# Patient Record
Sex: Female | Born: 1940 | Race: White | Hispanic: No | Marital: Married | Smoking: Never smoker
Health system: Southern US, Community
[De-identification: ages and names within clinical notes are randomized; demographics above are authoritative.]

## PROBLEM LIST (undated history)

## (undated) DIAGNOSIS — M810 Age-related osteoporosis without current pathological fracture: Secondary | ICD-10-CM

## (undated) DIAGNOSIS — I10 Essential (primary) hypertension: Secondary | ICD-10-CM

## (undated) DIAGNOSIS — I4891 Unspecified atrial fibrillation: Secondary | ICD-10-CM

## (undated) DIAGNOSIS — F32A Depression, unspecified: Secondary | ICD-10-CM

## (undated) DIAGNOSIS — F419 Anxiety disorder, unspecified: Secondary | ICD-10-CM

## (undated) DIAGNOSIS — T8859XA Other complications of anesthesia, initial encounter: Secondary | ICD-10-CM

## (undated) DIAGNOSIS — G4730 Sleep apnea, unspecified: Secondary | ICD-10-CM

## (undated) DIAGNOSIS — D649 Anemia, unspecified: Secondary | ICD-10-CM

## (undated) DIAGNOSIS — E785 Hyperlipidemia, unspecified: Secondary | ICD-10-CM

## (undated) DIAGNOSIS — R7303 Prediabetes: Secondary | ICD-10-CM

## (undated) DIAGNOSIS — T4145XA Adverse effect of unspecified anesthetic, initial encounter: Secondary | ICD-10-CM

## (undated) DIAGNOSIS — E119 Type 2 diabetes mellitus without complications: Secondary | ICD-10-CM

## (undated) DIAGNOSIS — Z923 Personal history of irradiation: Secondary | ICD-10-CM

## (undated) DIAGNOSIS — C50919 Malignant neoplasm of unspecified site of unspecified female breast: Secondary | ICD-10-CM

## (undated) DIAGNOSIS — F329 Major depressive disorder, single episode, unspecified: Secondary | ICD-10-CM

## (undated) DIAGNOSIS — Z9889 Other specified postprocedural states: Secondary | ICD-10-CM

## (undated) DIAGNOSIS — R112 Nausea with vomiting, unspecified: Secondary | ICD-10-CM

## (undated) DIAGNOSIS — R0600 Dyspnea, unspecified: Secondary | ICD-10-CM

## (undated) HISTORY — PX: CATARACT EXTRACTION: SUR2

## (undated) HISTORY — PX: BREAST LUMPECTOMY: SHX2

## (undated) HISTORY — PX: JOINT REPLACEMENT: SHX530

## (undated) HISTORY — PX: EYE SURGERY: SHX253

---

## 1998-10-21 DIAGNOSIS — C50919 Malignant neoplasm of unspecified site of unspecified female breast: Secondary | ICD-10-CM

## 1998-10-21 HISTORY — PX: BREAST BIOPSY: SHX20

## 1998-10-21 HISTORY — DX: Malignant neoplasm of unspecified site of unspecified female breast: C50.919

## 2005-01-28 ENCOUNTER — Ambulatory Visit: Payer: Self-pay | Admitting: Oncology

## 2005-02-18 ENCOUNTER — Ambulatory Visit: Payer: Self-pay | Admitting: Oncology

## 2006-01-20 ENCOUNTER — Ambulatory Visit: Payer: Self-pay | Admitting: Unknown Physician Specialty

## 2006-01-27 ENCOUNTER — Ambulatory Visit: Payer: Self-pay | Admitting: Oncology

## 2006-02-24 ENCOUNTER — Ambulatory Visit: Payer: Self-pay | Admitting: Oncology

## 2006-05-05 ENCOUNTER — Ambulatory Visit: Payer: Self-pay | Admitting: Unknown Physician Specialty

## 2007-01-26 ENCOUNTER — Ambulatory Visit: Payer: Self-pay | Admitting: Oncology

## 2007-02-19 ENCOUNTER — Ambulatory Visit: Payer: Self-pay | Admitting: Oncology

## 2007-03-22 ENCOUNTER — Ambulatory Visit: Payer: Self-pay | Admitting: Oncology

## 2007-06-26 ENCOUNTER — Ambulatory Visit: Payer: Self-pay | Admitting: Internal Medicine

## 2007-07-24 ENCOUNTER — Ambulatory Visit: Payer: Self-pay | Admitting: Internal Medicine

## 2008-01-25 ENCOUNTER — Ambulatory Visit: Payer: Self-pay | Admitting: Oncology

## 2008-02-19 ENCOUNTER — Ambulatory Visit: Payer: Self-pay | Admitting: Oncology

## 2008-03-21 ENCOUNTER — Ambulatory Visit: Payer: Self-pay | Admitting: Oncology

## 2009-01-19 ENCOUNTER — Ambulatory Visit: Payer: Self-pay | Admitting: Oncology

## 2009-01-23 ENCOUNTER — Ambulatory Visit: Payer: Self-pay | Admitting: Oncology

## 2009-02-18 ENCOUNTER — Ambulatory Visit: Payer: Self-pay | Admitting: Oncology

## 2009-03-21 ENCOUNTER — Ambulatory Visit: Payer: Self-pay | Admitting: Oncology

## 2010-07-16 ENCOUNTER — Ambulatory Visit: Payer: Self-pay | Admitting: Internal Medicine

## 2011-09-09 ENCOUNTER — Ambulatory Visit: Payer: Self-pay | Admitting: Internal Medicine

## 2012-09-15 ENCOUNTER — Ambulatory Visit: Payer: Self-pay | Admitting: Internal Medicine

## 2012-11-23 ENCOUNTER — Ambulatory Visit: Payer: Self-pay | Admitting: Ophthalmology

## 2012-11-23 LAB — POTASSIUM: Potassium: 3.5 mmol/L

## 2012-12-07 ENCOUNTER — Ambulatory Visit: Payer: Self-pay | Admitting: Ophthalmology

## 2013-01-11 ENCOUNTER — Ambulatory Visit: Payer: Self-pay | Admitting: Ophthalmology

## 2013-01-19 ENCOUNTER — Ambulatory Visit: Payer: Self-pay | Admitting: Ophthalmology

## 2013-09-20 ENCOUNTER — Ambulatory Visit: Payer: Self-pay | Admitting: Family Medicine

## 2014-10-11 ENCOUNTER — Ambulatory Visit: Payer: Self-pay | Admitting: Family Medicine

## 2014-11-21 ENCOUNTER — Ambulatory Visit: Payer: Self-pay | Admitting: Family Medicine

## 2014-12-20 ENCOUNTER — Ambulatory Visit
Admit: 2014-12-20 | Disposition: A | Discharge status: Home / Self Care | Payer: Self-pay | Attending: Family Medicine | Admitting: Family Medicine

## 2015-01-20 ENCOUNTER — Ambulatory Visit
Admit: 2015-01-20 | Disposition: A | Discharge status: Home / Self Care | Payer: Self-pay | Attending: Family Medicine | Admitting: Family Medicine

## 2015-02-10 NOTE — Op Note (Signed)
PATIENT NAME:  Dominique Moore, Dominique Moore MR#:  [id number] DATE OF BIRTH:  06-Sep-1941  DATE OF PROCEDURE:  12/07/2012  PREOPERATIVE DIAGNOSIS: Visually significant cataract of the right eye.   POSTOPERATIVE DIAGNOSIS: Visually significant cataract of the right eye.   OPERATIVE PROCEDURE: Cataract extraction by phacoemulsification with implant of intraocular lens to right eye.   SURGEON: Birder Robson, MD.   ANESTHESIA:  1. Managed anesthesia care.  2. Topical tetracaine drops followed by 2% Xylocaine jelly applied in the preoperative holding area.   COMPLICATIONS: None.   TECHNIQUE:  Stop and chop.   DESCRIPTION OF PROCEDURE: The patient was examined and consented in the preoperative holding area where the aforementioned topical anesthesia was applied to the right eye and then brought back to the Operating Room where the right eye was prepped and draped in the usual sterile ophthalmic fashion and a lid speculum was placed. A paracentesis was created with the side port blade and the anterior chamber was filled with viscoelastic. A near clear corneal incision was performed with the steel keratome. A continuous curvilinear capsulorrhexis was performed with a cystotome followed by the capsulorrhexis forceps. Hydrodissection and hydrodelineation were carried out with BSS on a blunt cannula. The lens was removed in a stop and chop technique and the remaining cortical material was removed with the irrigation-aspiration handpiece. The capsular bag was inflated with viscoelastic and the Tecnis ZCB00 24.5-diopter lens, serial number [id number] was placed in the capsular bag without complication. The remaining viscoelastic was removed from the eye with the irrigation-aspiration handpiece. The wounds were hydrated. The anterior chamber was flushed with Miostat and the eye was inflated to physiologic pressure. 0.1 mL of cefuroxime concentration 10 mg/mL was placed in the anterior chamber. The wounds were found to be  water tight. The eye was dressed with Vigamox. The patient was given protective glasses to wear throughout the day and a shield with which to sleep tonight. The patient was also given drops with which to begin a drop regimen today and will follow-up with me in one day.  ____________________________ Dominique Moore. Dominique Clenney, MD wlp:sb D: 12/07/2012 18:09:45 ET T: 12/08/2012 09:50:35 ET JOB#: [id number]  cc: Dominique Kivett L. Bertran Zeimet, MD, <Dictator> Dominique Moore Bryant Lipps MD ELECTRONICALLY SIGNED 12/14/2012 11:54

## 2015-02-10 NOTE — Op Note (Signed)
PATIENT NAME:  Dominique Moore, Dominique Moore MR#:  [id number] DATE OF BIRTH:  Sep 01, 1941  DATE OF PROCEDURE:  01/19/2013  PREOPERATIVE DIAGNOSIS:  Visually significant cataract of the left eye.   POSTOPERATIVE DIAGNOSIS:  Visually significant cataract of the left eye.   OPERATIVE PROCEDURE:  Cataract extraction by phacoemulsification with implant of intraocular lens to the left eye.   SURGEON:  Birder Robson, MD.   ANESTHESIA:  1. Managed anesthesia care.  2. Topical tetracaine drops followed by 2% Xylocaine jelly applied in the preoperative holding area.   COMPLICATIONS:  None.   TECHNIQUE:  Stop-and-chop.  DESCRIPTION OF PROCEDURE:  The patient was examined and consented in the preoperative holding area where the aforementioned topical anesthesia was applied to the left eye and then brought back to the Operating Room where the left eye was prepped and draped in the usual sterile ophthalmic fashion and a lid speculum was placed. A paracentesis was created with the side port blade and the anterior chamber was filled with viscoelastic. A near clear corneal incision was performed with the steel keratome. A continuous curvilinear capsulorrhexis was performed with a cystotome followed by the capsulorrhexis forceps. Hydrodissection and hydrodelineation were carried out with BSS on a blunt cannula. The lens was removed in a stop-and-chop technique and the remaining cortical material was removed with the irrigation-aspiration handpiece. The capsular bag was inflated with viscoelastic and the Tecnis ZCB00 27.5-diopter lens, serial number [id number] was placed in the capsular bag without complication. The remaining viscoelastic was removed from the eye with the irrigation-aspiration handpiece. The wounds were hydrated. The anterior chamber was flushed with Miostat and the eye was inflated to physiologic pressure. 0.1 mL of cefuroxime concentration 10 mg/mL was placed in the anterior chamber. The wounds were found to be  water tight. The eye was dressed with Vigamox. The patient was given protective glasses to wear throughout the day and a shield with which to sleep tonight. The patient was also given drops with which to begin a drop regimen today and will follow-up with me in one day.    ____________________________ Livingston Diones. Keylin Ferryman, MD wlp:si D: 01/19/2013 22:04:50 ET T: 01/20/2013 00:39:51 ET JOB#: [id number]  cc: Sonda Coppens L. Lanson Randle, MD, <Dictator> Livingston Diones Ardyn Forge MD ELECTRONICALLY SIGNED 01/20/2013 10:30

## 2015-12-28 ENCOUNTER — Other Ambulatory Visit: Payer: Self-pay | Admitting: Internal Medicine

## 2015-12-28 DIAGNOSIS — Z1239 Encounter for other screening for malignant neoplasm of breast: Secondary | ICD-10-CM

## 2016-01-08 ENCOUNTER — Ambulatory Visit
Admission: RE | Admit: 2016-01-08 | Discharge: 2016-01-08 | Disposition: A | Discharge status: Home / Self Care | Payer: Medicare Other | Source: Ambulatory Visit | Attending: Internal Medicine | Admitting: Internal Medicine

## 2016-01-08 ENCOUNTER — Ambulatory Visit: Payer: Self-pay

## 2016-01-08 ENCOUNTER — Other Ambulatory Visit: Payer: Self-pay | Admitting: Internal Medicine

## 2016-01-08 DIAGNOSIS — Z1239 Encounter for other screening for malignant neoplasm of breast: Secondary | ICD-10-CM

## 2016-01-08 DIAGNOSIS — Z1231 Encounter for screening mammogram for malignant neoplasm of breast: Secondary | ICD-10-CM | POA: Insufficient documentation

## 2016-01-08 HISTORY — DX: Malignant neoplasm of unspecified site of unspecified female breast: C50.919

## 2016-02-21 ENCOUNTER — Encounter: Payer: Self-pay | Admitting: *Deleted

## 2016-02-22 ENCOUNTER — Ambulatory Visit: Payer: Medicare Other | Admitting: Anesthesiology

## 2016-02-22 ENCOUNTER — Ambulatory Visit
Admission: RE | Admit: 2016-02-22 | Discharge: 2016-02-22 | Disposition: A | Discharge status: Home / Self Care | Payer: Medicare Other | Source: Ambulatory Visit | Attending: Unknown Physician Specialty | Admitting: Unknown Physician Specialty

## 2016-02-22 ENCOUNTER — Encounter
Admission: RE | Disposition: A | Discharge status: Home / Self Care | Payer: Self-pay | Source: Ambulatory Visit | Attending: Unknown Physician Specialty

## 2016-02-22 DIAGNOSIS — G4730 Sleep apnea, unspecified: Secondary | ICD-10-CM | POA: Insufficient documentation

## 2016-02-22 DIAGNOSIS — Z79899 Other long term (current) drug therapy: Secondary | ICD-10-CM | POA: Insufficient documentation

## 2016-02-22 DIAGNOSIS — F329 Major depressive disorder, single episode, unspecified: Secondary | ICD-10-CM | POA: Insufficient documentation

## 2016-02-22 DIAGNOSIS — K640 First degree hemorrhoids: Secondary | ICD-10-CM | POA: Insufficient documentation

## 2016-02-22 DIAGNOSIS — I10 Essential (primary) hypertension: Secondary | ICD-10-CM | POA: Diagnosis not present

## 2016-02-22 DIAGNOSIS — K621 Rectal polyp: Secondary | ICD-10-CM | POA: Diagnosis not present

## 2016-02-22 DIAGNOSIS — D122 Benign neoplasm of ascending colon: Secondary | ICD-10-CM | POA: Diagnosis not present

## 2016-02-22 DIAGNOSIS — Z1211 Encounter for screening for malignant neoplasm of colon: Secondary | ICD-10-CM | POA: Insufficient documentation

## 2016-02-22 HISTORY — DX: Age-related osteoporosis without current pathological fracture: M81.0

## 2016-02-22 HISTORY — DX: Sleep apnea, unspecified: G47.30

## 2016-02-22 HISTORY — DX: Major depressive disorder, single episode, unspecified: F32.9

## 2016-02-22 HISTORY — DX: Essential (primary) hypertension: I10

## 2016-02-22 HISTORY — DX: Depression, unspecified: F32.A

## 2016-02-22 HISTORY — DX: Hyperlipidemia, unspecified: E78.5

## 2016-02-22 HISTORY — PX: COLONOSCOPY WITH PROPOFOL: SHX5780

## 2016-02-22 SURGERY — COLONOSCOPY WITH PROPOFOL
Anesthesia: General

## 2016-02-22 MED ORDER — PROPOFOL 500 MG/50ML IV EMUL
INTRAVENOUS | Status: DC | PRN
  Administered 2016-02-22: 120 ug/kg/min via INTRAVENOUS

## 2016-02-22 MED ORDER — MIDAZOLAM HCL 2 MG/2ML IJ SOLN
INTRAMUSCULAR | Status: DC | PRN
  Administered 2016-02-22: 1 mg via INTRAVENOUS

## 2016-02-22 MED ORDER — LIDOCAINE HCL (CARDIAC) 20 MG/ML IV SOLN
INTRAVENOUS | Status: DC | PRN
  Administered 2016-02-22: 60 mg via INTRAVENOUS

## 2016-02-22 MED ORDER — SODIUM CHLORIDE 0.9 % IV SOLN
INTRAVENOUS | Status: DC
  Administered 2016-02-22: 1000 mL via INTRAVENOUS

## 2016-02-22 MED ORDER — PROPOFOL 10 MG/ML IV BOLUS
INTRAVENOUS | Status: DC | PRN
  Administered 2016-02-22: 70 mg via INTRAVENOUS

## 2016-02-22 MED ORDER — SODIUM CHLORIDE 0.9 % IV SOLN: INTRAVENOUS | Status: DC

## 2016-02-22 NOTE — H&P (Signed)
Primary Care Physician:  Glendon Axe, MD Primary Gastroenterologist:  Dr. Vira Agar  Pre-Procedure History & Physical: HPI:  Dominique Moore is a 75 y.o. female is here for an colonoscopy.   Past Medical History  Diagnosis Date  . Breast cancer ([place]) 2000    left breast lumpectomy with rad tx  . Depression   . Hypertension   . Osteoporosis   . Hyperlipidemia   . Sleep apnea     Past Surgical History  Procedure Laterality Date  . Breast biopsy Left 2000    positive  . Cataract extraction Bilateral     Prior to Admission medications   Medication Sig Start Date End Date Taking? Authorizing Provider  acetaminophen (TYLENOL) 500 MG tablet Take 500 mg by mouth 2 (two) times daily.   Yes Historical Provider, MD  amLODipine (NORVASC) 2.5 MG tablet Take 2.5 mg by mouth daily.   Yes Historical Provider, MD  atenolol (TENORMIN) 25 MG tablet Take by mouth daily.   Yes Historical Provider, MD  citalopram (CELEXA) 20 MG tablet Take 20 mg by mouth daily.   Yes Historical Provider, MD  COENZYME Q-10 PO Take 10 mg by mouth daily.   Yes Historical Provider, MD  doxazosin (CARDURA) 4 MG tablet Take 4 mg by mouth daily.   Yes Historical Provider, MD  hydrochlorothiazide (MICROZIDE) 12.5 MG capsule Take 12.5 mg by mouth daily.   Yes Historical Provider, MD  losartan (COZAAR) 100 MG tablet Take 100 mg by mouth daily.   Yes Historical Provider, MD  Omega-3 Fatty Acids (FISH OIL PO) Take by mouth.   Yes Historical Provider, MD  omeprazole (PRILOSEC) 20 MG capsule Take 20 mg by mouth daily.   Yes Historical Provider, MD  pravastatin (PRAVACHOL) 20 MG tablet Take 20 mg by mouth at bedtime.   Yes Historical Provider, MD  vitamin B-12 (CYANOCOBALAMIN) 1000 MCG tablet Take 1,000 mcg by mouth daily.   Yes Historical Provider, MD    Allergies as of 02/13/2016  . (Not on File)    Family History  Problem Relation Age of Onset  . Breast cancer Mother 41    Social History   Social History  .  Marital Status: Widowed    Spouse Name: N/A  . Number of Children: N/A  . Years of Education: N/A   Occupational History  . Not on file.   Social History Main Topics  . Smoking status: Never Smoker   . Smokeless tobacco: Not on file  . Alcohol Use: Not on file  . Drug Use: No  . Sexual Activity: Not on file   Other Topics Concern  . Not on file   Social History Narrative    Review of Systems: See HPI, otherwise negative ROS  Physical Exam: BP 167/94 mmHg  Pulse 68  Temp(Src) 97.9 F (36.6 C)  Resp 20  Ht 5\' 3"  (1.6 m)  Wt 99.338 kg (219 lb)  BMI 38.80 kg/m2  SpO2 96% General:   Alert,  pleasant and cooperative in NAD Head:  Normocephalic and atraumatic. Neck:  Supple; no masses or thyromegaly. Lungs:  Clear throughout to auscultation.    Heart:  Regular rate and rhythm. Abdomen:  Soft, nontender and nondistended. Normal bowel sounds, without guarding, and without rebound.   Neurologic:  Alert and  oriented x4;  grossly normal neurologically.  Impression/Plan: Dominique Moore is here for an colonoscopy to be performed for screening colonoscopy  Risks, benefits, limitations, and alternatives regarding  colonoscopy have been reviewed  with the patient.  Questions have been answered.  All parties agreeable.   Gaylyn Cheers, MD  02/22/2016, 3:05 PM

## 2016-02-22 NOTE — Op Note (Signed)
[facility] Gastroenterology Patient Name: Dominique Moore Procedure Date: 02/22/2016 3:08 PM MRN: [id number] Account #: [id number] Date of Birth: 02-08-1941 Admit Type: Outpatient Age: 75 Room: [facility] ENDO ROOM 1 Gender: Female Note Status: Finalized Procedure:            Colonoscopy Indications:          Screening for colorectal malignant neoplasm Providers:            Manya Silvas, MD Referring MD:         Glendon Axe (Referring MD) Medicines:            Propofol per Anesthesia Complications:        No immediate complications. Procedure:            Pre-Anesthesia Assessment:                       - After reviewing the risks and benefits, the patient                        was deemed in satisfactory condition to undergo the                        procedure.                       After obtaining informed consent, the colonoscope was                        passed under direct vision. Throughout the procedure,                        the patient's blood pressure, pulse, and oxygen                        saturations were monitored continuously. The                        Colonoscope was introduced through the anus and                        advanced to the the cecum, identified by appendiceal                        orifice and ileocecal valve. The colonoscopy was                        performed without difficulty. The patient tolerated the                        procedure well. The quality of the bowel preparation                        was good. Findings:      Two sessile polyps were found in the ascending colon. The polyps were       diminutive in size. These polyps were removed with a jumbo cold forceps.       Resection and retrieval were complete.      Three sessile polyps were found in the rectum. The polyps were       diminutive in size. These polyps were removed with a jumbo cold forceps.  Resection and retrieval were complete.      Internal  hemorrhoids were found during endoscopy. The hemorrhoids were       small and Grade I (internal hemorrhoids that do not prolapse).      The exam was otherwise without abnormality. Impression:           - Two diminutive polyps in the ascending colon, removed                        with a jumbo cold forceps. Resected and retrieved.                       - Three diminutive polyps in the rectum, removed with a                        jumbo cold forceps. Resected and retrieved.                       - Internal hemorrhoids.                       - The examination was otherwise normal. Recommendation:       - Await pathology results. Manya Silvas, MD 02/22/2016 3:34:55 PM This report has been signed electronically. Number of Addenda: 0 Note Initiated On: 02/22/2016 3:08 PM Scope Withdrawal Time: 0 hours 11 minutes 35 seconds  Total Procedure Duration: 0 hours 17 minutes 34 seconds       [facility]

## 2016-02-22 NOTE — Anesthesia Postprocedure Evaluation (Signed)
Anesthesia Post Note  Patient: Dominique Moore  Procedure(s) Performed: Procedure(s) (LRB): COLONOSCOPY WITH PROPOFOL (N/A)  Patient location during evaluation: Endoscopy Anesthesia Type: General Level of consciousness: awake and alert Pain management: pain level controlled Vital Signs Assessment: post-procedure vital signs reviewed and stable Respiratory status: spontaneous breathing, nonlabored ventilation, respiratory function stable and patient connected to nasal cannula oxygen Cardiovascular status: blood pressure returned to baseline and stable Postop Assessment: no signs of nausea or vomiting Anesthetic complications: no    Last Vitals:  Filed Vitals:   02/22/16 1557 02/22/16 1607  BP: 153/81 160/76  Pulse: 71 55  Temp:    Resp: 18 12    Last Pain: There were no vitals filed for this visit.               Redding Cloe S

## 2016-02-22 NOTE — Anesthesia Preprocedure Evaluation (Signed)
Anesthesia Evaluation  Patient identified by MRN, date of birth, ID band Patient awake    Reviewed: Allergy & Precautions, NPO status , Patient's Chart, lab work & pertinent test results  History of Anesthesia Complications (+) PONV  Airway Mallampati: I       Dental  (+) Partial Lower   Pulmonary neg pulmonary ROS, sleep apnea ,           Cardiovascular hypertension, Pt. on medications      Neuro/Psych Depression negative neurological ROS     GI/Hepatic negative GI ROS, Neg liver ROS,   Endo/Other  negative endocrine ROS  Renal/GU negative Renal ROS     Musculoskeletal   Abdominal   Peds  Hematology negative hematology ROS (+)   Anesthesia Other Findings   Reproductive/Obstetrics                             Anesthesia Physical Anesthesia Plan  ASA: II  Anesthesia Plan: General   Post-op Pain Management:    Induction: Intravenous  Airway Management Planned:   Additional Equipment:   Intra-op Plan:   Post-operative Plan:   Informed Consent: I have reviewed the patients History and Physical, chart, labs and discussed the procedure including the risks, benefits and alternatives for the proposed anesthesia with the patient or authorized representative who has indicated his/her understanding and acceptance.     Plan Discussed with:   Anesthesia Plan Comments:         Anesthesia Quick Evaluation

## 2016-02-22 NOTE — Transfer of Care (Signed)
Immediate Anesthesia Transfer of Care Note  Patient: Dominique Moore  Procedure(s) Performed: Procedure(s): COLONOSCOPY WITH PROPOFOL (N/A)  Patient Location: PACU  Anesthesia Type:General  Level of Consciousness: sedated  Airway & Oxygen Therapy: Patient Spontanous Breathing and Patient connected to nasal cannula oxygen  Post-op Assessment: Report given to RN  Post vital signs: Reviewed and stable  Last Vitals:  Filed Vitals:   02/22/16 1356 02/22/16 1537  BP: 167/94 120/64  Pulse: 68 62  Temp: 36.6 C 36.1 C  Resp: 20     Last Pain: There were no vitals filed for this visit.       Complications: No apparent anesthesia complications

## 2016-02-23 ENCOUNTER — Encounter: Payer: Self-pay | Admitting: Unknown Physician Specialty

## 2016-02-26 LAB — SURGICAL PATHOLOGY

## 2016-12-12 ENCOUNTER — Other Ambulatory Visit: Payer: Self-pay | Admitting: Internal Medicine

## 2016-12-12 DIAGNOSIS — Z1231 Encounter for screening mammogram for malignant neoplasm of breast: Secondary | ICD-10-CM

## 2017-01-13 ENCOUNTER — Ambulatory Visit
Admission: RE | Admit: 2017-01-13 | Discharge: 2017-01-13 | Disposition: A | Discharge status: Home / Self Care | Payer: Medicare Other | Source: Ambulatory Visit | Attending: Internal Medicine | Admitting: Internal Medicine

## 2017-01-13 DIAGNOSIS — Z1231 Encounter for screening mammogram for malignant neoplasm of breast: Secondary | ICD-10-CM | POA: Insufficient documentation

## 2017-08-13 ENCOUNTER — Encounter
Admission: RE | Admit: 2017-08-13 | Discharge: 2017-08-13 | Disposition: A | Discharge status: Home / Self Care | Payer: Medicare Other | Source: Ambulatory Visit | Attending: Orthopedic Surgery | Admitting: Orthopedic Surgery

## 2017-08-13 DIAGNOSIS — R9431 Abnormal electrocardiogram [ECG] [EKG]: Secondary | ICD-10-CM | POA: Insufficient documentation

## 2017-08-13 DIAGNOSIS — Z01812 Encounter for preprocedural laboratory examination: Secondary | ICD-10-CM | POA: Insufficient documentation

## 2017-08-13 DIAGNOSIS — I10 Essential (primary) hypertension: Secondary | ICD-10-CM | POA: Diagnosis not present

## 2017-08-13 DIAGNOSIS — Z01810 Encounter for preprocedural cardiovascular examination: Secondary | ICD-10-CM | POA: Diagnosis present

## 2017-08-13 DIAGNOSIS — R001 Bradycardia, unspecified: Secondary | ICD-10-CM | POA: Diagnosis not present

## 2017-08-13 HISTORY — DX: Nausea with vomiting, unspecified: R11.2

## 2017-08-13 HISTORY — DX: Adverse effect of unspecified anesthetic, initial encounter: T41.45XA

## 2017-08-13 HISTORY — DX: Other specified postprocedural states: Z98.890

## 2017-08-13 HISTORY — DX: Other complications of anesthesia, initial encounter: T88.59XA

## 2017-08-13 HISTORY — DX: Prediabetes: R73.03

## 2017-08-13 LAB — URINALYSIS, ROUTINE W REFLEX MICROSCOPIC
Bacteria, UA: NONE SEEN
Bilirubin Urine: NEGATIVE
GLUCOSE, UA: NEGATIVE mg/dL
Hgb urine dipstick: NEGATIVE
KETONES UR: NEGATIVE mg/dL
Nitrite: NEGATIVE
PROTEIN: NEGATIVE mg/dL
Specific Gravity, Urine: 1.018 (ref 1.005–1.030)
pH: 5 (ref 5.0–8.0)

## 2017-08-13 LAB — COMPREHENSIVE METABOLIC PANEL
ALT: 19 U/L (ref 14–54)
AST: 21 U/L (ref 15–41)
Albumin: 3.8 g/dL (ref 3.5–5.0)
Alkaline Phosphatase: 67 U/L (ref 38–126)
Anion gap: 9 (ref 5–15)
BUN: 15 mg/dL (ref 6–20)
CO2: 28 mmol/L (ref 22–32)
CREATININE: 0.7 mg/dL (ref 0.44–1.00)
Calcium: 9.1 mg/dL (ref 8.9–10.3)
Chloride: 103 mmol/L (ref 101–111)
Glucose, Bld: 138 mg/dL — ABNORMAL HIGH (ref 65–99)
POTASSIUM: 3.1 mmol/L — AB (ref 3.5–5.1)
SODIUM: 140 mmol/L (ref 135–145)
Total Bilirubin: 0.7 mg/dL (ref 0.3–1.2)
Total Protein: 7.2 g/dL (ref 6.5–8.1)

## 2017-08-13 LAB — PROTIME-INR
INR: 1
PROTHROMBIN TIME: 13.1 s (ref 11.4–15.2)

## 2017-08-13 LAB — TYPE AND SCREEN
ABO/RH(D): O POS
ANTIBODY SCREEN: NEGATIVE

## 2017-08-13 LAB — SEDIMENTATION RATE: SED RATE: 42 mm/h — AB (ref 0–30)

## 2017-08-13 LAB — SURGICAL PCR SCREEN
MRSA, PCR: NEGATIVE
STAPHYLOCOCCUS AUREUS: NEGATIVE

## 2017-08-13 LAB — APTT: APTT: 29 s (ref 24–36)

## 2017-08-13 LAB — C-REACTIVE PROTEIN: CRP: 0.9 mg/dL (ref <1.0)

## 2017-08-13 NOTE — Patient Instructions (Signed)
Your procedure is scheduled on: Wed. 08/27/17 Report to Day Surgery. To find out your arrival time please call [phone] between 1PM - 3PM on Tues. 08/26/17.  Remember: Instructions that are not followed completely may result in serious medical risk, up to and including death, or upon the discretion of your surgeon and anesthesiologist your surgery may need to be rescheduled.     _X__ 1. Do not eat food after midnight the night before your procedure.                 No gum chewing or hard candies. You may drink clear liquids up to 2 hours                 before you are scheduled to arrive for your surgery- DO not drink clear                 liquids within 2 hours of the start of your surgery.                 Clear Liquids include:  water, apple juice without pulp, clear carbohydrate                 drink such as Clearfast of Gartorade, Black Coffee or Tea (Do not add                 anything to coffee or tea).     _X_ 2.  No Alcohol for 24 hours before or after surgery.   ___ 3.  Do Not Smoke or use e-cigarettes For 24 Hours Prior to Your Surgery.                 Do not use any chewable tobacco products for at least 6 hours prior to                 surgery.  ____  4.  Bring all medications with you on the day of surgery if instructed.   ____  5.  Notify your doctor if there is any change in your medical condition      (cold, fever, infections).     Do not wear jewelry, make-up, hairpins, clips or nail polish. Do not wear lotions, powders, or perfumes. You may wear deodorant. Do not shave 48 hours prior to surgery. Men may shave face and neck. Do not bring valuables to the hospital.    [facility] is not responsible for any belongings or valuables.  Contacts, dentures or bridgework may not be worn into surgery. Leave your suitcase in the car. After surgery it may be brought to your room. For patients admitted to the hospital, discharge time is determined by  your treatment team.   Patients discharged the day of surgery will not be allowed to drive home.   Please read over the following fact sheets that you were given:   MRSA Information          _x___ Take these medicines the morning of surgery with A SIP OF WATER:    1. acetaminophen (TYLENOL) 500 MG tablet if needed  2. atenolol (TENORMIN) 25 MG tablet  3. doxazosin (CARDURA) 4 MG tablet  4.  5.  6.  ____ Fleet Enema (as directed)   __x__ Use CHG Soap as directed  ____ Use inhalers on the day of surgery  ____ Stop metformin 2 days prior to surgery    ____ Take 1/2 of usual insulin dose the night before surgery. No insulin  the morning          of surgery.   ____ Stop Coumadin/Plavix/aspirin on   ____ Stop Anti-inflammatories on    __x__ Stop supplements CoENZYME Q-10 PO,Omega-3 Fatty Acids (FISH OIL PO) 08/20/17  ____ Bring C-Pap to the hospital.

## 2017-08-14 LAB — URINE CULTURE
Culture: NO GROWTH
Special Requests: NORMAL

## 2017-08-15 LAB — IGE: IGE (IMMUNOGLOBULIN E), SERUM: 144 [IU]/mL — AB (ref 0–100)

## 2017-08-26 MED ORDER — CLINDAMYCIN PHOSPHATE 900 MG/50ML IV SOLN: 900.0000 mg | INTRAVENOUS | Status: DC

## 2017-08-26 MED ORDER — TRANEXAMIC ACID 1000 MG/10ML IV SOLN
1000.0000 mg | INTRAVENOUS | Status: DC
  Filled 2017-08-26: qty 10

## 2017-08-27 ENCOUNTER — Inpatient Hospital Stay: Payer: Medicare Other | Admitting: Anesthesiology

## 2017-08-27 ENCOUNTER — Encounter: Payer: Self-pay | Admitting: Orthopedic Surgery

## 2017-08-27 ENCOUNTER — Inpatient Hospital Stay
Admission: RE | Admit: 2017-08-27 | Discharge: 2017-08-29 | DRG: 470 | Disposition: A | Discharge status: Home Health | Payer: Medicare Other | Source: Ambulatory Visit | Attending: Orthopedic Surgery | Admitting: Orthopedic Surgery

## 2017-08-27 ENCOUNTER — Encounter
Admission: RE | Disposition: A | Discharge status: Home Health | Payer: Self-pay | Source: Ambulatory Visit | Attending: Orthopedic Surgery

## 2017-08-27 ENCOUNTER — Inpatient Hospital Stay: Payer: Medicare Other

## 2017-08-27 ENCOUNTER — Other Ambulatory Visit: Payer: Self-pay

## 2017-08-27 DIAGNOSIS — I10 Essential (primary) hypertension: Secondary | ICD-10-CM | POA: Diagnosis present

## 2017-08-27 DIAGNOSIS — R7303 Prediabetes: Secondary | ICD-10-CM | POA: Diagnosis present

## 2017-08-27 DIAGNOSIS — G4730 Sleep apnea, unspecified: Secondary | ICD-10-CM | POA: Diagnosis present

## 2017-08-27 DIAGNOSIS — M810 Age-related osteoporosis without current pathological fracture: Secondary | ICD-10-CM | POA: Diagnosis present

## 2017-08-27 DIAGNOSIS — E785 Hyperlipidemia, unspecified: Secondary | ICD-10-CM | POA: Diagnosis present

## 2017-08-27 DIAGNOSIS — Z853 Personal history of malignant neoplasm of breast: Secondary | ICD-10-CM

## 2017-08-27 DIAGNOSIS — F329 Major depressive disorder, single episode, unspecified: Secondary | ICD-10-CM | POA: Diagnosis present

## 2017-08-27 DIAGNOSIS — Z96659 Presence of unspecified artificial knee joint: Secondary | ICD-10-CM

## 2017-08-27 DIAGNOSIS — M25561 Pain in right knee: Secondary | ICD-10-CM | POA: Diagnosis present

## 2017-08-27 DIAGNOSIS — Z79899 Other long term (current) drug therapy: Secondary | ICD-10-CM | POA: Diagnosis not present

## 2017-08-27 DIAGNOSIS — M1711 Unilateral primary osteoarthritis, right knee: Secondary | ICD-10-CM | POA: Diagnosis present

## 2017-08-27 DIAGNOSIS — Z96651 Presence of right artificial knee joint: Secondary | ICD-10-CM

## 2017-08-27 HISTORY — PX: KNEE ARTHROPLASTY: SHX992

## 2017-08-27 LAB — POCT I-STAT 4, (NA,K, GLUC, HGB,HCT)
Glucose, Bld: 132 mg/dL — ABNORMAL HIGH (ref 65–99)
HCT: 36 % (ref 36.0–46.0)
HEMOGLOBIN: 12.2 g/dL (ref 12.0–15.0)
Potassium: 3.6 mmol/L (ref 3.5–5.1)
SODIUM: 140 mmol/L (ref 135–145)

## 2017-08-27 LAB — ABO/RH: ABO/RH(D): O POS

## 2017-08-27 SURGERY — ARTHROPLASTY, KNEE, TOTAL, USING IMAGELESS COMPUTER-ASSISTED NAVIGATION
Anesthesia: Spinal | Site: Knee | Laterality: Right | Wound class: Clean

## 2017-08-27 MED ORDER — MIDAZOLAM HCL 5 MG/5ML IJ SOLN
INTRAMUSCULAR | Status: DC | PRN
  Administered 2017-08-27: 1 mg via INTRAVENOUS

## 2017-08-27 MED ORDER — CITALOPRAM HYDROBROMIDE 20 MG PO TABS
20.0000 mg | ORAL_TABLET | Freq: Every day | ORAL | Status: DC
  Administered 2017-08-28 – 2017-08-29 (×2): 20 mg via ORAL
  Filled 2017-08-27 (×2): qty 1

## 2017-08-27 MED ORDER — CELECOXIB 200 MG PO CAPS
200.0000 mg | ORAL_CAPSULE | Freq: Two times a day (BID) | ORAL | Status: DC
  Administered 2017-08-27 – 2017-08-29 (×4): 200 mg via ORAL
  Filled 2017-08-27 (×4): qty 1

## 2017-08-27 MED ORDER — AMLODIPINE BESYLATE 10 MG PO TABS
10.0000 mg | ORAL_TABLET | Freq: Every day | ORAL | Status: DC
  Administered 2017-08-29: 10 mg via ORAL
  Filled 2017-08-27: qty 1

## 2017-08-27 MED ORDER — SODIUM CHLORIDE 0.9 % IV SOLN
INTRAVENOUS | Status: DC | PRN
  Administered 2017-08-27: 60 mL

## 2017-08-27 MED ORDER — ONDANSETRON HCL 4 MG/2ML IJ SOLN: 4.0000 mg | Freq: Four times a day (QID) | INTRAMUSCULAR | Status: DC | PRN

## 2017-08-27 MED ORDER — ONDANSETRON HCL 4 MG PO TABS: 4.0000 mg | ORAL_TABLET | Freq: Four times a day (QID) | ORAL | Status: DC | PRN

## 2017-08-27 MED ORDER — TRANEXAMIC ACID 1000 MG/10ML IV SOLN
1000.0000 mg | Freq: Once | INTRAVENOUS | Status: AC
  Administered 2017-08-27: 1000 mg via INTRAVENOUS
  Filled 2017-08-27: qty 10

## 2017-08-27 MED ORDER — MEPERIDINE HCL 50 MG/ML IJ SOLN
6.2500 mg | INTRAMUSCULAR | Status: DC | PRN
Start: 2017-08-27 — End: 2017-08-27

## 2017-08-27 MED ORDER — SODIUM CHLORIDE 0.9 % IV SOLN
INTRAVENOUS | Status: DC | PRN
  Administered 2017-08-27: 30 ug/min via INTRAVENOUS

## 2017-08-27 MED ORDER — BISACODYL 10 MG RE SUPP
10.0000 mg | Freq: Every day | RECTAL | Status: DC | PRN
  Administered 2017-08-29: 10 mg via RECTAL
  Filled 2017-08-27: qty 1

## 2017-08-27 MED ORDER — PROPOFOL 500 MG/50ML IV EMUL
INTRAVENOUS | Status: DC | PRN
  Administered 2017-08-27: 50 ug/kg/min via INTRAVENOUS

## 2017-08-27 MED ORDER — PROMETHAZINE HCL 25 MG/ML IJ SOLN: 6.2500 mg | INTRAMUSCULAR | Status: DC | PRN

## 2017-08-27 MED ORDER — PROPOFOL 10 MG/ML IV BOLUS
INTRAVENOUS | Status: DC | PRN
  Administered 2017-08-27 (×2): 20 mg via INTRAVENOUS
  Administered 2017-08-27: 40 mg via INTRAVENOUS

## 2017-08-27 MED ORDER — PROPOFOL 500 MG/50ML IV EMUL
INTRAVENOUS | Status: AC
  Filled 2017-08-27: qty 50

## 2017-08-27 MED ORDER — FENTANYL CITRATE (PF) 100 MCG/2ML IJ SOLN
INTRAMUSCULAR | Status: DC | PRN
  Administered 2017-08-27: 50 ug via INTRAVENOUS

## 2017-08-27 MED ORDER — PANTOPRAZOLE SODIUM 40 MG PO TBEC
40.0000 mg | DELAYED_RELEASE_TABLET | Freq: Two times a day (BID) | ORAL | Status: DC
  Administered 2017-08-27 – 2017-08-29 (×4): 40 mg via ORAL
  Filled 2017-08-27 (×4): qty 1

## 2017-08-27 MED ORDER — NEOMYCIN-POLYMYXIN B GU 40-200000 IR SOLN
Status: AC
  Filled 2017-08-27: qty 20

## 2017-08-27 MED ORDER — TRAMADOL HCL 50 MG PO TABS: 50.0000 mg | ORAL_TABLET | ORAL | Status: DC | PRN

## 2017-08-27 MED ORDER — FERROUS SULFATE 325 (65 FE) MG PO TABS
325.0000 mg | ORAL_TABLET | Freq: Two times a day (BID) | ORAL | Status: DC
  Administered 2017-08-28 – 2017-08-29 (×3): 325 mg via ORAL
  Filled 2017-08-27 (×3): qty 1

## 2017-08-27 MED ORDER — SCOPOLAMINE 1 MG/3DAYS TD PT72
MEDICATED_PATCH | TRANSDERMAL | Status: AC
  Administered 2017-08-27: 1.5 mg
  Filled 2017-08-27: qty 1

## 2017-08-27 MED ORDER — GLYCOPYRROLATE 0.2 MG/ML IJ SOLN
INTRAMUSCULAR | Status: AC
  Filled 2017-08-27: qty 1

## 2017-08-27 MED ORDER — DIPHENHYDRAMINE HCL 12.5 MG/5ML PO ELIX: 12.5000 mg | ORAL_SOLUTION | ORAL | Status: DC | PRN

## 2017-08-27 MED ORDER — METOCLOPRAMIDE HCL 10 MG PO TABS
10.0000 mg | ORAL_TABLET | Freq: Three times a day (TID) | ORAL | Status: DC
  Administered 2017-08-27 – 2017-08-29 (×7): 10 mg via ORAL
  Filled 2017-08-27 (×7): qty 1

## 2017-08-27 MED ORDER — TETRACAINE HCL 1 % IJ SOLN
INTRAMUSCULAR | Status: DC | PRN
  Administered 2017-08-27: 5 mg via INTRASPINAL

## 2017-08-27 MED ORDER — HYDROCHLOROTHIAZIDE 12.5 MG PO CAPS
12.5000 mg | ORAL_CAPSULE | Freq: Every day | ORAL | Status: DC
  Administered 2017-08-29: 12.5 mg via ORAL
  Filled 2017-08-27: qty 1

## 2017-08-27 MED ORDER — SCOPOLAMINE 1 MG/3DAYS TD PT72: 1.0000 | MEDICATED_PATCH | TRANSDERMAL | Status: DC

## 2017-08-27 MED ORDER — BUPIVACAINE HCL (PF) 0.5 % IJ SOLN
INTRAMUSCULAR | Status: DC | PRN
  Administered 2017-08-27: 2.5 mL

## 2017-08-27 MED ORDER — SENNOSIDES-DOCUSATE SODIUM 8.6-50 MG PO TABS
1.0000 | ORAL_TABLET | Freq: Two times a day (BID) | ORAL | Status: DC
  Administered 2017-08-27 – 2017-08-29 (×4): 1 via ORAL
  Filled 2017-08-27 (×4): qty 1

## 2017-08-27 MED ORDER — FLEET ENEMA 7-19 GM/118ML RE ENEM: 1.0000 | ENEMA | Freq: Once | RECTAL | Status: DC | PRN

## 2017-08-27 MED ORDER — CLOTRIMAZOLE 1 % EX CREA
1.0000 "application " | TOPICAL_CREAM | CUTANEOUS | Status: DC | PRN
  Filled 2017-08-27: qty 15

## 2017-08-27 MED ORDER — DOXAZOSIN MESYLATE 4 MG PO TABS
4.0000 mg | ORAL_TABLET | Freq: Every day | ORAL | Status: DC
  Administered 2017-08-28: 4 mg via ORAL
  Filled 2017-08-27 (×2): qty 1

## 2017-08-27 MED ORDER — ACETAMINOPHEN 10 MG/ML IV SOLN
1000.0000 mg | Freq: Four times a day (QID) | INTRAVENOUS | Status: AC
  Administered 2017-08-27 – 2017-08-28 (×3): 1000 mg via INTRAVENOUS
  Filled 2017-08-27 (×4): qty 100

## 2017-08-27 MED ORDER — BUPIVACAINE HCL (PF) 0.25 % IJ SOLN
INTRAMUSCULAR | Status: DC | PRN
  Administered 2017-08-27: 60 mL

## 2017-08-27 MED ORDER — OXYCODONE HCL 5 MG PO TABS
5.0000 mg | ORAL_TABLET | ORAL | Status: DC | PRN
  Administered 2017-08-27 – 2017-08-29 (×5): 5 mg via ORAL
  Filled 2017-08-27 (×7): qty 1

## 2017-08-27 MED ORDER — CEFAZOLIN SODIUM-DEXTROSE 2-3 GM-%(50ML) IV SOLR
INTRAVENOUS | Status: DC | PRN
  Administered 2017-08-27: 2 g via INTRAVENOUS

## 2017-08-27 MED ORDER — OMEGA-3-ACID ETHYL ESTERS 1 G PO CAPS
1.0000 g | ORAL_CAPSULE | Freq: Every day | ORAL | Status: DC
  Administered 2017-08-28 – 2017-08-29 (×2): 1 g via ORAL
  Filled 2017-08-27 (×2): qty 1

## 2017-08-27 MED ORDER — MAGNESIUM HYDROXIDE 400 MG/5ML PO SUSP
30.0000 mL | Freq: Every day | ORAL | Status: DC | PRN
  Administered 2017-08-28 – 2017-08-29 (×2): 30 mL via ORAL
  Filled 2017-08-27 (×2): qty 30

## 2017-08-27 MED ORDER — CHLORHEXIDINE GLUCONATE 4 % EX LIQD: 60.0000 mL | Freq: Once | CUTANEOUS | Status: DC

## 2017-08-27 MED ORDER — ALUM & MAG HYDROXIDE-SIMETH 200-200-20 MG/5ML PO SUSP: 30.0000 mL | ORAL | Status: DC | PRN

## 2017-08-27 MED ORDER — PRAVASTATIN SODIUM 20 MG PO TABS
20.0000 mg | ORAL_TABLET | Freq: Every day | ORAL | Status: DC
  Administered 2017-08-27 – 2017-08-28 (×2): 20 mg via ORAL
  Filled 2017-08-27 (×2): qty 1

## 2017-08-27 MED ORDER — ACETAMINOPHEN 10 MG/ML IV SOLN
INTRAVENOUS | Status: DC | PRN
  Administered 2017-08-27: 1000 mg via INTRAVENOUS

## 2017-08-27 MED ORDER — VITAMIN B-12 1000 MCG PO TABS
1000.0000 ug | ORAL_TABLET | Freq: Every day | ORAL | Status: DC
  Administered 2017-08-28 – 2017-08-29 (×2): 1000 ug via ORAL
  Filled 2017-08-27 (×2): qty 1

## 2017-08-27 MED ORDER — FENTANYL CITRATE (PF) 100 MCG/2ML IJ SOLN
25.0000 ug | INTRAMUSCULAR | Status: DC | PRN
  Administered 2017-08-27 (×4): 25 ug via INTRAVENOUS

## 2017-08-27 MED ORDER — BUPIVACAINE LIPOSOME 1.3 % IJ SUSP
INTRAMUSCULAR | Status: AC
  Filled 2017-08-27: qty 20

## 2017-08-27 MED ORDER — NEOMYCIN-POLYMYXIN B GU 40-200000 IR SOLN
Status: DC | PRN
  Administered 2017-08-27: 14 mL

## 2017-08-27 MED ORDER — ENOXAPARIN SODIUM 30 MG/0.3ML ~~LOC~~ SOLN
30.0000 mg | Freq: Two times a day (BID) | SUBCUTANEOUS | Status: DC
  Administered 2017-08-28 – 2017-08-29 (×3): 30 mg via SUBCUTANEOUS
  Filled 2017-08-27 (×3): qty 0.3

## 2017-08-27 MED ORDER — FAMOTIDINE 20 MG PO TABS: 20.0000 mg | ORAL_TABLET | Freq: Once | ORAL | Status: DC

## 2017-08-27 MED ORDER — SODIUM CHLORIDE 0.9 % IV SOLN
INTRAVENOUS | Status: DC
  Administered 2017-08-27 – 2017-08-28 (×2): via INTRAVENOUS

## 2017-08-27 MED ORDER — FAMOTIDINE 20 MG PO TABS
ORAL_TABLET | ORAL | Status: AC
  Administered 2017-08-27: 20 mg
  Filled 2017-08-27: qty 1

## 2017-08-27 MED ORDER — CLINDAMYCIN PHOSPHATE 600 MG/50ML IV SOLN
600.0000 mg | Freq: Four times a day (QID) | INTRAVENOUS | Status: AC
  Administered 2017-08-28 (×2): 600 mg via INTRAVENOUS
  Filled 2017-08-27 (×4): qty 50

## 2017-08-27 MED ORDER — BUPIVACAINE HCL (PF) 0.25 % IJ SOLN
INTRAMUSCULAR | Status: AC
  Filled 2017-08-27: qty 60

## 2017-08-27 MED ORDER — MONTELUKAST SODIUM 10 MG PO TABS
10.0000 mg | ORAL_TABLET | Freq: Every day | ORAL | Status: DC
  Administered 2017-08-28: 10 mg via ORAL
  Filled 2017-08-27 (×2): qty 1

## 2017-08-27 MED ORDER — MIDAZOLAM HCL 2 MG/2ML IJ SOLN
INTRAMUSCULAR | Status: AC
  Filled 2017-08-27: qty 2

## 2017-08-27 MED ORDER — CLINDAMYCIN PHOSPHATE 900 MG/50ML IV SOLN
INTRAVENOUS | Status: AC
  Filled 2017-08-27: qty 50

## 2017-08-27 MED ORDER — ACETAMINOPHEN 325 MG PO TABS: 650.0000 mg | ORAL_TABLET | ORAL | Status: DC | PRN

## 2017-08-27 MED ORDER — LACTATED RINGERS IV SOLN
INTRAVENOUS | Status: DC
  Administered 2017-08-27 (×2): via INTRAVENOUS

## 2017-08-27 MED ORDER — PHENOL 1.4 % MT LIQD
1.0000 | OROMUCOSAL | Status: DC | PRN
  Filled 2017-08-27: qty 177

## 2017-08-27 MED ORDER — FENTANYL CITRATE (PF) 100 MCG/2ML IJ SOLN
INTRAMUSCULAR | Status: AC
  Administered 2017-08-27: 25 ug via INTRAVENOUS
  Filled 2017-08-27: qty 2

## 2017-08-27 MED ORDER — GLYCOPYRROLATE 0.2 MG/ML IJ SOLN
INTRAMUSCULAR | Status: DC | PRN
  Administered 2017-08-27: 0.2 mg via INTRAVENOUS

## 2017-08-27 MED ORDER — TRANEXAMIC ACID 1000 MG/10ML IV SOLN
INTRAVENOUS | Status: DC | PRN
  Administered 2017-08-27: 1000 mg via INTRAVENOUS

## 2017-08-27 MED ORDER — BUPIVACAINE HCL (PF) 0.5 % IJ SOLN
INTRAMUSCULAR | Status: AC
  Filled 2017-08-27: qty 10

## 2017-08-27 MED ORDER — DEXAMETHASONE SODIUM PHOSPHATE 4 MG/ML IJ SOLN
INTRAMUSCULAR | Status: DC | PRN
  Administered 2017-08-27: 5 mg via INTRAVENOUS

## 2017-08-27 MED ORDER — ATENOLOL 25 MG PO TABS
25.0000 mg | ORAL_TABLET | Freq: Every day | ORAL | Status: DC
  Administered 2017-08-29: 25 mg via ORAL
  Filled 2017-08-27: qty 1

## 2017-08-27 MED ORDER — MENTHOL 3 MG MT LOZG
1.0000 | LOZENGE | OROMUCOSAL | Status: DC | PRN
  Filled 2017-08-27: qty 9

## 2017-08-27 MED ORDER — ACETAMINOPHEN 650 MG RE SUPP: 650.0000 mg | RECTAL | Status: DC | PRN

## 2017-08-27 MED ORDER — FENTANYL CITRATE (PF) 100 MCG/2ML IJ SOLN
INTRAMUSCULAR | Status: AC
  Filled 2017-08-27: qty 2

## 2017-08-27 MED ORDER — SODIUM CHLORIDE 0.9 % IJ SOLN
INTRAMUSCULAR | Status: AC
  Filled 2017-08-27: qty 50

## 2017-08-27 MED ORDER — ACETAMINOPHEN 10 MG/ML IV SOLN
INTRAVENOUS | Status: AC
  Filled 2017-08-27: qty 100

## 2017-08-27 MED ORDER — LOSARTAN POTASSIUM 50 MG PO TABS
100.0000 mg | ORAL_TABLET | Freq: Every day | ORAL | Status: DC
  Administered 2017-08-29: 100 mg via ORAL
  Filled 2017-08-27: qty 2

## 2017-08-27 MED ORDER — OXYCODONE HCL 5 MG PO TABS
10.0000 mg | ORAL_TABLET | ORAL | Status: DC | PRN
  Administered 2017-08-28 – 2017-08-29 (×2): 10 mg via ORAL
  Filled 2017-08-27: qty 2

## 2017-08-27 SURGICAL SUPPLY — 72 items
BATTERY INSTRU NAVIGATION (MISCELLANEOUS) ×12 IMPLANT
BLADE CLIPPER SURG (BLADE) ×3 IMPLANT
BLADE SAW 1 (BLADE) ×3 IMPLANT
BLADE SAW 1/2 (BLADE) ×3 IMPLANT
BLADE SAW 70X12.5 (BLADE) IMPLANT
BONE CEMENT GENTAMICIN (Cement) ×6 IMPLANT
CANISTER SUCT 1200ML W/VALVE (MISCELLANEOUS) ×3 IMPLANT
CANISTER SUCT 3000ML PPV (MISCELLANEOUS) ×6 IMPLANT
CAPT KNEE TOTAL 3 ATTUNE ×3 IMPLANT
CATH TRAY METER 16FR LF (MISCELLANEOUS) ×3 IMPLANT
CEMENT BONE GENTAMICIN 40 (Cement) ×2 IMPLANT
COOLER POLAR GLACIER W/PUMP (MISCELLANEOUS) ×3 IMPLANT
CUFF TOURN 24 STER (MISCELLANEOUS) IMPLANT
CUFF TOURN 30 STER DUAL PORT (MISCELLANEOUS) ×3 IMPLANT
DRAPE SHEET LG 3/4 BI-LAMINATE (DRAPES) ×3 IMPLANT
DRSG DERMACEA 8X12 NADH (GAUZE/BANDAGES/DRESSINGS) ×3 IMPLANT
DRSG OPSITE POSTOP 4X14 (GAUZE/BANDAGES/DRESSINGS) ×3 IMPLANT
DRSG TEGADERM 4X4.75 (GAUZE/BANDAGES/DRESSINGS) ×3 IMPLANT
DURAPREP 26ML APPLICATOR (WOUND CARE) ×6 IMPLANT
ELECT CAUTERY BLADE 6.4 (BLADE) ×3 IMPLANT
ELECT REM PT RETURN 9FT ADLT (ELECTROSURGICAL) ×3
ELECTRODE REM PT RTRN 9FT ADLT (ELECTROSURGICAL) ×1 IMPLANT
EVACUATOR 1/8 PVC DRAIN (DRAIN) ×3 IMPLANT
EX-PIN ORTHOLOCK NAV 4X150 (PIN) ×6 IMPLANT
GLOVE BIOGEL M STRL SZ7.5 (GLOVE) ×6 IMPLANT
GLOVE BIOGEL PI IND STRL 7.0 (GLOVE) ×3 IMPLANT
GLOVE BIOGEL PI IND STRL 9 (GLOVE) ×1 IMPLANT
GLOVE BIOGEL PI INDICATOR 7.0 (GLOVE) ×6
GLOVE BIOGEL PI INDICATOR 9 (GLOVE) ×2
GLOVE BIOGEL PI ORTHO PRO 7.5 (GLOVE) ×6
GLOVE INDICATOR 8.0 STRL GRN (GLOVE) ×3 IMPLANT
GLOVE PI ORTHO PRO STRL 7.5 (GLOVE) ×3 IMPLANT
GLOVE SURG SYN 9.0  PF PI (GLOVE) ×2
GLOVE SURG SYN 9.0 PF PI (GLOVE) ×1 IMPLANT
GOWN STRL REUS W/ TWL LRG LVL3 (GOWN DISPOSABLE) ×3 IMPLANT
GOWN STRL REUS W/TWL 2XL LVL3 (GOWN DISPOSABLE) ×3 IMPLANT
GOWN STRL REUS W/TWL LRG LVL3 (GOWN DISPOSABLE) ×6
HOLDER FOLEY CATH W/STRAP (MISCELLANEOUS) ×3 IMPLANT
HOOD PEEL AWAY FLYTE STAYCOOL (MISCELLANEOUS) ×6 IMPLANT
KIT RM TURNOVER STRD PROC AR (KITS) ×3 IMPLANT
KNIFE SCULPS 14X20 (INSTRUMENTS) ×3 IMPLANT
LABEL OR SOLS (LABEL) ×3 IMPLANT
NDL SAFETY 18GX1.5 (NEEDLE) ×3 IMPLANT
NEEDLE SPNL 20GX3.5 QUINCKE YW (NEEDLE) ×6 IMPLANT
NS IRRIG 500ML POUR BTL (IV SOLUTION) ×3 IMPLANT
PACK TOTAL KNEE (MISCELLANEOUS) ×3 IMPLANT
PAD WRAPON POLAR KNEE (MISCELLANEOUS) IMPLANT
PAD WRAPON POLOR MULTI XL (MISCELLANEOUS) ×1 IMPLANT
PIN DRILL QUICK PACK ×3 IMPLANT
PIN FIXATION 1/8DIA X 3INL (PIN) ×3 IMPLANT
PULSAVAC PLUS IRRIG FAN TIP (DISPOSABLE) ×3
SOL .9 NS 3000ML IRR  AL (IV SOLUTION) ×2
SOL .9 NS 3000ML IRR UROMATIC (IV SOLUTION) ×1 IMPLANT
SOL PREP PVP 2OZ (MISCELLANEOUS) ×3
SOLUTION PREP PVP 2OZ (MISCELLANEOUS) ×1 IMPLANT
SPONGE DRAIN TRACH 4X4 STRL 2S (GAUZE/BANDAGES/DRESSINGS) ×3 IMPLANT
STAPLER SKIN PROX 35W (STAPLE) ×3 IMPLANT
STOCKINETTE IMPERV 14X48 (MISCELLANEOUS) ×3 IMPLANT
STRAP TIBIA SHORT (MISCELLANEOUS) ×3 IMPLANT
SUCTION FRAZIER HANDLE 10FR (MISCELLANEOUS) ×2
SUCTION TUBE FRAZIER 10FR DISP (MISCELLANEOUS) ×1 IMPLANT
SUT VIC AB 0 CT1 36 (SUTURE) ×3 IMPLANT
SUT VIC AB 1 CT1 36 (SUTURE) ×6 IMPLANT
SUT VIC AB 2-0 CT2 27 (SUTURE) ×3 IMPLANT
SYR 20CC LL (SYRINGE) ×3 IMPLANT
SYR 30ML LL (SYRINGE) ×6 IMPLANT
TIP FAN IRRIG PULSAVAC PLUS (DISPOSABLE) ×1 IMPLANT
TOWEL OR 17X26 4PK STRL BLUE (TOWEL DISPOSABLE) ×3 IMPLANT
TOWER CARTRIDGE SMART MIX (DISPOSABLE) ×3 IMPLANT
WRAP-ON POLOR PAD MULTI XL (MISCELLANEOUS) ×1
WRAPON POLAR PAD KNEE (MISCELLANEOUS)
WRAPON POLOR PAD MULTI XL (MISCELLANEOUS) ×2

## 2017-08-27 NOTE — H&P (Signed)
The patient has been re-examined, and the chart reviewed, and there have been no interval changes to the documented history and physical.    The risks, benefits, and alternatives have been discussed at length. The patient expressed understanding of the risks benefits and agreed with plans for surgical intervention.  James P. Hooten, Jr. M.D.    

## 2017-08-27 NOTE — Discharge Instructions (Signed)
°  Instructions after Total Knee Replacement ° ° Adriona Kaney P. Timya Trimmer, Jr., M.D.    ° Dept. of Orthopaedics & Sports Medicine ° [facility] ° [address] ° [place], [place]  [address] ° Phone: [phone]   Fax: [phone] ° °  °DIET: °• Drink plenty of non-alcoholic fluids. °• Resume your normal diet. Include foods high in fiber. ° °ACTIVITY:  °• You may use crutches or a walker with weight-bearing as tolerated, unless instructed otherwise. °• You may be weaned off of the walker or crutches by your Physical Therapist.  °• Do NOT place pillows under the knee. Anything placed under the knee could limit your ability to straighten the knee.   °• Continue doing gentle exercises. Exercising will reduce the pain and swelling, increase motion, and prevent muscle weakness.   °• Please continue to use the TED compression stockings for 6 weeks. You may remove the stockings at night, but should reapply them in the morning. °• Do not drive or operate any equipment until instructed. ° °WOUND CARE:  °• Continue to use the PolarCare or ice packs periodically to reduce pain and swelling. °• You may bathe or shower after the staples are removed at the first office visit following surgery. ° °MEDICATIONS: °• You may resume your regular medications. °• Please take the pain medication as prescribed on the medication. °• Do not take pain medication on an empty stomach. °• You have been given a prescription for a blood thinner (Lovenox or Coumadin). Please take the medication as instructed. (NOTE: After completing a 2 week course of Lovenox, take one Enteric-coated aspirin once a day. This along with elevation will help reduce the possibility of phlebitis in your operated leg.) °• Do not drive or drink alcoholic beverages when taking pain medications. ° °CALL THE OFFICE FOR: °• Temperature above 101 degrees °• Excessive bleeding or drainage on the dressing. °• Excessive swelling, coldness, or paleness of the toes. °• Persistent  nausea and vomiting. ° °FOLLOW-UP:  °• You should have an appointment to return to the office in 10-14 days after surgery. °• Arrangements have been made for continuation of Physical Therapy (either home therapy or outpatient therapy). °  °

## 2017-08-27 NOTE — Anesthesia Post-op Follow-up Note (Signed)
Anesthesia QCDR form completed.        

## 2017-08-27 NOTE — Op Note (Signed)
OPERATIVE NOTE  DATE OF SURGERY:  08/27/2017  PATIENT NAME:  Dominique Moore   DOB: June 13, 1941  MRN: [id number]  PRE-OPERATIVE DIAGNOSIS: Degenerative arthrosis of the right knee, primary  POST-OPERATIVE DIAGNOSIS:  Same  PROCEDURE:  Right total knee arthroplasty using computer-assisted navigation  SURGEON:  Marciano Sequin. M.D.  ASSISTANT:  Vance Peper, PA (present and scrubbed throughout the case, critical for assistance with exposure, retraction, instrumentation, and closure)  ANESTHESIA: spinal  ESTIMATED BLOOD LOSS: 50 mL  FLUIDS REPLACED: 1400 mL of crystalloid  TOURNIQUET TIME: 90 minutes  DRAINS: 2 medium Hemovac drains  SOFT TISSUE RELEASES: Anterior cruciate ligament, posterior cruciate ligament, deep medial collateral ligament, patellofemoral ligament  IMPLANTS UTILIZED: DePuy Attune size 4 posterior stabilized femoral component (cemented), size 4 rotating platform tibial component (cemented), 35 mm medialized dome patella (cemented), and a 5 mm stabilized rotating platform polyethylene insert.  INDICATIONS FOR SURGERY: Dominique Moore is a 76 y.o. year old female with a long history of progressive knee pain. X-rays demonstrated severe degenerative changes in tricompartmental fashion. The patient had not seen any significant improvement despite conservative nonsurgical intervention. After discussion of the risks and benefits of surgical intervention, the patient expressed understanding of the risks benefits and agree with plans for total knee arthroplasty.   The risks, benefits, and alternatives were discussed at length including but not limited to the risks of infection, bleeding, nerve injury, stiffness, blood clots, the need for revision surgery, cardiopulmonary complications, among others, and they were willing to proceed.  PROCEDURE IN DETAIL: The patient was brought into the operating room and, after adequate spinal anesthesia was achieved, a tourniquet was placed on  the patient's upper thigh. The patient's knee and leg were cleaned and prepped with alcohol and DuraPrep and draped in the usual sterile fashion. A "timeout" was performed as per usual protocol. The lower extremity was exsanguinated using an Esmarch, and the tourniquet was inflated to 300 mmHg. An anterior longitudinal incision was made followed by a standard mid vastus approach. The deep fibers of the medial collateral ligament were elevated in a subperiosteal fashion off of the medial flare of the tibia so as to maintain a continuous soft tissue sleeve. The patella was subluxed laterally and the patellofemoral ligament was incised. Inspection of the knee demonstrated severe degenerative changes with full-thickness loss of articular cartilage. Osteophytes were debrided using a rongeur. Anterior and posterior cruciate ligaments were excised. Two 4.0 mm Schanz pins were inserted in the femur and into the tibia for attachment of the array of trackers used for computer-assisted navigation. Hip center was identified using a circumduction technique. Distal landmarks were mapped using the computer. The distal femur and proximal tibia were mapped using the computer. The distal femoral cutting guide was positioned using computer-assisted navigation so as to achieve a 5 distal valgus cut. The femur was sized and it was felt that a size 4 femoral component was appropriate. A size 4 femoral cutting guide was positioned and the anterior cut was performed and verified using the computer. This was followed by completion of the posterior and chamfer cuts. Femoral cutting guide for the central box was then positioned in the center box cut was performed.  Attention was then directed to the proximal tibia. Medial and lateral menisci were excised. The extramedullary tibial cutting guide was positioned using computer-assisted navigation so as to achieve a 0 varus-valgus alignment and 3 posterior slope. The cut was performed and  verified using the computer. The proximal tibia  was sized and it was felt that a size 4 tibial tray was appropriate. Tibial and femoral trials were inserted followed by insertion of a 5 mm polyethylene insert. This allowed for excellent mediolateral soft tissue balancing both in flexion and in full extension. Finally, the patella was cut and prepared so as to accommodate a 35 mm medialized dome patella. A patella trial was placed and the knee was placed through a range of motion with excellent patellar tracking appreciated. The femoral trial was removed after debridement of posterior osteophytes. The central post-hole for the tibial component was reamed followed by insertion of a keel punch. Tibial trials were then removed. Cut surfaces of bone were irrigated with copious amounts of normal saline with antibiotic solution using pulsatile lavage and then suctioned dry. Polymethylmethacrylate cement with gentamicin was prepared in the usual fashion using a vacuum mixer. Cement was applied to the cut surface of the proximal tibia as well as along the undersurface of a size 4 rotating platform tibial component. Tibial component was positioned and impacted into place. Excess cement was removed using [employer] fast streamer. Cement was then applied to the cut surfaces of the femur as well as along the posterior flanges of the size 4 femoral component. The femoral component was positioned and impacted into place. Excess cement was removed using [employer] fast streamer. A 5 mm polyethylene trial was inserted and the knee was brought into full extension with steady axial compression applied. Finally, cement was applied to the backside of a 35 mm medialized dome patella and the patellar component was positioned and patellar clamp applied. Excess cement was removed using [employer] fast streamer. After adequate curing of the cement, the tourniquet was deflated after a total tourniquet time of 90 minutes. Hemostasis was achieved using electrocautery.  The knee was irrigated with copious amounts of normal saline with antibiotic solution using pulsatile lavage and then suctioned dry. 20 mL of 1.3% Exparel and 60 mL of 0.25% Marcaine in 40 mL of normal saline was injected along the posterior capsule, medial and lateral gutters, and along the arthrotomy site. A 5 mm stabilized rotating platform polyethylene insert was inserted and the knee was placed through a range of motion with excellent mediolateral soft tissue balancing appreciated and excellent patellar tracking noted. 2 medium drains were placed in the wound bed and brought out through separate stab incisions. The medial parapatellar portion of the incision was reapproximated using interrupted sutures of #1 Vicryl. Subcutaneous tissue was approximated in layers using first #0 Vicryl followed #2-0 Vicryl. The skin was approximated with skin staples. A sterile dressing was applied.  The patient tolerated the procedure well and was transported to the recovery room in stable condition.    James P. Holley Bouche., M.D.

## 2017-08-27 NOTE — Anesthesia Preprocedure Evaluation (Signed)
Anesthesia Evaluation  Patient identified by MRN, date of birth, ID band Patient awake    Reviewed: Allergy & Precautions, NPO status , Patient's Chart, lab work & pertinent test results  History of Anesthesia Complications (+) PONV and history of anesthetic complications  Airway Mallampati: II  TM Distance: >3 FB Neck ROM: Full    Dental no notable dental hx.    Pulmonary sleep apnea , neg COPD,    breath sounds clear to auscultation- rhonchi (-) wheezing      Cardiovascular hypertension, Pt. on medications (-) CAD, (-) Past MI and (-) Cardiac Stents  Rhythm:Regular Rate:Normal - Systolic murmurs and - Diastolic murmurs    Neuro/Psych PSYCHIATRIC DISORDERS Depression negative neurological ROS     GI/Hepatic negative GI ROS, Neg liver ROS,   Endo/Other  negative endocrine ROSneg diabetes  Renal/GU negative Renal ROS     Musculoskeletal negative musculoskeletal ROS (+)   Abdominal (+) + obese,   Peds  Hematology negative hematology ROS (+)   Anesthesia Other Findings Past Medical History: 2000: Breast cancer ([place])     Comment:  left breast lumpectomy with rad tx No date: Complication of anesthesia No date: Depression No date: Hyperlipidemia No date: Hypertension No date: Osteoporosis No date: PONV (postoperative nausea and vomiting) No date: Pre-diabetes No date: Sleep apnea     Comment:  no CPAP   Reproductive/Obstetrics                             Lab Results  Component Value Date   HGB 12.2 08/27/2017   HCT 36.0 08/27/2017    Anesthesia Physical Anesthesia Plan  ASA: II  Anesthesia Plan: Spinal   Post-op Pain Management:    Induction:   PONV Risk Score and Plan: Propofol infusion, Dexamethasone and Ondansetron  Airway Management Planned: Natural Airway  Additional Equipment:   Intra-op Plan:   Post-operative Plan:   Informed Consent: I have reviewed the  patients History and Physical, chart, labs and discussed the procedure including the risks, benefits and alternatives for the proposed anesthesia with the patient or authorized representative who has indicated his/her understanding and acceptance.   Dental advisory given  Plan Discussed with: CRNA and Anesthesiologist  Anesthesia Plan Comments:         Anesthesia Quick Evaluation

## 2017-08-27 NOTE — Transfer of Care (Signed)
Immediate Anesthesia Transfer of Care Note  Patient: Dominique Moore  Procedure(s) Performed: COMPUTER ASSISTED TOTAL KNEE ARTHROPLASTY (Right Knee)  Patient Location: PACU  Anesthesia Type:Spinal  Level of Consciousness: drowsy  Airway & Oxygen Therapy: Patient Spontanous Breathing and Patient connected to nasal cannula oxygen  Post-op Assessment: Report given to RN and Post -op Vital signs reviewed and stable  Post vital signs: Reviewed and stable  Last Vitals:  Vitals:   08/27/17 1018  BP: 140/77  Pulse: (!) 53  Resp: 16  Temp: 36.7 C  SpO2: 96%    Last Pain:  Vitals:   08/27/17 1018  TempSrc: Tympanic         Complications: No apparent anesthesia complications

## 2017-08-27 NOTE — Anesthesia Procedure Notes (Signed)
Spinal  Patient location during procedure: OR Start time: 08/27/2017 11:28 AM End time: 08/27/2017 11:33 AM Staffing Anesthesiologist: Emmie Niemann, MD Resident/CRNA: Bernardo Heater, CRNA Performed: resident/CRNA  Preanesthetic Checklist Completed: patient identified, site marked, surgical consent, pre-op evaluation, timeout performed, IV checked, risks and benefits discussed and monitors and equipment checked Spinal Block Patient position: sitting Prep: ChloraPrep Patient monitoring: heart rate, continuous pulse ox, blood pressure and cardiac monitor Approach: midline Location: L4-5 Injection technique: single-shot Needle Needle type: Introducer and Pencil-Tip  Needle gauge: 24 G Needle length: 9 cm Additional Notes Negative paresthesia. Negative blood return. Positive free-flowing CSF. Expiration date of kit checked and confirmed. Patient tolerated procedure well, without complications.

## 2017-08-28 ENCOUNTER — Encounter: Payer: Self-pay | Admitting: Orthopedic Surgery

## 2017-08-28 LAB — BASIC METABOLIC PANEL
Anion gap: 7 (ref 5–15)
BUN: 11 mg/dL (ref 6–20)
CALCIUM: 8.3 mg/dL — AB (ref 8.9–10.3)
CO2: 27 mmol/L (ref 22–32)
CREATININE: 0.68 mg/dL (ref 0.44–1.00)
Chloride: 102 mmol/L (ref 101–111)
GFR calc Af Amer: >60 mL/min (ref >60)
GFR calc non Af Amer: >60 mL/min (ref >60)
GLUCOSE: 164 mg/dL — AB (ref 65–99)
Potassium: 3.6 mmol/L (ref 3.5–5.1)
Sodium: 136 mmol/L (ref 135–145)

## 2017-08-28 LAB — CBC
HEMATOCRIT: 34.5 % — AB (ref 35.0–47.0)
Hemoglobin: 11.8 g/dL — ABNORMAL LOW (ref 12.0–16.0)
MCH: 28.3 pg (ref 26.0–34.0)
MCHC: 34.1 g/dL (ref 32.0–36.0)
MCV: 83.1 fL (ref 80.0–100.0)
Platelets: 210 10*3/uL (ref 150–440)
RBC: 4.16 MIL/uL (ref 3.80–5.20)
RDW: 13.5 % (ref 11.5–14.5)
WBC: 9.8 10*3/uL (ref 3.6–11.0)

## 2017-08-28 MED ORDER — ENOXAPARIN SODIUM 40 MG/0.4ML ~~LOC~~ SOLN
40.0000 mg | SUBCUTANEOUS | Status: DC
Start: 2017-08-30 — End: 2017-08-28

## 2017-08-28 MED ORDER — ENOXAPARIN SODIUM 40 MG/0.4ML ~~LOC~~ SOLN: 40.0000 mg | SUBCUTANEOUS | 0 refills | Status: DC

## 2017-08-28 MED ORDER — OXYCODONE HCL 5 MG PO TABS: 5.0000 mg | ORAL_TABLET | ORAL | 0 refills | Status: DC | PRN

## 2017-08-28 MED ORDER — TRAMADOL HCL 50 MG PO TABS: 50.0000 mg | ORAL_TABLET | ORAL | 0 refills | Status: DC | PRN

## 2017-08-28 NOTE — Progress Notes (Signed)
Conversation with Dr. Marry Guan to alert him of patient's request to remove because of irritation. Did not receive last dose of Tylenol or AB. Ok not start new IV. Patient requested no new IV at this time.

## 2017-08-28 NOTE — Care Management (Signed)
Lovenox $112.97. Patient aware and agrees.

## 2017-08-28 NOTE — Evaluation (Signed)
Occupational Therapy Evaluation Patient Details Name: Dominique Moore MRN: [id number] DOB: 07/21/1941 Today's Date: 08/28/2017    History of Present Illness Pt is a 76 yo F with degenerative arthrosis of therightknee who is now s/p elective RLE TKA.  PMH includes HTN, breast CA, depression, HLD, and osteoporosis.    Clinical Impression   Pt is 76 year old female s/p R TKA.  Pt was independent in all ADLs prior to surgery and is eager to return to PLOF.  Pt currently requires minimal assist for LB dressing while in seated position due to pain and limited AROM of R knee. Min guard for mobility with RW. Pt educated in polar care mgt, compression stocking mgt, home/routines modifications, and AE/DME to maximize safety and functional independence. Pt educated in energy conservation strategies to maximize safety and functional independence including activity pacing, work simplification, AE/DME, home/routine modifications, and falls prevention. Handout provided, pt verbalized understanding of all education/training provided.  Pt would benefit from additional instruction in dressing techniques with or without assistive devices for dressing and bathing skills. Will continue to assess for HHOT needs, but do not anticipate any at this time.    Follow Up Recommendations  No OT follow up    Equipment Recommendations  None recommended by OT    Recommendations for Other Services       Precautions / Restrictions Precautions Precautions: Knee;Fall  Restrictions Weight Bearing Restrictions: Yes RLE Weight Bearing: Weight bearing as tolerated Other Position/Activity Restrictions: Patient able to perform Ind RLE SLRs without extensor lag and was steady in standing and during amb without buckling, no KI required      Mobility Bed Mobility Overal bed mobility: Needs Assistance Bed Mobility: Supine to Sit     Supine to sit: Supervision     General bed mobility comments: supervision for bed  mobility  Transfers Overall transfer level: Needs assistance Equipment used: Standard walker Transfers: Sit to/from Stand Sit to Stand: Min guard         General transfer comment: Mod verbal cues for sequencing required    Balance Overall balance assessment: Needs assistance   Sitting balance-Leahy Scale: Normal     Standing balance support: Bilateral upper extremity supported Standing balance-Leahy Scale: Fair                             ADL either performed or assessed with clinical judgement   ADL Overall ADL's : Needs assistance/impaired Eating/Feeding: Set up;Sitting   Grooming: Set up;Sitting   Upper Body Bathing: Set up;Sitting   Lower Body Bathing: Sitting/lateral leans;Minimal assistance Lower Body Bathing Details (indicate cue type and reason): educated in benefits of seated shower once able to get surgical site wet to maximize safety and minimize fatigue, pain, and falls Upper Body Dressing : Set up;Sitting   Lower Body Dressing: Sit to/from stand;Minimal assistance Lower Body Dressing Details (indicate cue type and reason): pt educated in AE for LB dressing from seated position with verbal instruction and visual demonstration. Pt verbalized understanding. Toilet Transfer: BSC;Min guard;RW           Functional mobility during ADLs: Min guard;Rolling walker       Vision Baseline Vision/History: No visual deficits Patient Visual Report: No change from baseline Vision Assessment?: No apparent visual deficits     Perception     Praxis      Pertinent Vitals/Pain Pain Assessment: 0-10 Pain Score: 5  Pain Location: R knee Pain Descriptors /  Indicators: Burning Pain Intervention(s): Limited activity within patient's tolerance;Monitored during session;Premedicated before session;Ice applied     Hand Dominance Right   Extremity/Trunk Assessment Upper Extremity Assessment Upper Extremity Assessment: Overall WFL for tasks assessed    Lower Extremity Assessment Lower Extremity Assessment: Defer to PT evaluation;Generalized weakness;RLE deficits/detail RLE Deficits / Details: R hip flex >/= 3/5, R knee flex/ext NT secondary to pain; sensation to light touch intact RLE: Unable to fully assess due to pain   Cervical / Trunk Assessment Cervical / Trunk Assessment: Normal   Communication Communication Communication: No difficulties   Cognition Arousal/Alertness: Awake/alert Behavior During Therapy: WFL for tasks assessed/performed Overall Cognitive Status: Within Functional Limits for tasks assessed                                     General Comments       Exercises Other Exercises Other Exercises: Pt educated in polar care mgt, compression stocking mgt, home/routines modifications, and AE/DME to maximize safety and functional independence. Pt verbalized understanding. Other Exercises: Pt educated in energy conservation strategies to maximize safety and functional independence including activity pacing, work simplification, AE/DME, home/routine modifications, and falls prevention. Handout provided, pt verbalized understanding.   Shoulder Instructions      Home Living Family/patient expects to be discharged to:: Private residence Living Arrangements: Spouse/significant other Available Help at Discharge: Family;Available 24 hours/day Type of Home: House Home Access: Stairs to enter [employer] of Steps: 5 STE from back door with R/L rails (can't reach both) Entrance Stairs-Rails: Right;Left Home Layout: One level(has basement, but doesn't use)     Bathroom Shower/Tub: Teacher, early years/pre: Handicapped height     Home Equipment: Cane - single point          Prior Functioning/Environment Level of Independence: Independent with assistive device(s)        Comments: Mod Ind with amb in community with SPC and without AD in home, no fall history, Ind with ADLs,  increasingly has had to engage in activity pacing to perform cleaning/yardwork activities due to R knee pain/fatigue        OT Problem List: Decreased strength;Decreased range of motion;Decreased knowledge of use of DME or AE;Decreased activity tolerance;Pain      OT Treatment/Interventions: Self-care/ADL training;Energy conservation;DME and/or AE instruction;Patient/family education    OT Goals(Current goals can be found in the care plan section) Acute Rehab OT Goals Patient Stated Goal: get back to PLOF with less pain OT Goal Formulation: With patient Time For Goal Achievement: 09/11/17 Potential to Achieve Goals: Good ADL Goals Pt Will Perform Lower Body Dressing: sit to/from stand;with adaptive equipment;with modified independence(pt will be modified indep w/ LB dressing using AE if needed.) Pt Will Transfer to Toilet: with supervision;ambulating(Pt will be supervision for comfort height toilet t/f)  OT Frequency: Min 1X/week   Barriers to D/C:            Co-evaluation              AM-PAC PT "6 Clicks" Daily Activity     Outcome Measure Help from another person eating meals?: None Help from another person taking care of personal grooming?: None Help from another person toileting, which includes using toliet, bedpan, or urinal?: A Little Help from another person bathing (including washing, rinsing, drying)?: A Little Help from another person to put on and taking off regular upper body clothing?: None Help from  another person to put on and taking off regular lower body clothing?: A Little 6 Click Score: 21   End of Session Equipment Utilized During Treatment: Gait belt;Rolling walker  Activity Tolerance: Patient tolerated treatment well Patient left: in bed;with call bell/phone within reach;with bed alarm set;with SCD's reapplied;Other (comment)(polar care in place)  OT Visit Diagnosis: Other abnormalities of gait and mobility (R26.89)                Time:  5146-0479 OT Time Calculation (min): 27 min Charges:  OT General Charges $OT Visit: 1 Visit OT Evaluation $OT Eval Low Complexity: 1 Low OT Treatments $Self Care/Home Management : 8-22 mins G-Codes: OT G-codes **NOT FOR INPATIENT CLASS** Functional Assessment Tool Used: AM-PAC 6 Clicks Daily Activity;Clinical judgement Functional Limitation: Self care Self Care Current Status (V8721): At least 20 percent but less than 40 percent impaired, limited or restricted Self Care Goal Status (L8727): At least 1 percent but less than 20 percent impaired, limited or restricted   Jeni Salles, MPH, MS, OTR/L ascom [phone] 08/28/17, 11:46 AM

## 2017-08-28 NOTE — Anesthesia Postprocedure Evaluation (Signed)
Anesthesia Post Note  Patient: Dominique Moore  Procedure(s) Performed: COMPUTER ASSISTED TOTAL KNEE ARTHROPLASTY (Right Knee)  Patient location during evaluation: Nursing Unit Anesthesia Type: Spinal Level of consciousness: oriented and awake and alert Pain management: pain level controlled Vital Signs Assessment: post-procedure vital signs reviewed and stable Respiratory status: spontaneous breathing, respiratory function stable and nonlabored ventilation Cardiovascular status: blood pressure returned to baseline and stable Postop Assessment: no headache, no backache and no apparent nausea or vomiting Anesthetic complications: no     Last Vitals:  Vitals:   08/28/17 0240 08/28/17 0430  BP:  125/63  Pulse: (!) 55 63  Resp:  19  Temp: 36.6 C 36.7 C  SpO2:  97%    Last Pain:  Vitals:   08/28/17 0620  TempSrc:   PainSc: [place]

## 2017-08-28 NOTE — Evaluation (Signed)
Physical Therapy Evaluation Patient Details Name: NILANI HUGILL MRN: [id number] DOB: 15-Apr-1941 Today's Date: 08/28/2017   History of Present Illness  Pt is a 76 yo F with degenerative arthrosis of therightknee who is now s/p elective RLE TKA.  PMH includes HTN, breast CA, depression, HLD, and osteoporosis.     Clinical Impression  Pt presents with deficits in strength, transfers, R knee ROM, mobility, gait, balance, and activity tolerance.  Pt motivated to participate during session.  Pt required significant time and effort during sup to sit but no physical assistance.  Pt able to stand from EOB with CGA with good effort and good stability upon initial stand but did require mod verbal cues for proper sequencing.  Pt able to amb 10' with step-to gait, RW, and CGA with fair stability and cues for proper sequencing.  Pt tended to step past RW wheels with RLE but improved during session.  Pt will benefit from HHPT services upon discharge to safely address above deficits for decreased caregiver assistance and eventual return to PLOF.      Follow Up Recommendations Home health PT    Equipment Recommendations  Rolling walker with 5" wheels    Recommendations for Other Services       Precautions / Restrictions Precautions Precautions: Knee;Fall Precaution Booklet Issued: Yes (comment) Restrictions Weight Bearing Restrictions: Yes RLE Weight Bearing: Weight bearing as tolerated Other Position/Activity Restrictions: Patient able to perform Ind RLE SLRs without extensor lag and was steady in standing and during amb without buckling, no KI required      Mobility  Bed Mobility Overal bed mobility: Needs Assistance Bed Mobility: Supine to Sit     Supine to sit: Supervision     General bed mobility comments: Significant time and effort required with HOB elevated during sup to sit  Transfers Overall transfer level: Needs assistance Equipment used: Standard walker Transfers: Sit  to/from Stand Sit to Stand: Min guard         General transfer comment: Mod verbal cues for sequencing required  Ambulation/Gait Ambulation/Gait assistance: Min guard Ambulation Distance (Feet): 10 Feet Assistive device: Rolling walker (2 wheeled) Gait Pattern/deviations: Step-to pattern   Gait velocity interpretation: Below normal speed for age/gender General Gait Details: Slow cadence with heavy use of BUEs on RW but steady without LOB  Stairs Stairs: (Deferred)          Wheelchair Mobility    Modified Rankin (Stroke Patients Only)       Balance Overall balance assessment: Needs assistance   Sitting balance-Leahy Scale: Normal     Standing balance support: Bilateral upper extremity supported Standing balance-Leahy Scale: Fair                               Pertinent Vitals/Pain Pain Assessment: 0-10 Pain Score: 5  Pain Location: R knee Pain Descriptors / Indicators: Burning Pain Intervention(s): Premedicated before session;Monitored during session;Limited activity within patient's tolerance    Home Living Family/patient expects to be discharged to:: Private residence Living Arrangements: Spouse/significant other Available Help at Discharge: Family;Available 24 hours/day Type of Home: House Home Access: Stairs to enter Entrance Stairs-Rails: Right;Left(Too wide for both) Entrance Stairs-Number of Steps: 5 Home Layout: One level Home Equipment: Cane - single point      Prior Function Level of Independence: Independent with assistive device(s)         Comments: Mod Ind with amb in community with SPC and without AD in  home, no fall history, Ind with ADLs     Hand Dominance   Dominant Hand: Right    Extremity/Trunk Assessment   Upper Extremity Assessment Upper Extremity Assessment: Overall WFL for tasks assessed    Lower Extremity Assessment Lower Extremity Assessment: Generalized weakness;RLE deficits/detail RLE Deficits /  Details: R hip flex >/= 3/5, R knee flex/ext NT secondary to pain; sensation to light touch intact RLE: Unable to fully assess due to pain       Communication   Communication: No difficulties  Cognition Arousal/Alertness: Awake/alert Behavior During Therapy: WFL for tasks assessed/performed Overall Cognitive Status: Within Functional Limits for tasks assessed                                        General Comments      Exercises Total Joint Exercises Ankle Circles/Pumps: AROM;Both;10 reps Quad Sets: Strengthening;Right;5 reps;10 reps Hip ABduction/ADduction: AROM;Right;5 reps Straight Leg Raises: AROM;Right;5 reps Long Arc Quad: AROM;Right;5 reps;10 reps Knee Flexion: AROM;Right;5 reps;10 reps Goniometric ROM: R knee A/AAROM: ext -8/-5 deg, flex 60/65 deg Marching in Standing: AROM;Both;5 reps Other Exercises Other Exercises: TKA exercise booklet reviewed   Assessment/Plan    PT Assessment Patient needs continued PT services  PT Problem List Decreased strength;Decreased range of motion;Decreased activity tolerance;Decreased balance;Decreased knowledge of use of DME;Decreased mobility       PT Treatment Interventions DME instruction;Gait training;Stair training;Functional mobility training;Neuromuscular re-education;Balance training;Therapeutic exercise;Therapeutic activities;Patient/family education    PT Goals (Current goals can be found in the Care Plan section)  Acute Rehab PT Goals Patient Stated Goal: To walk better without feeling like my leg will give way PT Goal Formulation: With patient Time For Goal Achievement: 09/10/17 Potential to Achieve Goals: Good    Frequency BID   Barriers to discharge        Co-evaluation               AM-PAC PT "6 Clicks" Daily Activity  Outcome Measure Difficulty turning over in bed (including adjusting bedclothes, sheets and blankets)?: A Little Difficulty moving from lying on back to sitting on the  side of the bed? : A Lot Difficulty sitting down on and standing up from a chair with arms (e.g., wheelchair, bedside commode, etc,.)?: Unable Help needed moving to and from a bed to chair (including a wheelchair)?: A Little Help needed walking in hospital room?: A Lot Help needed climbing 3-5 steps with a railing? : Total 6 Click Score: 12    End of Session Equipment Utilized During Treatment: Gait belt Activity Tolerance: Patient tolerated treatment well Patient left: Other (comment);with nursing/sitter in room;with call bell/phone within reach(On [facility] with CNA in room) Nurse Communication: Mobility status PT Visit Diagnosis: Other abnormalities of gait and mobility (R26.89);Muscle weakness (generalized) (M62.81)    Time: 2202-5427 PT Time Calculation (min) (ACUTE ONLY): 30 min   Charges:   PT Evaluation $PT Eval Low Complexity: 1 Low PT Treatments $Therapeutic Exercise: 8-22 mins   PT G Codes:   PT G-Codes **NOT FOR INPATIENT CLASS** Functional Assessment Tool Used: AM-PAC 6 Clicks Basic Mobility Functional Limitation: Mobility: Walking and moving around Mobility: Walking and Moving Around Current Status (C6237): At least 60 percent but less than 80 percent impaired, limited or restricted Mobility: Walking and Moving Around Goal Status [phone]): At least 20 percent but less than 40 percent impaired, limited or restricted    D. Scott Athan Casalino  PT, DPT 08/28/17, 11:03 AM

## 2017-08-28 NOTE — Progress Notes (Signed)
Physical Therapy Treatment Patient Details Name: Dominique Moore MRN: [id number] DOB: [birth year] Today's Date: 08/28/2017    History of Present Illness Pt is a 76 yo F with degenerative arthrosis of therightknee who is now s/p elective RLE TKA.  PMH includes HTN, breast CA, depression, HLD, and osteoporosis.     PT Comments    Pt presents with deficits in strength, transfers, mobility, gait, balance, R knee ROM, and activity tolerance.  Pt slowly progressing towards goals with increased amb tolerance with beginning reciprocal gait pattern this session with decreased LLE step length and slow cadence.  Pt continues to be steady during transfers and amb without LOB.  Pt did c/o minimal dizziness during standing with BP taken at 161/68 mmHg and HR/SpO2 WNL.  Anticipate pt will continue to progress with amb tolerance and gait quality and should be appropriate for stair training next session.   Pt will benefit from HHPT services upon discharge to safely address above deficits for decreased caregiver assistance and eventual return to PLOF.     Follow Up Recommendations  Home health PT     Equipment Recommendations  Rolling walker with 5" wheels    Recommendations for Other Services       Precautions / Restrictions Precautions Precautions: Knee;Fall Precaution Booklet Issued: Yes (comment) Restrictions Weight Bearing Restrictions: Yes RLE Weight Bearing: Weight bearing as tolerated Other Position/Activity Restrictions: Patient able to perform Ind RLE SLRs without extensor lag and was steady in standing and during amb without buckling, no KI required    Mobility  Bed Mobility Overal bed mobility: Needs Assistance Bed Mobility: Supine to Sit     Supine to sit: Supervision     General bed mobility comments: Extra time and effort required for bed mob tasks  Transfers Overall transfer level: Needs assistance Equipment used: Rolling walker (2 wheeled) Transfers: Sit to/from  Stand Sit to Stand: Min guard         General transfer comment: Mod verbal cues for sequencing required  Ambulation/Gait Ambulation/Gait assistance: Min guard Ambulation Distance (Feet): 50 Feet Assistive device: Rolling walker (2 wheeled) Gait Pattern/deviations: Step-to pattern;Step-through pattern;Decreased step length - left   Gait velocity interpretation: Below normal speed for age/gender General Gait Details: Progressing towards reciprocal gait with decreased LLE step length.  90 deg R turn practice to prevent twisting on R knee with mod verbal cues for sequencing.    Stairs Stairs: (Deferred)          Wheelchair Mobility    Modified Rankin (Stroke Patients Only)       Balance Overall balance assessment: Needs assistance   Sitting balance-Leahy Scale: Normal     Standing balance support: Bilateral upper extremity supported Standing balance-Leahy Scale: Fair                              Cognition Arousal/Alertness: Awake/alert Behavior During Therapy: WFL for tasks assessed/performed Overall Cognitive Status: Within Functional Limits for tasks assessed                                        Exercises Total Joint Exercises Ankle Circles/Pumps: AROM;Both;10 reps Quad Sets: Strengthening;10 reps;Both;15 reps Hip ABduction/ADduction: AROM;Right;5 reps;10 reps Straight Leg Raises: AROM;Right;5 reps;10 reps Long Arc Quad: AROM;Right;10 reps;15 reps Knee Flexion: AROM;Right;10 reps;15 reps Goniometric ROM: R knee A/AAROM: ext -8/-5 deg, flex 60/65 deg  Marching in Standing: AROM;Both;10 reps Other Exercises Other Exercises: HEP education/review for BLE APs and QS x 10 each 5-6x/day and for seated R knee flex and ext x 10 each 5-6x/day  Other Exercises: Pt educated in energy conservation strategies to maximize safety and functional independence including activity pacing, work simplification, AE/DME, home/routine modifications, and  falls prevention. Handout provided, pt verbalized understanding.    General Comments        Pertinent Vitals/Pain Pain Assessment: 0-10 Pain Score: 6  Pain Location: R knee Pain Descriptors / Indicators: Aching;Operative site guarding Pain Intervention(s): Monitored during session;Limited activity within patient's tolerance;Premedicated before session    [place] expects to be discharged to:: Private residence Living Arrangements: Spouse/significant other Available Help at Discharge: Family;Available 24 hours/day Type of Home: House Home Access: Stairs to enter Entrance Stairs-Rails: Right;Left Home Layout: One level(has basement, but doesn't use) Home Equipment: Cane - single point      Prior Function Level of Independence: Independent with assistive device(s)      Comments: Mod Ind with amb in community with SPC and without AD in home, no fall history, Ind with ADLs, increasingly has had to engage in activity pacing to perform cleaning/yardwork activities due to R knee pain/fatigue   PT Goals (current goals can now be found in the care plan section) Acute Rehab PT Goals Patient Stated Goal: get back to PLOF with less pain PT Goal Formulation: With patient Time For Goal Achievement: 09/10/17 Potential to Achieve Goals: Good Progress towards PT goals: Progressing toward goals    Frequency    BID      PT Plan Current plan remains appropriate    Co-evaluation              AM-PAC PT "6 Clicks" Daily Activity  Outcome Measure  Difficulty turning over in bed (including adjusting bedclothes, sheets and blankets)?: A Little Difficulty moving from lying on back to sitting on the side of the bed? : A Lot Difficulty sitting down on and standing up from a chair with arms (e.g., wheelchair, bedside commode, etc,.)?: Unable Help needed moving to and from a bed to chair (including a wheelchair)?: A Little Help needed walking in hospital room?: A  Lot Help needed climbing 3-5 steps with a railing? : Total 6 Click Score: 12    End of Session Equipment Utilized During Treatment: Gait belt Activity Tolerance: Patient tolerated treatment well Patient left: Other (comment);with call bell/phone within reach;in chair;with chair alarm set;with family/visitor present(Polar care donned to R knee, B heels floated) Nurse Communication: Mobility status PT Visit Diagnosis: Other abnormalities of gait and mobility (R26.89);Muscle weakness (generalized) (M62.81)     Time: 1256-1340 PT Time Calculation (min) (ACUTE ONLY): 44 min  Charges:  $Gait Training: 8-22 mins $Therapeutic Exercise: 8-22 mins $Therapeutic Activity: 8-22 mins                    G Codes:  Functional Assessment Tool Used: AM-PAC 6 Clicks Basic Mobility Functional Limitation: Mobility: Walking and moving around Mobility: Walking and Moving Around Current Status (V7793): At least 60 percent but less than 80 percent impaired, limited or restricted Mobility: Walking and Moving Around Goal Status [phone]): At least 20 percent but less than 40 percent impaired, limited or restricted    D. Scott Maciah Schweigert PT, DPT 08/28/17, 2:07 PM

## 2017-08-28 NOTE — NC FL2 (Signed)
[place] LEVEL OF CARE SCREENING TOOL     IDENTIFICATION  Patient Name: Dominique Moore Birthdate: August 28, 1941 Sex: female Admission Date (Current Location): 08/27/2017  [place] and [place] Number:  Engineering geologist and Address:  [facility], [address] [place], [place] [address]      Provider Number: [id number]  Attending Physician Name and Address:  Dereck Leep, MD  Relative Name and Phone Number:       Current Level of Care: Hospital Recommended Level of Care: [place] Prior Approval Number:    Date Approved/Denied:   PASRR Number: ([id number])  Discharge Plan: SNF    Current Diagnoses: Patient Active Problem List   Diagnosis Date Noted  . S/P total knee arthroplasty 08/27/2017    Orientation RESPIRATION BLADDER Height & Weight     Self, Time, Situation, Place  Normal Continent Weight: 219 lb (99.3 kg) Height:  5\' 4"  (162.6 cm)  BEHAVIORAL SYMPTOMS/MOOD NEUROLOGICAL BOWEL NUTRITION STATUS      Continent Diet(Heart Healthy)  AMBULATORY STATUS COMMUNICATION OF NEEDS Skin   Extensive Assist Verbally Surgical wounds(Incision Right Knee)                       Personal Care Assistance Level of Assistance  Feeding, Bathing, Dressing Bathing Assistance: Limited assistance Feeding assistance: Independent Dressing Assistance: Limited assistance     Functional Limitations Info  Sight, Hearing, Speech Sight Info: Adequate Hearing Info: Adequate Speech Info: Adequate    SPECIAL CARE FACTORS FREQUENCY  PT (By licensed PT), OT (By licensed OT)     PT Frequency: (5) OT Frequency: (5)            Contractures      Additional Factors Info  Code Status, Allergies Code Status Info: (Full Code) Allergies Info: (ACE INHIBITORS, AVELOX MOXIFLOXACIN, CODEINE, PENICILLINS )           Current Medications (08/28/2017):  This is the current hospital active medication list Current  Facility-Administered Medications  Medication Dose Route Frequency Provider Last Rate Last Dose  . 0.9 %  sodium chloride infusion   Intravenous Continuous Hooten, Laurice Record, MD 100 mL/hr at 08/28/17 0548    . acetaminophen (OFIRMEV) IV 1,000 mg  1,000 mg Intravenous Q6H Hooten, Laurice Record, MD   Stopped at 08/28/17 [phone]  . acetaminophen (TYLENOL) tablet 650 mg  650 mg Oral Q4H PRN Hooten, Laurice Record, MD       Or  . acetaminophen (TYLENOL) suppository 650 mg  650 mg Rectal Q4H PRN Hooten, Laurice Record, MD      . alum & mag hydroxide-simeth (MAALOX/MYLANTA) 200-200-20 MG/5ML suspension 30 mL  30 mL Oral Q4H PRN Hooten, Laurice Record, MD      . amLODipine (NORVASC) tablet 10 mg  10 mg Oral Daily Hooten, Laurice Record, MD      . atenolol (TENORMIN) tablet 25 mg  25 mg Oral Daily Hooten, Laurice Record, MD      . bisacodyl (DULCOLAX) suppository 10 mg  10 mg Rectal Daily PRN Hooten, Laurice Record, MD      . celecoxib (CELEBREX) capsule 200 mg  200 mg Oral Q12H Hooten, Laurice Record, MD   200 mg at 08/27/17 2223  . citalopram (CELEXA) tablet 20 mg  20 mg Oral Daily Hooten, Laurice Record, MD      . clindamycin (CLEOCIN) IVPB 600 mg  600 mg Intravenous Q6H Hooten, Laurice Record, MD   Stopped at 08/28/17  55  . clotrimazole (LOTRIMIN) 1 % cream 1 application  1 application Topical PRN Hooten, Laurice Record, MD      . diphenhydrAMINE (BENADRYL) 12.5 MG/5ML elixir 12.5-25 mg  12.5-25 mg Oral Q4H PRN Hooten, Laurice Record, MD      . doxazosin (CARDURA) tablet 4 mg  4 mg Oral Daily Hooten, Laurice Record, MD      . enoxaparin (LOVENOX) injection 30 mg  30 mg Subcutaneous Q12H Hooten, Laurice Record, MD      . ferrous sulfate tablet 325 mg  325 mg Oral BID WC Hooten, Laurice Record, MD      . hydrochlorothiazide (MICROZIDE) capsule 12.5 mg  12.5 mg Oral Daily Hooten, Laurice Record, MD      . losartan (COZAAR) tablet 100 mg  100 mg Oral Daily Hooten, Laurice Record, MD      . magnesium hydroxide (MILK OF MAGNESIA) suspension 30 mL  30 mL Oral Daily PRN Hooten, Laurice Record, MD      . menthol-cetylpyridinium  (CEPACOL) lozenge 3 mg  1 lozenge Oral PRN Hooten, Laurice Record, MD       Or  . phenol (CHLORASEPTIC) mouth spray 1 spray  1 spray Mouth/Throat PRN Hooten, Laurice Record, MD      . metoCLOPramide (REGLAN) tablet 10 mg  10 mg Oral TID AC & HS Hooten, Laurice Record, MD   10 mg at 08/27/17 2223  . montelukast (SINGULAIR) tablet 10 mg  10 mg Oral QHS Hooten, Laurice Record, MD      . omega-3 acid ethyl esters (LOVAZA) capsule 1 g  1 g Oral Daily Hooten, Laurice Record, MD      . ondansetron (ZOFRAN) tablet 4 mg  4 mg Oral Q6H PRN Hooten, Laurice Record, MD       Or  . ondansetron (ZOFRAN) injection 4 mg  4 mg Intravenous Q6H PRN Hooten, Laurice Record, MD      . oxyCODONE (Oxy IR/ROXICODONE) immediate release tablet 10 mg  10 mg Oral Q3H PRN Hooten, Laurice Record, MD      . oxyCODONE (Oxy IR/ROXICODONE) immediate release tablet 5 mg  5 mg Oral Q3H PRN Dereck Leep, MD   5 mg at 08/28/17 0620  . pantoprazole (PROTONIX) EC tablet 40 mg  40 mg Oral BID Dereck Leep, MD   40 mg at 08/27/17 2223  . pravastatin (PRAVACHOL) tablet 20 mg  20 mg Oral QHS Hooten, Laurice Record, MD   20 mg at 08/27/17 2223  . scopolamine (TRANSDERM-SCOP) 1 MG/3DAYS 1.5 mg  1 patch Transdermal Q72H Martha Clan, MD      . senna-docusate (Senokot-S) tablet 1 tablet  1 tablet Oral BID Dereck Leep, MD   1 tablet at 08/27/17 2223  . sodium phosphate (FLEET) 7-19 GM/118ML enema 1 enema  1 enema Rectal Once PRN Hooten, Laurice Record, MD      . traMADol Veatrice Bourbon) tablet 50-100 mg  50-100 mg Oral Q4H PRN Hooten, Laurice Record, MD      . vitamin B-12 (CYANOCOBALAMIN) tablet 1,000 mcg  1,000 mcg Oral Daily Hooten, Laurice Record, MD         Discharge Medications: Please see discharge summary for a list of discharge medications.  Relevant Imaging Results:  Relevant Lab Results:   Additional Information (SSN: [id number])  Smith Mince, Student-Social Work

## 2017-08-28 NOTE — Discharge Summary (Signed)
Physician Discharge Summary  Patient ID: Dominique Moore MRN: [id number] DOB/AGE: 02-26-1941 76 y.o.  Admit date: 08/27/2017 Discharge date: 08/29/2017  Admission Diagnoses:  primary osteoarthritis of right knee   Discharge Diagnoses: Patient Active Problem List   Diagnosis Date Noted  . S/P total knee arthroplasty 08/27/2017    Past Medical History:  Diagnosis Date  . Breast cancer ([place]) 2000   left breast lumpectomy with rad tx  . Complication of anesthesia   . Depression   . Hyperlipidemia   . Hypertension   . Osteoporosis   . PONV (postoperative nausea and vomiting)   . Pre-diabetes   . Sleep apnea    no CPAP     Transfusion: No transfusions during this admission   Consultants (if any):   Discharged Condition: Improved  Hospital Course: Dominique Moore is an 76 y.o. female who was admitted 08/27/2017 with a diagnosis of degenerative arthrosis right knee and went to the operating room on 08/27/2017 and underwent the above named procedures.    Surgeries:Procedure(s): COMPUTER ASSISTED TOTAL KNEE ARTHROPLASTY on 08/27/2017  PRE-OPERATIVE DIAGNOSIS: Degenerative arthrosis of the right knee, primary  POST-OPERATIVE DIAGNOSIS:  Same  PROCEDURE:  Right total knee arthroplasty using computer-assisted navigation  SURGEON:  Marciano Sequin. M.D.  ASSISTANT:  Vance Peper, PA (present and scrubbed throughout the case, critical for assistance with exposure, retraction, instrumentation, and closure)  ANESTHESIA: spinal  ESTIMATED BLOOD LOSS: 50 mL  FLUIDS REPLACED: 1400 mL of crystalloid  TOURNIQUET TIME: 90 minutes  DRAINS: 2 medium Hemovac drains  SOFT TISSUE RELEASES: Anterior cruciate ligament, posterior cruciate ligament, deep medial collateral ligament, patellofemoral ligament  IMPLANTS UTILIZED: DePuy Attune size 4 posterior stabilized femoral component (cemented), size 4 rotating platform tibial component (cemented), 35 mm medialized dome patella  (cemented), and a 5 mm stabilized rotating platform polyethylene insert.  INDICATIONS FOR SURGERY: Dominique Moore is a 76 y.o. year old female with a long history of progressive knee pain. X-rays demonstrated severe degenerative changes in tricompartmental fashion. The patient had not seen any significant improvement despite conservative nonsurgical intervention. After discussion of the risks and benefits of surgical intervention, the patient expressed understanding of the risks benefits and agree with plans for total knee arthroplasty.   The risks, benefits, and alternatives were discussed at length including but not limited to the risks of infection, bleeding, nerve injury, stiffness, blood clots, the need for revision surgery, cardiopulmonary complications, among others, and they were willing to proceed.    Patient tolerated the surgery well. No complications .Patient was taken to PACU where she was stabilized and then transferred to the orthopedic floor.  Patient started on Lovenox 30 mg q 12 hrs. Foot pumps applied bilaterally at 80 mm hgb. Heels elevated off bed with rolled towels. No evidence of DVT. Calves non tender. Negative Homan. Physical therapy started on day #1 for gait training and transfer with OT starting on  day #1 for ADL and assisted devices. Patient has done well with therapy. Ambulated greater than 200 feet upon being discharged.  Patient's IV And Foley were discontinued on day #1 with Hemovac being discontinued on day #2. Dressing was changed on day 2 prior to patient being discharged   She was given perioperative antibiotics:  Anti-infectives (From admission, onward)   Start     Dose/Rate Route Frequency Ordered Stop   08/27/17 1800  clindamycin (CLEOCIN) IVPB 600 mg     600 mg 100 mL/hr over 30 Minutes Intravenous Every 6 hours  08/27/17 1711 08/28/17 1759   08/27/17 0959  clindamycin (CLEOCIN) 900 MG/50ML IVPB    Comments:  Hallaji, Violet   : cabinet override       08/27/17 0959 08/27/17 2214   08/27/17 0600  clindamycin (CLEOCIN) IVPB 900 mg  Status:  Discontinued     900 mg 100 mL/hr over 30 Minutes Intravenous On call to O.R. 08/26/17 2221 08/27/17 1025    .  She was fitted with AV 1 compression foot pump devices, instructed on heel pumps, early ambulation, and fitted with TED stockings bilaterally for DVT prophylaxis.  She benefited maximally from the hospital stay and there were no complications.    Recent vital signs:  Vitals:   08/28/17 0240 08/28/17 0430  BP:  125/63  Pulse: (!) 55 63  Resp:  19  Temp: 97.9 F (36.6 C) 98 F (36.7 C)  SpO2:  97%    Recent laboratory studies:  Lab Results  Component Value Date   HGB 11.8 (L) 08/28/2017   HGB 12.2 08/27/2017   Lab Results  Component Value Date   WBC 9.8 08/28/2017   PLT 210 08/28/2017   Lab Results  Component Value Date   INR 1.00 08/13/2017   Lab Results  Component Value Date   NA 136 08/28/2017   K 3.6 08/28/2017   CL 102 08/28/2017   CO2 27 08/28/2017   BUN 11 08/28/2017   CREATININE 0.68 08/28/2017   GLUCOSE 164 (H) 08/28/2017    Discharge Medications:   Allergies as of 08/28/2017      Reactions   Ace Inhibitors Swelling   Feet swelling   Avelox [moxifloxacin] Swelling   Codeine Nausea And Vomiting   Penicillins Hives      Medication List    STOP taking these medications   FISH OIL PO     TAKE these medications   acetaminophen 500 MG tablet Commonly known as:  TYLENOL Take 500 mg by mouth every 6 (six) hours as needed for moderate pain or headache.   amLODipine 10 MG tablet Commonly known as:  NORVASC Take 10 mg by mouth daily.   atenolol 25 MG tablet Commonly known as:  TENORMIN Take 25 mg by mouth daily.   citalopram 20 MG tablet Commonly known as:  CELEXA Take 20 mg by mouth daily.   clotrimazole 1 % cream Commonly known as:  LOTRIMIN Apply 1 application topically as needed (for hands).   COENZYME Q-10 PO Take 1 capsule by mouth  daily.   doxazosin 4 MG tablet Commonly known as:  CARDURA Take 4 mg by mouth daily.   enoxaparin 40 MG/0.4ML injection Commonly known as:  LOVENOX Inject 0.4 mLs (40 mg total) daily into the skin. Start taking on:  08/30/2017   hydrochlorothiazide 12.5 MG capsule Commonly known as:  MICROZIDE Take 12.5 mg by mouth daily.   losartan 100 MG tablet Commonly known as:  COZAAR Take 100 mg by mouth daily.   montelukast 10 MG tablet Commonly known as:  SINGULAIR Take 10 mg by mouth at bedtime.   oxyCODONE 5 MG immediate release tablet Commonly known as:  Oxy IR/ROXICODONE Take 1 tablet (5 mg total) every 3 (three) hours as needed by mouth for moderate pain ((score 4 to 6)).   pravastatin 20 MG tablet Commonly known as:  PRAVACHOL Take 20 mg by mouth at bedtime.   traMADol 50 MG tablet Commonly known as:  ULTRAM Take 1-2 tablets (50-100 mg total) every 4 (four) hours as needed  by mouth for moderate pain.   vitamin B-12 1000 MCG tablet Commonly known as:  CYANOCOBALAMIN Take 1,000 mcg by mouth daily.            Durable Medical Equipment  (From admission, onward)        Start     Ordered   08/27/17 1712  DME Walker rolling  Once    Question:  Patient needs a walker to treat with the following condition  Answer:  Total knee replacement status   08/27/17 1711   08/27/17 1712  DME Bedside commode  Once    Question:  Patient needs a bedside commode to treat with the following condition  Answer:  Total knee replacement status   08/27/17 1711      Diagnostic Studies: Dg Knee Right Port  Result Date: 08/27/2017 CLINICAL DATA:  Right knee arthroplasty EXAM: PORTABLE RIGHT KNEE - 1-2 VIEW COMPARISON:  None. FINDINGS: There is a total right knee arthroplasty with postop change. Two surgical drains are seen in the suprapatellar pouch. Overlying skin staples are seen anteriorly. No immediate postoperative complications. Ghost tracks are noted of the proximal tibial diaphysis.  IMPRESSION: Postop right total knee arthroplasty without immediate complications. Electronically Signed   By: Ashley Royalty M.D.   On: 08/27/2017 15:29    Disposition: 01-Home or Self Care  Discharge Instructions    Diet - low sodium heart healthy   Complete by:  As directed    Increase activity slowly   Complete by:  As directed       Follow-up Information    Watt Climes, PA On 09/09/2017.   Specialty:  Physician Assistant Why:  at 10:15am Contact information: [place] [address] [phone]        Dereck Leep, MD On 10/09/2017.   Specialty:  Orthopedic Surgery Why:  at 10:00am Contact information: [place] [address] [phone]            Signed: Watt Climes 08/28/2017, 7:29 AM

## 2017-08-28 NOTE — Progress Notes (Signed)
   Subjective: 1 Day Post-Op Procedure(s) (LRB): COMPUTER ASSISTED TOTAL KNEE ARTHROPLASTY (Right) Patient reports pain as moderate.   Patient is well, and has had no acute complaints or problems We will start therapy today. [place] into the bed last night and dangle the right leg. Plan is to go Home after hospital stay. no nausea and no vomiting Patient denies any chest pains or shortness of breath. Complain of some right hip pain. Patient feels is secondary to inactivity and maintained in one position during the night. Objective: Vital signs in last 24 hours: Temp:  [97.1 F (36.2 C)-98.3 F (36.8 C)] 98 F (36.7 C) (11/08 0430) Pulse Rate:  [44-63] 63 (11/08 0430) Resp:  [7-20] 19 (11/08 0430) BP: (119-150)/(63-98) 125/63 (11/08 0430) SpO2:  [95 %-98 %] 97 % (11/08 0430) Weight:  [99.3 kg (219 lb)] 99.3 kg (219 lb) (11/07 1018) Heels are non tender and elevated off the bed using rolled towels Intake/Output from previous day: 11/07 0701 - 11/08 0700 In: 2663.3 [I.V.:2213.3; IV Piggyback:450] Out: 3710 [Urine:2900; Drains:240; Blood:50] Intake/Output this shift: No intake/output data recorded.  Recent Labs    08/27/17 1019 08/28/17 0408  HGB 12.2 11.8*   Recent Labs    08/27/17 1019 08/28/17 0408  WBC  --  9.8  RBC  --  4.16  HCT 36.0 34.5*  PLT  --  210   Recent Labs    08/27/17 1019 08/28/17 0408  NA 140 136  K 3.6 3.6  CL  --  102  CO2  --  27  BUN  --  11  CREATININE  --  0.68  GLUCOSE 132* 164*  CALCIUM  --  8.3*   No results for input(s): LABPT, INR in the last 72 hours.  EXAM General - Patient is Alert, Appropriate and Oriented Extremity - Neurologically intact Neurovascular intact Sensation intact distally Intact pulses distally Dorsiflexion/Plantar flexion intact Compartment soft Dressing - dressing C/D/I Motor Function - intact, moving foot and toes well on exam. Patient able to do straight leg raise with minimal assistance  Past  Medical History:  Diagnosis Date  . Breast cancer ([place]) 2000   left breast lumpectomy with rad tx  . Complication of anesthesia   . Depression   . Hyperlipidemia   . Hypertension   . Osteoporosis   . PONV (postoperative nausea and vomiting)   . Pre-diabetes   . Sleep apnea    no CPAP    Assessment/Plan: 1 Day Post-Op Procedure(s) (LRB): COMPUTER ASSISTED TOTAL KNEE ARTHROPLASTY (Right) Active Problems:   S/P total knee arthroplasty  Estimated body mass index is 37.59 kg/m as calculated from the following:   Height as of this encounter: 5\' 4"  (1.626 m).   Weight as of this encounter: 99.3 kg (219 lb). Advance diet Up with therapy D/C IV fluids Plan for discharge tomorrow Discharge home with home health  Labs: Were reviewed and acceptable DVT Prophylaxis - Lovenox, Foot Pumps and TED hose Weight-Bearing as tolerated to right leg D/C O2 and Pulse OX and try on Room Air the gym working on bowel movement Labs tomorrow morning   Jon R. [place] 08/28/2017, 7:15 AM

## 2017-08-28 NOTE — Progress Notes (Signed)
Clinical Social Worker (CSW) received SNF consult. PT is recommending home health. RN case manager aware of above. Please reconsult if future social work needs arise. CSW signing off.   Marzella Miracle, LCSW [phone] 

## 2017-08-28 NOTE — Care Management Note (Signed)
Case Management Note  Patient Details  Name: Dominique Moore MRN: [id number] Date of Birth: Jul 11, 1941  Subjective/Objective:                  RNCM spoke with patient regarding discharge planning. She hopes to be able to return home with home health PT. She states she has already arranged this with [facility] prior to surgery. She requests a front-wheeled walker. She lives with her husband and he will be able to provide transportation.  [facility] uses [facility] for medications [phone].  Per Dr. Marry Guan he has asked RNCM to call in Lovenox/generic okay 40mg  daily injection for 14 days-no refills.  Action/Plan: Referral to [facility]. Lovenox called in as requested. Rolling walker requested from [place] [facility].  Expected Discharge Date:  08/29/17               Expected Discharge Plan:     In-House Referral:     Discharge planning Services  CM Consult  Post Acute Care Choice:  Durable Medical Equipment, Home Health Choice offered to:  Patient  DME Arranged:  Walker rolling DME Agency:  [place]:  [facility] Agency:     Status of Service:  In process, will continue to follow  If discussed at Long Length of Stay Meetings, dates discussed:    Additional Comments:  Marshell Garfinkel, RN 08/28/2017, 9:39 AM

## 2017-08-29 LAB — CBC
HCT: 32.6 % — ABNORMAL LOW (ref 35.0–47.0)
HEMOGLOBIN: 11.1 g/dL — AB (ref 12.0–16.0)
MCH: 28.2 pg (ref 26.0–34.0)
MCHC: 34.1 g/dL (ref 32.0–36.0)
MCV: 82.8 fL (ref 80.0–100.0)
Platelets: 195 10*3/uL (ref 150–440)
RBC: 3.94 MIL/uL (ref 3.80–5.20)
RDW: 13.9 % (ref 11.5–14.5)
WBC: 9.6 10*3/uL (ref 3.6–11.0)

## 2017-08-29 LAB — BASIC METABOLIC PANEL
Anion gap: 8 (ref 5–15)
BUN: 16 mg/dL (ref 6–20)
CHLORIDE: 101 mmol/L (ref 101–111)
CO2: 26 mmol/L (ref 22–32)
Calcium: 8.4 mg/dL — ABNORMAL LOW (ref 8.9–10.3)
Creatinine, Ser: 0.71 mg/dL (ref 0.44–1.00)
GFR calc non Af Amer: >60 mL/min (ref >60)
Glucose, Bld: 175 mg/dL — ABNORMAL HIGH (ref 65–99)
POTASSIUM: 3.4 mmol/L — AB (ref 3.5–5.1)
SODIUM: 135 mmol/L (ref 135–145)

## 2017-08-29 MED ORDER — POTASSIUM CHLORIDE 20 MEQ PO PACK
40.0000 meq | PACK | Freq: Once | ORAL | Status: AC
  Administered 2017-08-29: 40 meq via ORAL
  Filled 2017-08-29: qty 2

## 2017-08-29 NOTE — Care Management (Signed)
Patient ready for discharge if walker has been delivered by [facility].  I have notified [facility]. No other RNCM needs.

## 2017-08-29 NOTE — Progress Notes (Signed)
Plan of care discussed with patient. Bowel prep given earlier with small bowel movement passed. Pain controlled with PRN medication.

## 2017-08-29 NOTE — Progress Notes (Signed)
Discharge teaching given to patient and spouse with teach back. Scripts given. Dressing change. equipment packed with patient belongings.

## 2017-08-29 NOTE — Progress Notes (Signed)
   Subjective: 2 Days Post-Op Procedure(s) (LRB): COMPUTER ASSISTED TOTAL KNEE ARTHROPLASTY (Right) Patient reports pain as moderate.   Patient is well, and has had no acute complaints or problems Did well with physical therapy yesterday. States that she feels that she can do the lap around the nurse's desk today. Was somewhat nausea yesterday but is okay this morning. Plan is to go Home after hospital stay. no nausea and no vomiting today Patient denies any chest pains or shortness of breath. Objective: Vital signs in last 24 hours: Temp:  [98.3 F (36.8 C)-98.6 F (37 C)] 98.6 F (37 C) (11/08 1952) Pulse Rate:  [55-70] 70 (11/08 1952) Resp:  [16-18] 18 (11/08 1952) BP: (134-161)/(60-72) 134/60 (11/08 1952) SpO2:  [96 %-99 %] 96 % (11/08 1952) well approximated incision Heels are non tender and elevated off the bed using rolled towels Intake/Output from previous day: 11/08 0701 - 11/09 0700 In: 600 [P.O.:600] Out: 300 [Drains:300] Intake/Output this shift: No intake/output data recorded.  Recent Labs    08/27/17 1019 08/28/17 0408 08/29/17 0531  HGB 12.2 11.8* 11.1*   Recent Labs    08/28/17 0408 08/29/17 0531  WBC 9.8 9.6  RBC 4.16 3.94  HCT 34.5* 32.6*  PLT 210 195   Recent Labs    08/28/17 0408 08/29/17 0531  NA 136 135  K 3.6 3.4*  CL 102 101  CO2 27 26  BUN 11 16  CREATININE 0.68 0.71  GLUCOSE 164* 175*  CALCIUM 8.3* 8.4*   No results for input(s): LABPT, INR in the last 72 hours.  EXAM General - Patient is Alert, Appropriate and Oriented Extremity - Neurologically intact Neurovascular intact Sensation intact distally Intact pulses distally Dorsiflexion/Plantar flexion intact No cellulitis present Compartment soft Dressing - scant drainage Motor Function - intact, moving foot and toes well on exam.    Past Medical History:  Diagnosis Date  . Breast cancer ([place]) 2000   left breast lumpectomy with rad tx  . Complication of anesthesia    . Depression   . Hyperlipidemia   . Hypertension   . Osteoporosis   . PONV (postoperative nausea and vomiting)   . Pre-diabetes   . Sleep apnea    no CPAP    Assessment/Plan: 2 Days Post-Op Procedure(s) (LRB): COMPUTER ASSISTED TOTAL KNEE ARTHROPLASTY (Right) Active Problems:   S/P total knee arthroplasty  Estimated body mass index is 37.59 kg/m as calculated from the following:   Height as of this encounter: 5\' 4"  (1.626 m).   Weight as of this encounter: 99.3 kg (219 lb). Up with therapy Discharge home with home health  Labs: Were reviewed. Potassium 3.4. We'll add Klor-Con DVT Prophylaxis - Lovenox, Foot Pumps and TED hose Weight-Bearing as tolerated to right leg Hemovac discontinued on today's visit Please wash operative leg and apply new dressing prior to being discharged. Please give the patient 2 extra honeycomb dressings to take home. Sure TED stockings on both legs prior to being discharged Patient needs to have a bowel movement prior to being discharged. Will also need to do her lap around the nurse's desk and steps.  Jillyn Ledger. [place] 08/29/2017, 7:01 AM

## 2018-02-09 ENCOUNTER — Other Ambulatory Visit: Payer: Self-pay | Admitting: Internal Medicine

## 2018-02-09 DIAGNOSIS — Z1231 Encounter for screening mammogram for malignant neoplasm of breast: Secondary | ICD-10-CM

## 2018-03-02 ENCOUNTER — Ambulatory Visit
Admission: RE | Admit: 2018-03-02 | Discharge: 2018-03-02 | Disposition: A | Discharge status: Home / Self Care | Payer: Medicare Other | Source: Ambulatory Visit | Attending: Internal Medicine | Admitting: Internal Medicine

## 2018-03-02 DIAGNOSIS — Z1231 Encounter for screening mammogram for malignant neoplasm of breast: Secondary | ICD-10-CM | POA: Diagnosis present

## 2018-09-21 DIAGNOSIS — E119 Type 2 diabetes mellitus without complications: Secondary | ICD-10-CM | POA: Insufficient documentation

## 2019-04-08 ENCOUNTER — Other Ambulatory Visit: Payer: Self-pay | Admitting: Internal Medicine

## 2019-04-08 DIAGNOSIS — Z1231 Encounter for screening mammogram for malignant neoplasm of breast: Secondary | ICD-10-CM

## 2019-05-17 ENCOUNTER — Ambulatory Visit
Admission: RE | Admit: 2019-05-17 | Discharge: 2019-05-17 | Disposition: A | Discharge status: Home / Self Care | Payer: Medicare Other | Source: Ambulatory Visit | Attending: Internal Medicine | Admitting: Internal Medicine

## 2019-05-17 DIAGNOSIS — Z1231 Encounter for screening mammogram for malignant neoplasm of breast: Secondary | ICD-10-CM | POA: Insufficient documentation

## 2019-05-19 DIAGNOSIS — Z6841 Body Mass Index (BMI) 40.0 and over, adult: Secondary | ICD-10-CM | POA: Insufficient documentation

## 2020-04-25 ENCOUNTER — Other Ambulatory Visit: Payer: Self-pay | Admitting: Internal Medicine

## 2020-04-25 DIAGNOSIS — Z1231 Encounter for screening mammogram for malignant neoplasm of breast: Secondary | ICD-10-CM

## 2020-05-17 ENCOUNTER — Ambulatory Visit
Admission: RE | Admit: 2020-05-17 | Discharge: 2020-05-17 | Disposition: A | Discharge status: Home / Self Care | Payer: Medicare Other | Source: Ambulatory Visit | Attending: Internal Medicine | Admitting: Internal Medicine

## 2020-05-17 DIAGNOSIS — Z1231 Encounter for screening mammogram for malignant neoplasm of breast: Secondary | ICD-10-CM | POA: Insufficient documentation

## 2020-05-17 HISTORY — DX: Personal history of irradiation: Z92.3

## 2020-09-04 NOTE — Discharge Instructions (Signed)
Instructions after Total Knee Replacement   Dominique Moore, Jr., M.D.     Dept. of Orthopaedics & Sports Medicine  [facility]  [address]  [place], [place]  [address]  Phone: [phone]   Fax: [phone]    DIET: Drink plenty of non-alcoholic fluids. Resume your normal diet. Include foods high in fiber.  ACTIVITY:  You may use crutches or a walker with weight-bearing as tolerated, unless instructed otherwise. You may be weaned off of the walker or crutches by your Physical Therapist.  Do NOT place pillows under the knee. Anything placed under the knee could limit your ability to straighten the knee.   Continue doing gentle exercises. Exercising will reduce the pain and swelling, increase motion, and prevent muscle weakness.   Please continue to use the TED compression stockings for 6 weeks. You may remove the stockings at night, but should reapply them in the morning. Do not drive or operate any equipment until instructed.  WOUND CARE:  Continue to use the PolarCare or ice packs periodically to reduce pain and swelling. You may bathe or shower after the staples are removed at the first office visit following surgery.  MEDICATIONS: You may resume your regular medications. Please take the pain medication as prescribed on the medication. Do not take pain medication on an empty stomach. You have been given a prescription for a blood thinner (Lovenox or Coumadin). Please take the medication as instructed. (NOTE: After completing a 2 week course of Lovenox, take one Enteric-coated aspirin once a day. This along with elevation will help reduce the possibility of phlebitis in your operated leg.) Do not drive or drink alcoholic beverages when taking pain medications.  CALL THE OFFICE FOR: Temperature above 101 degrees Excessive bleeding or drainage on the dressing. Excessive swelling, coldness, or paleness of the toes. Persistent nausea and vomiting.  FOLLOW-UP:  You  should have an appointment to return to the office in 10-14 days after surgery. Arrangements have been made for continuation of Physical Therapy (either home therapy or outpatient therapy).   [facility] Department Directory         [email or web address]       [email or web address]          Cardiology  Appointments: [place] - [phone] [place] - [phone]  Endocrinology  Appointments: [place] - [phone] [place] - [phone]  Gastroenterology  Appointments: [place] - [phone] [place] - [phone]        General Surgery   Appointments: [place] - [phone]  Internal Medicine/Family Medicine  Appointments: [place] - [phone] [place] - [phone] [place] - [phone]  Metabolic and Weigh Loss Surgery  Appointments: [place] - [phone]        Neurology  Appointments: [place] - [phone] [place] - [phone]  Neurosurgery  Appointments: [place] - [phone]  Obstetrics & Gynecology  Appointments: [place] - [phone] [place] - [phone]        Pediatrics  Appointments: [place] - [phone] [place] - [phone]  Physiatry  Appointments: [place] -[phone]  Physical Therapy  Appointments: [place] - [phone] [place] - [phone]        Podiatry  Appointments: [place] - [phone] [place] - [phone]  Pulmonology  Appointments: [place] - [phone]  Rheumatology  Appointments: [place] - [phone]        [place] Location: [facility]  [address] [place], [place]  [address]  [place] Location: [facility] [address] [place], [place]  [address]  [place] Location: [facility] [address] [place], [place]  [address]    

## 2020-09-07 ENCOUNTER — Other Ambulatory Visit: Payer: Self-pay

## 2020-09-07 ENCOUNTER — Encounter
Admission: RE | Admit: 2020-09-07 | Discharge: 2020-09-07 | Disposition: A | Discharge status: Home / Self Care | Payer: Medicare Other | Source: Ambulatory Visit | Attending: Orthopedic Surgery | Admitting: Orthopedic Surgery

## 2020-09-07 DIAGNOSIS — Z01818 Encounter for other preprocedural examination: Secondary | ICD-10-CM | POA: Diagnosis not present

## 2020-09-07 LAB — CBC
HCT: 34 % — ABNORMAL LOW (ref 36.0–46.0)
Hemoglobin: 11.7 g/dL — ABNORMAL LOW (ref 12.0–15.0)
MCH: 28.5 pg (ref 26.0–34.0)
MCHC: 34.4 g/dL (ref 30.0–36.0)
MCV: 82.7 fL (ref 80.0–100.0)
Platelets: 191 10*3/uL (ref 150–400)
RBC: 4.11 MIL/uL (ref 3.87–5.11)
RDW: 13.3 % (ref 11.5–15.5)
WBC: 5.9 10*3/uL (ref 4.0–10.5)
nRBC: 0 % (ref 0.0–0.2)

## 2020-09-07 LAB — URINALYSIS, ROUTINE W REFLEX MICROSCOPIC
Bilirubin Urine: NEGATIVE
Glucose, UA: NEGATIVE mg/dL
Hgb urine dipstick: NEGATIVE
Ketones, ur: NEGATIVE mg/dL
Nitrite: NEGATIVE
Protein, ur: NEGATIVE mg/dL
Specific Gravity, Urine: 1.01 (ref 1.005–1.030)
pH: 7 (ref 5.0–8.0)

## 2020-09-07 LAB — COMPREHENSIVE METABOLIC PANEL
ALT: 19 U/L (ref 0–44)
AST: 21 U/L (ref 15–41)
Albumin: 3.9 g/dL (ref 3.5–5.0)
Alkaline Phosphatase: 69 U/L (ref 38–126)
Anion gap: 9 (ref 5–15)
BUN: 14 mg/dL (ref 8–23)
CO2: 29 mmol/L (ref 22–32)
Calcium: 8.9 mg/dL (ref 8.9–10.3)
Chloride: 101 mmol/L (ref 98–111)
Creatinine, Ser: 0.79 mg/dL (ref 0.44–1.00)
GFR, Estimated: >60 mL/min (ref >60)
Glucose, Bld: 142 mg/dL — ABNORMAL HIGH (ref 70–99)
Potassium: 3 mmol/L — ABNORMAL LOW (ref 3.5–5.1)
Sodium: 139 mmol/L (ref 135–145)
Total Bilirubin: 0.7 mg/dL (ref 0.3–1.2)
Total Protein: 7.5 g/dL (ref 6.5–8.1)

## 2020-09-07 LAB — TYPE AND SCREEN
ABO/RH(D): O POS
Antibody Screen: NEGATIVE

## 2020-09-07 LAB — C-REACTIVE PROTEIN: CRP: 0.9 mg/dL (ref <1.0)

## 2020-09-07 LAB — PROTIME-INR
INR: 1 (ref 0.8–1.2)
Prothrombin Time: 12.3 seconds (ref 11.4–15.2)

## 2020-09-07 LAB — SURGICAL PCR SCREEN
MRSA, PCR: NEGATIVE
Staphylococcus aureus: NEGATIVE

## 2020-09-07 LAB — APTT: aPTT: 28 seconds (ref 24–36)

## 2020-09-07 LAB — SEDIMENTATION RATE: Sed Rate: 47 mm/hr — ABNORMAL HIGH (ref 0–30)

## 2020-09-07 NOTE — Patient Instructions (Addendum)
Your procedure is scheduled on: Wednesday September 20, 2020. Report to Day Surgery inside [facility] 2nd floor. To find out your arrival time please call [phone] between 1PM - 3PM on Tuesday September 19, 2020.  Remember: Instructions that are not followed completely may result in serious medical risk,  up to and including death, or upon the discretion of your surgeon and anesthesiologist your  surgery may need to be rescheduled.     _X__ 1. Do not eat food after midnight the night before your procedure.                 No chewing gum or hard candies. You may drink clear liquids up to 2 hours                 before you are scheduled to arrive for your surgery- DO not drink clear                 liquids within 2 hours of the start of your surgery.                 Clear Liquids include:  water, apple juice without pulp, clear Gatorade, G2 or                  Gatorade Zero (avoid Red/Purple/Blue), Black Coffee or Tea (Do not add                 anything to coffee or tea).  __X__2.   Complete the "Ensure Clear Pre-surgery Clear Carbohydrate Drink" provided to you, 2 hours before arrival. **If you are diabetic you will be provided with an alternative drink, Gatorade Zero or G2.  __X__3.  On the morning of surgery brush your teeth with toothpaste and water, you                may rinse your mouth with mouthwash if you wish.  Do not swallow any toothpaste of mouthwash.     _X__ 4.  No Alcohol for 24 hours before or after surgery.   _X__ 5.  Do Not Smoke or use e-cigarettes For 24 Hours Prior to Your Surgery.                 Do not use any chewable tobacco products for at least 6 hours prior to                 Surgery.  _X__  6.  Do not use any recreational drugs (marijuana, cocaine, heroin, ecstasy, MDMA or other)                For at least one week prior to your surgery.  Combination of these drugs with anesthesia                May have life threatening  results.  __X__  7.  Notify your doctor if there is any change in your medical condition      (cold, fever, infections).     Do not wear jewelry, make-up, hairpins, clips or nail polish. Do not wear lotions, powders, or perfumes. You may wear deodorant. Do not shave 48 hours prior to surgery. Men may shave face and neck. Do not bring valuables to the hospital.    [facility] is not responsible for any belongings or valuables.  Contacts, dentures or bridgework may not be worn into surgery. Leave your suitcase in the car. After surgery it may be brought to your room. For  patients admitted to the hospital, discharge time is determined by your treatment team.   Patients discharged the day of surgery will not be allowed to drive home.   Make arrangements for someone to be with you for the first 24 hours of your Same Day Discharge.    Please read over the following fact sheets that you were given:   Attached sheets on Incentive Spirometry    __X__ Take these medicines the morning of surgery with A SIP OF WATER:    1. atenolol (TENORMIN) 25 MG  2. citalopram (CELEXA) 20   3. doxazosin (CARDURA) 4 MG    ____ Fleet Enema (as directed)   __X__ Use CHG Soap (or wipes) as directed  ____ Use Benzoyl Peroxide Gel as instructed  ____ Use inhalers on the day of surgery  ____ Stop metformin 2 days prior to surgery    ____ Take 1/2 of usual insulin dose the night before surgery. No insulin the morning          of surgery.   ____ Stop Coumadin/Plavix/aspirin   __X__ Stop Anti-inflammatories such as Ibuprofen, Aleve, Advil, naproxen, aspirin and or BC powders.    __X__ Stop supplements until after surgery. Omega-3 Fatty Acids (FISH OIL)    __X__ Do not start any herbal supplements before your procedure.    If you have any questions regarding your pre-procedure instructions,  Please call Pre-admit Testing at [phone].

## 2020-09-07 NOTE — Progress Notes (Signed)
  [facility] Perioperative Services: Pre-Admission/Anesthesia Testing  Abnormal Lab Notification   Date: 09/07/20  Name: Dominique Moore MRN:   [id number]  Re: Abnormal labs noted during PAT appointment   Provider(s) Notified: Dereck Leep, MD Notification mode: Routed and/or faxed via [place] LAB VALUE(S): Lab Results  Component Value Date   K 3.0 (L) 09/07/2020    Notes:  Patient is scheduled for a COMPUTER ASSISTED TOTAL KNEE ARTHROPLASTY (Left Knee) on 09/20/2020.    (+) HYPOkalemia - patient takes daily thiazide diuretic (HCTZ 25 mg) for her HTN.   Estimated Creatinine Clearance: 65.8 mL/min (by C-G formula based on SCr of 0.79 mg/dL).  Will send to surgeon for review and optimization. Order entered to have K+ rechecked on the day of surgery.   This is a Community education officer; no formal response is required.  Honor Loh, MSN, APRN, FNP-C, CEN [facility]  Peri-operative Services Nurse Practitioner Phone: [phone] 09/07/20 12:50 PM

## 2020-09-08 LAB — URINE CULTURE
Culture: <10000 — AB
Special Requests: NORMAL

## 2020-09-09 LAB — IGE: IgE (Immunoglobulin E), Serum: 123 IU/mL (ref 6–495)

## 2020-09-18 ENCOUNTER — Other Ambulatory Visit: Payer: Self-pay

## 2020-09-18 ENCOUNTER — Other Ambulatory Visit
Admission: RE | Admit: 2020-09-18 | Discharge: 2020-09-18 | Disposition: A | Discharge status: Home / Self Care | Payer: Medicare Other | Source: Ambulatory Visit | Attending: Orthopedic Surgery | Admitting: Orthopedic Surgery

## 2020-09-18 DIAGNOSIS — Z20822 Contact with and (suspected) exposure to covid-19: Secondary | ICD-10-CM | POA: Insufficient documentation

## 2020-09-18 DIAGNOSIS — Z01818 Encounter for other preprocedural examination: Secondary | ICD-10-CM | POA: Insufficient documentation

## 2020-09-18 LAB — SARS CORONAVIRUS 2 (TAT 6-24 HRS): SARS Coronavirus 2: NEGATIVE

## 2020-09-19 MED ORDER — LACTATED RINGERS IV SOLN: INTRAVENOUS | Status: DC

## 2020-09-19 MED ORDER — TRANEXAMIC ACID-NACL 1000-0.7 MG/100ML-% IV SOLN: 1000.0000 mg | INTRAVENOUS | Status: DC

## 2020-09-19 MED ORDER — DEXAMETHASONE SODIUM PHOSPHATE 10 MG/ML IJ SOLN: 8.0000 mg | Freq: Once | INTRAMUSCULAR | Status: DC

## 2020-09-19 MED ORDER — CEFAZOLIN SODIUM-DEXTROSE 2-4 GM/100ML-% IV SOLN: 2.0000 g | INTRAVENOUS | Status: DC

## 2020-09-19 MED ORDER — CELECOXIB 200 MG PO CAPS: 400.0000 mg | ORAL_CAPSULE | Freq: Once | ORAL | Status: DC

## 2020-09-19 MED ORDER — CHLORHEXIDINE GLUCONATE 4 % EX LIQD: 60.0000 mL | Freq: Once | CUTANEOUS | Status: DC

## 2020-09-19 MED ORDER — CHLORHEXIDINE GLUCONATE 0.12 % MT SOLN: 15.0000 mL | Freq: Once | OROMUCOSAL | Status: DC

## 2020-09-19 MED ORDER — FAMOTIDINE 20 MG PO TABS: 20.0000 mg | ORAL_TABLET | Freq: Once | ORAL | Status: DC

## 2020-09-19 MED ORDER — ORAL CARE MOUTH RINSE: 15.0000 mL | Freq: Once | OROMUCOSAL | Status: DC

## 2020-09-19 MED ORDER — GABAPENTIN 300 MG PO CAPS: 300.0000 mg | ORAL_CAPSULE | Freq: Once | ORAL | Status: DC

## 2020-09-20 ENCOUNTER — Inpatient Hospital Stay
Admission: RE | Admit: 2020-09-20 | Discharge: 2020-09-22 | DRG: 470 | Disposition: A | Discharge status: Home Health | Payer: Medicare Other | Attending: Orthopedic Surgery | Admitting: Orthopedic Surgery

## 2020-09-20 ENCOUNTER — Other Ambulatory Visit: Payer: Self-pay

## 2020-09-20 ENCOUNTER — Inpatient Hospital Stay: Payer: Medicare Other | Admitting: Urgent Care

## 2020-09-20 ENCOUNTER — Encounter: Payer: Self-pay | Admitting: Orthopedic Surgery

## 2020-09-20 ENCOUNTER — Inpatient Hospital Stay: Payer: Medicare Other

## 2020-09-20 ENCOUNTER — Encounter
Admission: RE | Disposition: A | Discharge status: Home Health | Payer: Self-pay | Source: Home / Self Care | Attending: Orthopedic Surgery

## 2020-09-20 DIAGNOSIS — Z9841 Cataract extraction status, right eye: Secondary | ICD-10-CM | POA: Diagnosis not present

## 2020-09-20 DIAGNOSIS — Z79899 Other long term (current) drug therapy: Secondary | ICD-10-CM | POA: Diagnosis not present

## 2020-09-20 DIAGNOSIS — Z880 Allergy status to penicillin: Secondary | ICD-10-CM | POA: Diagnosis not present

## 2020-09-20 DIAGNOSIS — Z803 Family history of malignant neoplasm of breast: Secondary | ICD-10-CM | POA: Diagnosis not present

## 2020-09-20 DIAGNOSIS — Z923 Personal history of irradiation: Secondary | ICD-10-CM

## 2020-09-20 DIAGNOSIS — M1712 Unilateral primary osteoarthritis, left knee: Secondary | ICD-10-CM | POA: Diagnosis present

## 2020-09-20 DIAGNOSIS — Z888 Allergy status to other drugs, medicaments and biological substances status: Secondary | ICD-10-CM

## 2020-09-20 DIAGNOSIS — Z9012 Acquired absence of left breast and nipple: Secondary | ICD-10-CM | POA: Diagnosis not present

## 2020-09-20 DIAGNOSIS — Z885 Allergy status to narcotic agent status: Secondary | ICD-10-CM | POA: Diagnosis not present

## 2020-09-20 DIAGNOSIS — Z96651 Presence of right artificial knee joint: Secondary | ICD-10-CM | POA: Diagnosis present

## 2020-09-20 DIAGNOSIS — Z853 Personal history of malignant neoplasm of breast: Secondary | ICD-10-CM

## 2020-09-20 DIAGNOSIS — M25562 Pain in left knee: Secondary | ICD-10-CM | POA: Diagnosis present

## 2020-09-20 DIAGNOSIS — Z881 Allergy status to other antibiotic agents status: Secondary | ICD-10-CM | POA: Diagnosis not present

## 2020-09-20 DIAGNOSIS — M810 Age-related osteoporosis without current pathological fracture: Secondary | ICD-10-CM | POA: Diagnosis present

## 2020-09-20 DIAGNOSIS — Z96659 Presence of unspecified artificial knee joint: Secondary | ICD-10-CM

## 2020-09-20 DIAGNOSIS — Z8249 Family history of ischemic heart disease and other diseases of the circulatory system: Secondary | ICD-10-CM

## 2020-09-20 DIAGNOSIS — G4730 Sleep apnea, unspecified: Secondary | ICD-10-CM | POA: Diagnosis present

## 2020-09-20 DIAGNOSIS — R7303 Prediabetes: Secondary | ICD-10-CM | POA: Diagnosis present

## 2020-09-20 DIAGNOSIS — E785 Hyperlipidemia, unspecified: Secondary | ICD-10-CM | POA: Diagnosis present

## 2020-09-20 DIAGNOSIS — I10 Essential (primary) hypertension: Secondary | ICD-10-CM | POA: Diagnosis present

## 2020-09-20 DIAGNOSIS — M25762 Osteophyte, left knee: Secondary | ICD-10-CM | POA: Diagnosis present

## 2020-09-20 DIAGNOSIS — Z9842 Cataract extraction status, left eye: Secondary | ICD-10-CM | POA: Diagnosis not present

## 2020-09-20 DIAGNOSIS — F32A Depression, unspecified: Secondary | ICD-10-CM | POA: Diagnosis present

## 2020-09-20 DIAGNOSIS — Z20822 Contact with and (suspected) exposure to covid-19: Secondary | ICD-10-CM | POA: Diagnosis present

## 2020-09-20 HISTORY — PX: KNEE ARTHROPLASTY: SHX992

## 2020-09-20 LAB — POCT I-STAT, CHEM 8
BUN: 12 mg/dL (ref 8–23)
Calcium, Ion: 1.25 mmol/L (ref 1.15–1.40)
Chloride: 100 mmol/L (ref 98–111)
Creatinine, Ser: 0.7 mg/dL (ref 0.44–1.00)
Glucose, Bld: 174 mg/dL — ABNORMAL HIGH (ref 70–99)
HCT: 36 % (ref 36.0–46.0)
Hemoglobin: 12.2 g/dL (ref 12.0–15.0)
Potassium: 3.5 mmol/L (ref 3.5–5.1)
Sodium: 139 mmol/L (ref 135–145)
TCO2: 27 mmol/L (ref 22–32)

## 2020-09-20 LAB — GLUCOSE, CAPILLARY
Glucose-Capillary: 137 mg/dL — ABNORMAL HIGH (ref 70–99)
Glucose-Capillary: 240 mg/dL — ABNORMAL HIGH (ref 70–99)
Glucose-Capillary: 181 mg/dL — ABNORMAL HIGH (ref 70–99)

## 2020-09-20 SURGERY — ARTHROPLASTY, KNEE, TOTAL, USING IMAGELESS COMPUTER-ASSISTED NAVIGATION
Anesthesia: Spinal | Site: Knee | Laterality: Left

## 2020-09-20 MED ORDER — PHENOL 1.4 % MT LIQD
1.0000 | OROMUCOSAL | Status: DC | PRN
  Filled 2020-09-20: qty 177

## 2020-09-20 MED ORDER — ALUM & MAG HYDROXIDE-SIMETH 200-200-20 MG/5ML PO SUSP: 30.0000 mL | ORAL | Status: DC | PRN

## 2020-09-20 MED ORDER — LOSARTAN POTASSIUM 50 MG PO TABS
100.0000 mg | ORAL_TABLET | Freq: Every day | ORAL | Status: DC
  Administered 2020-09-21: 100 mg via ORAL
  Filled 2020-09-20 (×2): qty 2

## 2020-09-20 MED ORDER — ONDANSETRON HCL 4 MG/2ML IJ SOLN: 4.0000 mg | Freq: Once | INTRAMUSCULAR | Status: DC | PRN

## 2020-09-20 MED ORDER — TRANEXAMIC ACID-NACL 1000-0.7 MG/100ML-% IV SOLN: 1000.0000 mg | Freq: Once | INTRAVENOUS | Status: AC

## 2020-09-20 MED ORDER — SURGIPHOR WOUND IRRIGATION SYSTEM - OPTIME
TOPICAL | Status: DC | PRN
  Administered 2020-09-20: 450 mL via TOPICAL

## 2020-09-20 MED ORDER — ATENOLOL 25 MG PO TABS
25.0000 mg | ORAL_TABLET | Freq: Every day | ORAL | Status: DC
  Administered 2020-09-21 – 2020-09-22 (×2): 25 mg via ORAL
  Filled 2020-09-20 (×2): qty 1

## 2020-09-20 MED ORDER — ONDANSETRON HCL 4 MG/2ML IJ SOLN: 4.0000 mg | Freq: Four times a day (QID) | INTRAMUSCULAR | Status: DC | PRN

## 2020-09-20 MED ORDER — CEFAZOLIN SODIUM-DEXTROSE 2-3 GM-%(50ML) IV SOLR
INTRAVENOUS | Status: DC | PRN
  Administered 2020-09-20: 2 g via INTRAVENOUS

## 2020-09-20 MED ORDER — CLOTRIMAZOLE 1 % EX CREA
1.0000 "application " | TOPICAL_CREAM | CUTANEOUS | Status: DC | PRN
  Filled 2020-09-20: qty 15

## 2020-09-20 MED ORDER — SODIUM CHLORIDE 0.9 % IV SOLN
INTRAVENOUS | Status: DC | PRN
  Administered 2020-09-20: 60 mL

## 2020-09-20 MED ORDER — HYDROCHLOROTHIAZIDE 25 MG PO TABS
25.0000 mg | ORAL_TABLET | Freq: Every day | ORAL | Status: DC
  Administered 2020-09-21 – 2020-09-22 (×2): 25 mg via ORAL
  Filled 2020-09-20 (×2): qty 1

## 2020-09-20 MED ORDER — BUPIVACAINE HCL (PF) 0.5 % IJ SOLN
INTRAMUSCULAR | Status: DC | PRN
  Administered 2020-09-20: 3 mL

## 2020-09-20 MED ORDER — ACETAMINOPHEN 10 MG/ML IV SOLN
INTRAVENOUS | Status: AC
  Filled 2020-09-20: qty 100

## 2020-09-20 MED ORDER — CELECOXIB 200 MG PO CAPS
ORAL_CAPSULE | ORAL | Status: AC
  Administered 2020-09-20: 400 mg via ORAL
  Filled 2020-09-20: qty 2

## 2020-09-20 MED ORDER — NEOMYCIN-POLYMYXIN B GU 40-200000 IR SOLN
Status: DC | PRN
  Administered 2020-09-20: 16 mL

## 2020-09-20 MED ORDER — PANTOPRAZOLE SODIUM 40 MG PO TBEC
40.0000 mg | DELAYED_RELEASE_TABLET | Freq: Two times a day (BID) | ORAL | Status: DC
  Administered 2020-09-20 – 2020-09-22 (×4): 40 mg via ORAL
  Filled 2020-09-20 (×4): qty 1

## 2020-09-20 MED ORDER — LACTATED RINGERS IV SOLN: INTRAVENOUS | Status: DC | PRN

## 2020-09-20 MED ORDER — ACETAMINOPHEN 325 MG PO TABS: 325.0000 mg | ORAL_TABLET | Freq: Four times a day (QID) | ORAL | Status: DC | PRN

## 2020-09-20 MED ORDER — CELECOXIB 200 MG PO CAPS
200.0000 mg | ORAL_CAPSULE | Freq: Two times a day (BID) | ORAL | Status: DC
  Administered 2020-09-20 – 2020-09-22 (×4): 200 mg via ORAL
  Filled 2020-09-20 (×4): qty 1

## 2020-09-20 MED ORDER — PROPOFOL 500 MG/50ML IV EMUL
INTRAVENOUS | Status: AC
  Filled 2020-09-20: qty 50

## 2020-09-20 MED ORDER — SODIUM CHLORIDE 0.9 % IV SOLN: INTRAVENOUS | Status: DC

## 2020-09-20 MED ORDER — DEXAMETHASONE SODIUM PHOSPHATE 10 MG/ML IJ SOLN
INTRAMUSCULAR | Status: AC
  Administered 2020-09-20: 8 mg via INTRAVENOUS
  Filled 2020-09-20: qty 1

## 2020-09-20 MED ORDER — GABAPENTIN 300 MG PO CAPS
300.0000 mg | ORAL_CAPSULE | Freq: Every day | ORAL | Status: DC
  Administered 2020-09-20 – 2020-09-21 (×2): 300 mg via ORAL
  Filled 2020-09-20 (×2): qty 1

## 2020-09-20 MED ORDER — SENNOSIDES-DOCUSATE SODIUM 8.6-50 MG PO TABS
1.0000 | ORAL_TABLET | Freq: Two times a day (BID) | ORAL | Status: DC
  Administered 2020-09-20 – 2020-09-22 (×4): 1 via ORAL
  Filled 2020-09-20 (×4): qty 1

## 2020-09-20 MED ORDER — MAGNESIUM HYDROXIDE 400 MG/5ML PO SUSP
30.0000 mL | Freq: Every day | ORAL | Status: DC
  Administered 2020-09-21 – 2020-09-22 (×2): 30 mL via ORAL
  Filled 2020-09-20 (×2): qty 30

## 2020-09-20 MED ORDER — PRAVASTATIN SODIUM 20 MG PO TABS
20.0000 mg | ORAL_TABLET | Freq: Every day | ORAL | Status: DC
  Administered 2020-09-20 – 2020-09-21 (×2): 20 mg via ORAL
  Filled 2020-09-20 (×2): qty 1

## 2020-09-20 MED ORDER — OMEGA-3-ACID ETHYL ESTERS 1 G PO CAPS
1.0000 g | ORAL_CAPSULE | Freq: Every day | ORAL | Status: DC
  Administered 2020-09-21 – 2020-09-22 (×2): 1 g via ORAL
  Filled 2020-09-20 (×2): qty 1

## 2020-09-20 MED ORDER — TRAMADOL HCL 50 MG PO TABS
50.0000 mg | ORAL_TABLET | ORAL | Status: DC | PRN
  Administered 2020-09-22 (×2): 50 mg via ORAL
  Filled 2020-09-20 (×3): qty 1

## 2020-09-20 MED ORDER — FENTANYL CITRATE (PF) 100 MCG/2ML IJ SOLN
INTRAMUSCULAR | Status: AC
  Administered 2020-09-20: 25 ug via INTRAVENOUS
  Filled 2020-09-20: qty 2

## 2020-09-20 MED ORDER — MENTHOL 3 MG MT LOZG
1.0000 | LOZENGE | OROMUCOSAL | Status: DC | PRN
  Filled 2020-09-20: qty 9

## 2020-09-20 MED ORDER — INSULIN ASPART 100 UNIT/ML ~~LOC~~ SOLN
0.0000 [IU] | Freq: Three times a day (TID) | SUBCUTANEOUS | Status: DC
  Administered 2020-09-20: 5 [IU] via SUBCUTANEOUS
  Administered 2020-09-21 (×3): 3 [IU] via SUBCUTANEOUS
  Administered 2020-09-22: 2 [IU] via SUBCUTANEOUS
  Filled 2020-09-20 (×5): qty 1

## 2020-09-20 MED ORDER — GABAPENTIN 300 MG PO CAPS
ORAL_CAPSULE | ORAL | Status: AC
  Administered 2020-09-20: 300 mg via ORAL
  Filled 2020-09-20: qty 1

## 2020-09-20 MED ORDER — CEFAZOLIN SODIUM-DEXTROSE 2-4 GM/100ML-% IV SOLN
2.0000 g | Freq: Four times a day (QID) | INTRAVENOUS | Status: AC
  Administered 2020-09-20: 2 g via INTRAVENOUS
  Filled 2020-09-20 (×3): qty 100

## 2020-09-20 MED ORDER — ACETAMINOPHEN 10 MG/ML IV SOLN
1000.0000 mg | Freq: Four times a day (QID) | INTRAVENOUS | Status: AC
  Administered 2020-09-20 – 2020-09-21 (×4): 1000 mg via INTRAVENOUS
  Filled 2020-09-20 (×4): qty 100

## 2020-09-20 MED ORDER — ENOXAPARIN SODIUM 30 MG/0.3ML ~~LOC~~ SOLN
30.0000 mg | Freq: Two times a day (BID) | SUBCUTANEOUS | Status: DC
  Administered 2020-09-21 – 2020-09-22 (×3): 30 mg via SUBCUTANEOUS
  Filled 2020-09-20 (×3): qty 0.3

## 2020-09-20 MED ORDER — FERROUS SULFATE 325 (65 FE) MG PO TABS
325.0000 mg | ORAL_TABLET | Freq: Two times a day (BID) | ORAL | Status: DC
  Administered 2020-09-20 – 2020-09-22 (×4): 325 mg via ORAL
  Filled 2020-09-20 (×4): qty 1

## 2020-09-20 MED ORDER — BUPIVACAINE HCL (PF) 0.25 % IJ SOLN
INTRAMUSCULAR | Status: DC | PRN
  Administered 2020-09-20: 60 mL

## 2020-09-20 MED ORDER — TRANEXAMIC ACID-NACL 1000-0.7 MG/100ML-% IV SOLN
INTRAVENOUS | Status: AC
  Administered 2020-09-20: 1000 mg via INTRAVENOUS
  Filled 2020-09-20: qty 100

## 2020-09-20 MED ORDER — ENSURE PRE-SURGERY PO LIQD
296.0000 mL | Freq: Once | ORAL | Status: DC
  Filled 2020-09-20: qty 296

## 2020-09-20 MED ORDER — FENTANYL CITRATE (PF) 100 MCG/2ML IJ SOLN
25.0000 ug | INTRAMUSCULAR | Status: AC | PRN
  Administered 2020-09-20 (×4): 25 ug via INTRAVENOUS

## 2020-09-20 MED ORDER — TRANEXAMIC ACID-NACL 1000-0.7 MG/100ML-% IV SOLN
INTRAVENOUS | Status: AC
  Filled 2020-09-20: qty 100

## 2020-09-20 MED ORDER — FLEET ENEMA 7-19 GM/118ML RE ENEM: 1.0000 | ENEMA | Freq: Once | RECTAL | Status: DC | PRN

## 2020-09-20 MED ORDER — CEFAZOLIN SODIUM-DEXTROSE 2-4 GM/100ML-% IV SOLN
INTRAVENOUS | Status: AC
  Administered 2020-09-20: 2000 mg
  Filled 2020-09-20: qty 100

## 2020-09-20 MED ORDER — OXYCODONE HCL 5 MG PO TABS
5.0000 mg | ORAL_TABLET | ORAL | Status: DC | PRN
  Administered 2020-09-20 (×2): 10 mg via ORAL
  Administered 2020-09-21 (×2): 5 mg via ORAL
  Filled 2020-09-20: qty 2
  Filled 2020-09-20: qty 1
  Filled 2020-09-20 (×2): qty 2

## 2020-09-20 MED ORDER — BISACODYL 10 MG RE SUPP
10.0000 mg | Freq: Every day | RECTAL | Status: DC | PRN
  Administered 2020-09-22: 10 mg via RECTAL
  Filled 2020-09-20: qty 1

## 2020-09-20 MED ORDER — DOXAZOSIN MESYLATE 4 MG PO TABS
4.0000 mg | ORAL_TABLET | Freq: Every day | ORAL | Status: DC
  Administered 2020-09-21 – 2020-09-22 (×2): 4 mg via ORAL
  Filled 2020-09-20 (×3): qty 1

## 2020-09-20 MED ORDER — SODIUM CHLORIDE 0.9 % IV SOLN
INTRAVENOUS | Status: DC | PRN
  Administered 2020-09-20: 50 ug/min via INTRAVENOUS

## 2020-09-20 MED ORDER — TRANEXAMIC ACID-NACL 1000-0.7 MG/100ML-% IV SOLN
INTRAVENOUS | Status: DC | PRN
  Administered 2020-09-20: 1000 mg via INTRAVENOUS

## 2020-09-20 MED ORDER — MIDAZOLAM HCL 2 MG/2ML IJ SOLN
INTRAMUSCULAR | Status: AC
  Filled 2020-09-20: qty 2

## 2020-09-20 MED ORDER — CHLORHEXIDINE GLUCONATE 0.12 % MT SOLN
OROMUCOSAL | Status: AC
  Administered 2020-09-20: 15 mL via OROMUCOSAL
  Filled 2020-09-20: qty 15

## 2020-09-20 MED ORDER — METOCLOPRAMIDE HCL 10 MG PO TABS
10.0000 mg | ORAL_TABLET | Freq: Three times a day (TID) | ORAL | Status: DC
  Administered 2020-09-20 – 2020-09-22 (×7): 10 mg via ORAL
  Filled 2020-09-20 (×7): qty 1

## 2020-09-20 MED ORDER — FAMOTIDINE 20 MG PO TABS
ORAL_TABLET | ORAL | Status: AC
  Administered 2020-09-20: 20 mg via ORAL
  Filled 2020-09-20: qty 1

## 2020-09-20 MED ORDER — ACETAMINOPHEN 10 MG/ML IV SOLN
INTRAVENOUS | Status: DC | PRN
  Administered 2020-09-20: 1000 mg via INTRAVENOUS

## 2020-09-20 MED ORDER — CITALOPRAM HYDROBROMIDE 20 MG PO TABS
20.0000 mg | ORAL_TABLET | Freq: Every day | ORAL | Status: DC
  Administered 2020-09-21 – 2020-09-22 (×2): 20 mg via ORAL
  Filled 2020-09-20 (×2): qty 1

## 2020-09-20 MED ORDER — AMLODIPINE BESYLATE 10 MG PO TABS
10.0000 mg | ORAL_TABLET | Freq: Every day | ORAL | Status: DC
  Administered 2020-09-21: 10 mg via ORAL
  Filled 2020-09-20: qty 1

## 2020-09-20 MED ORDER — ONDANSETRON HCL 4 MG PO TABS
4.0000 mg | ORAL_TABLET | Freq: Four times a day (QID) | ORAL | Status: DC | PRN
  Administered 2020-09-20: 4 mg via ORAL
  Filled 2020-09-20: qty 1

## 2020-09-20 MED ORDER — HYDROMORPHONE HCL 1 MG/ML IJ SOLN: 0.5000 mg | INTRAMUSCULAR | Status: DC | PRN

## 2020-09-20 MED ORDER — VITAMIN B-12 1000 MCG PO TABS
1000.0000 ug | ORAL_TABLET | Freq: Every day | ORAL | Status: DC
  Administered 2020-09-21 – 2020-09-22 (×2): 1000 ug via ORAL
  Filled 2020-09-20 (×2): qty 1

## 2020-09-20 MED ORDER — ONDANSETRON HCL 4 MG/2ML IJ SOLN
INTRAMUSCULAR | Status: DC | PRN
  Administered 2020-09-20: 4 mg via INTRAVENOUS

## 2020-09-20 MED ORDER — PROPOFOL 500 MG/50ML IV EMUL
INTRAVENOUS | Status: DC | PRN
  Administered 2020-09-20: 50 ug/kg/min via INTRAVENOUS

## 2020-09-20 MED ORDER — MIDAZOLAM HCL 5 MG/5ML IJ SOLN
INTRAMUSCULAR | Status: DC | PRN
  Administered 2020-09-20 (×2): 1 mg via INTRAVENOUS

## 2020-09-20 MED ORDER — DIPHENHYDRAMINE HCL 12.5 MG/5ML PO ELIX: 12.5000 mg | ORAL_SOLUTION | ORAL | Status: DC | PRN

## 2020-09-20 SURGICAL SUPPLY — 77 items
ATTUNE PS FEM LT SZ 4 CEM KNEE (Femur) ×2 IMPLANT
ATTUNE PSRP INSR SZ4 7 KNEE (Insert) ×1 IMPLANT
ATTUNE PSRP INSR SZ4 7MM KNEE (Insert) ×1 IMPLANT
BASEPLATE TIBIAL ROTATING SZ 4 (Knees) ×2 IMPLANT
BATTERY INSTRU NAVIGATION (MISCELLANEOUS) ×12 IMPLANT
BLADE SAW 70X12.5 (BLADE) ×3 IMPLANT
BLADE SAW 90X13X1.19 OSCILLAT (BLADE) ×3 IMPLANT
BLADE SAW 90X25X1.19 OSCILLAT (BLADE) ×3 IMPLANT
BONE CEMENT GENTAMICIN (Cement) ×6 IMPLANT
CANISTER PREVENA PLUS 150 (CANNISTER) ×3 IMPLANT
CANISTER SUCT 3000ML PPV (MISCELLANEOUS) ×3 IMPLANT
CEMENT BONE GENTAMICIN 40 (Cement) IMPLANT
COOLER POLAR GLACIER W/PUMP (MISCELLANEOUS) ×3 IMPLANT
COVER WAND RF STERILE (DRAPES) ×3 IMPLANT
CUFF TOURN SGL QUICK 30 (TOURNIQUET CUFF) ×2
CUFF TRNQT CYL 30X4X21-28X (TOURNIQUET CUFF) IMPLANT
DRAPE 3/4 80X56 (DRAPES) ×3 IMPLANT
DRSG DERMACEA 8X12 NADH (GAUZE/BANDAGES/DRESSINGS) ×3 IMPLANT
DRSG MEPILEX SACRM 8.7X9.8 (GAUZE/BANDAGES/DRESSINGS) ×3 IMPLANT
DRSG OPSITE POSTOP 4X12 (GAUZE/BANDAGES/DRESSINGS) ×2 IMPLANT
DRSG OPSITE POSTOP 4X14 (GAUZE/BANDAGES/DRESSINGS) ×3 IMPLANT
DRSG TEGADERM 4X4.75 (GAUZE/BANDAGES/DRESSINGS) ×3 IMPLANT
DURAPREP 26ML APPLICATOR (WOUND CARE) ×6 IMPLANT
ELECT REM PT RETURN 9FT ADLT (ELECTROSURGICAL) ×3
ELECTRODE REM PT RTRN 9FT ADLT (ELECTROSURGICAL) ×1 IMPLANT
EX-PIN ORTHOLOCK NAV 4X150 (PIN) ×6 IMPLANT
GLOVE BIO SURGEON STRL SZ7.5 (GLOVE) ×6 IMPLANT
GLOVE BIOGEL M STRL SZ7.5 (GLOVE) ×6 IMPLANT
GLOVE BIOGEL PI IND STRL 7.5 (GLOVE) ×1 IMPLANT
GLOVE BIOGEL PI INDICATOR 7.5 (GLOVE) ×2
GLOVE INDICATOR 8.0 STRL GRN (GLOVE) ×3 IMPLANT
GOWN STRL REUS W/ TWL LRG LVL3 (GOWN DISPOSABLE) ×2 IMPLANT
GOWN STRL REUS W/ TWL XL LVL3 (GOWN DISPOSABLE) ×1 IMPLANT
GOWN STRL REUS W/TWL LRG LVL3 (GOWN DISPOSABLE) ×4
GOWN STRL REUS W/TWL XL LVL3 (GOWN DISPOSABLE) ×2
HEMOVAC 400CC 10FR (MISCELLANEOUS) ×3 IMPLANT
HOLDER FOLEY CATH W/STRAP (MISCELLANEOUS) ×3 IMPLANT
HOOD PEEL AWAY FLYTE STAYCOOL (MISCELLANEOUS) ×6 IMPLANT
IRRIGATION SURGIPHOR STRL (IV SOLUTION) ×3 IMPLANT
KIT PREVENA INCISION MGT20CM45 (CANNISTER) ×3 IMPLANT
KIT PUMP PREVENA PLUS 14DAY (MISCELLANEOUS) ×3 IMPLANT
KIT TURNOVER KIT A (KITS) ×3 IMPLANT
KNIFE SCULPS 14X20 (INSTRUMENTS) ×3 IMPLANT
LABEL OR SOLS (LABEL) ×3 IMPLANT
MANIFOLD NEPTUNE II (INSTRUMENTS) ×6 IMPLANT
NDL SAFETY ECLIPSE 18X1.5 (NEEDLE) ×1 IMPLANT
NDL SPNL 20GX3.5 QUINCKE YW (NEEDLE) ×2 IMPLANT
NEEDLE HYPO 18GX1.5 SHARP (NEEDLE) ×2
NEEDLE SPNL 20GX3.5 QUINCKE YW (NEEDLE) ×6 IMPLANT
NS IRRIG 500ML POUR BTL (IV SOLUTION) ×3 IMPLANT
PACK TOTAL KNEE (MISCELLANEOUS) ×3 IMPLANT
PAD WRAPON POLAR KNEE (MISCELLANEOUS) ×1 IMPLANT
PATELLA MEDIAL ATTUN 35MM KNEE (Knees) ×2 IMPLANT
PENCIL SMOKE ULTRAEVAC 22 CON (MISCELLANEOUS) ×3 IMPLANT
PIN DRILL QUICK PACK ×3 IMPLANT
PIN FIXATION 1/8DIA X 3INL (PIN) ×9 IMPLANT
PULSAVAC PLUS IRRIG FAN TIP (DISPOSABLE) ×3
SOL .9 NS 3000ML IRR  AL (IV SOLUTION) ×2
SOL .9 NS 3000ML IRR UROMATIC (IV SOLUTION) ×1 IMPLANT
SOL PREP PVP 2OZ (MISCELLANEOUS) ×3
SOLUTION PREP PVP 2OZ (MISCELLANEOUS) ×1 IMPLANT
SPONGE DRAIN TRACH 4X4 STRL 2S (GAUZE/BANDAGES/DRESSINGS) ×3 IMPLANT
STAPLER SKIN PROX 35W (STAPLE) ×3 IMPLANT
STOCKINETTE IMPERV 14X48 (MISCELLANEOUS) ×2 IMPLANT
STRAP TIBIA SHORT (MISCELLANEOUS) ×3 IMPLANT
SUCTION FRAZIER HANDLE 10FR (MISCELLANEOUS) ×2
SUCTION TUBE FRAZIER 10FR DISP (MISCELLANEOUS) ×1 IMPLANT
SUT VIC AB 0 CT1 36 (SUTURE) ×6 IMPLANT
SUT VIC AB 1 CT1 36 (SUTURE) ×6 IMPLANT
SUT VIC AB 2-0 CT2 27 (SUTURE) ×3 IMPLANT
SYR 20ML LL LF (SYRINGE) ×3 IMPLANT
SYR 30ML LL (SYRINGE) ×6 IMPLANT
TIP FAN IRRIG PULSAVAC PLUS (DISPOSABLE) ×1 IMPLANT
TOWEL OR 17X26 4PK STRL BLUE (TOWEL DISPOSABLE) ×3 IMPLANT
TOWER CARTRIDGE SMART MIX (DISPOSABLE) ×3 IMPLANT
TRAY FOLEY MTR SLVR 16FR STAT (SET/KITS/TRAYS/PACK) ×3 IMPLANT
WRAPON POLAR PAD KNEE (MISCELLANEOUS) ×3

## 2020-09-20 NOTE — Plan of Care (Signed)
  Problem: Education: Goal: Knowledge of General Education information will improve Description: Including pain rating scale, medication(s)/side effects and non-pharmacologic comfort measures Outcome: Progressing   Problem: Health Behavior/Discharge Planning: Goal: Ability to manage health-related needs will improve Outcome: Progressing   Problem: Clinical Measurements: Goal: Ability to maintain clinical measurements within normal limits will improve Outcome: Progressing Goal: Will remain free from infection Outcome: Progressing Goal: Diagnostic test results will improve Outcome: Progressing Goal: Respiratory complications will improve Outcome: Progressing Goal: Cardiovascular complication will be avoided Outcome: Progressing   Problem: Nutrition: Goal: Adequate nutrition will be maintained Outcome: Progressing   Problem: Activity: Goal: Risk for activity intolerance will decrease Outcome: Progressing   Problem: Coping: Goal: Level of anxiety will decrease Outcome: Progressing   Problem: Elimination: Goal: Will not experience complications related to bowel motility Outcome: Progressing Goal: Will not experience complications related to urinary retention Outcome: Progressing   Problem: Pain Managment: Goal: General experience of comfort will improve Outcome: Progressing   Problem: Safety: Goal: Ability to remain free from injury will improve Outcome: Progressing   Problem: Skin Integrity: Goal: Risk for impaired skin integrity will decrease Outcome: Progressing   Problem: Clinical Measurements: Goal: Postoperative complications will be avoided or minimized Outcome: Progressing   Problem: Activity: Goal: Ability to avoid complications of mobility impairment will improve Outcome: Progressing Goal: Range of joint motion will improve Outcome: Progressing

## 2020-09-20 NOTE — Anesthesia Preprocedure Evaluation (Signed)
Anesthesia Evaluation  Patient identified by MRN, date of birth, ID band Patient awake    Reviewed: Allergy & Precautions, NPO status , Patient's Chart, lab work & pertinent test results  History of Anesthesia Complications (+) PONV and history of anesthetic complications  Airway Mallampati: II  TM Distance: >3 FB Neck ROM: Full    Dental no notable dental hx.    Pulmonary sleep apnea , neg COPD,    breath sounds clear to auscultation- rhonchi (-) wheezing      Cardiovascular hypertension, Pt. on medications (-) CAD, (-) Past MI and (-) Cardiac Stents  Rhythm:Regular Rate:Normal - Systolic murmurs and - Diastolic murmurs    Neuro/Psych PSYCHIATRIC DISORDERS Depression negative neurological ROS     GI/Hepatic negative GI ROS, Neg liver ROS,   Endo/Other  negative endocrine ROSneg diabetes  Renal/GU negative Renal ROS     Musculoskeletal negative musculoskeletal ROS (+)   Abdominal (+) + obese,   Peds  Hematology negative hematology ROS (+)   Anesthesia Other Findings Past Medical History: 2000: Breast cancer ([place])     Comment:  left breast lumpectomy with rad tx No date: Complication of anesthesia No date: Depression No date: Hyperlipidemia No date: Hypertension No date: Osteoporosis No date: PONV (postoperative nausea and vomiting) No date: Pre-diabetes No date: Sleep apnea     Comment:  no CPAP   Reproductive/Obstetrics                             Lab Results  Component Value Date   WBC 5.9 09/07/2020   HGB 12.2 09/20/2020   HCT 36.0 09/20/2020   MCV 82.7 09/07/2020   PLT 191 09/07/2020    Anesthesia Physical  Anesthesia Plan  ASA: II  Anesthesia Plan: Spinal   Post-op Pain Management:    Induction:   PONV Risk Score and Plan: Propofol infusion, Dexamethasone and Ondansetron  Airway Management Planned: Natural Airway and Simple Face Mask  Additional Equipment:    Intra-op Plan:   Post-operative Plan:   Informed Consent: I have reviewed the patients History and Physical, chart, labs and discussed the procedure including the risks, benefits and alternatives for the proposed anesthesia with the patient or authorized representative who has indicated his/her understanding and acceptance.     Dental advisory given  Plan Discussed with: CRNA and Anesthesiologist  Anesthesia Plan Comments:         Anesthesia Quick Evaluation

## 2020-09-20 NOTE — Evaluation (Signed)
Physical Therapy Evaluation Patient Details Name: Dominique Moore MRN: [id number] DOB: 1940/11/02 Today's Date: 09/20/2020   History of Present Illness  Pt is 79 yo female s/p L TKA, WBAT. PMH of depression, HTN, sleep apnea, breast cancer, HLD.  Clinical Impression  Patient alert, agreeable to PT, reported L knee pain as 8/10. Pt stated she is independent at baseline, [facility] for community ambulation, husband available to assist as needed.   The patient was able to perform several supine exercises, physical assist needed for heel slides due to increased pain. Supine to sit with HOB elevated and CGA (later returned to supine with minA for LLE assist). Exhibited fair sitting balance. Pt requested to utilize [facility], two person handheld assist with minAx2 due to lack of RW in room. Tech supplied RW and pt able to utilize to return to bedside with CGA, step to gait pattern, decreased weight bearing of LLE. Pt returned to bed at her request, agreeable to transfer to chair for dinner as able.  Overall the patient demonstrated deficits (see "PT Problem List") that impede the patient's functional abilities, safety, and mobility and would benefit from skilled PT intervention. Recommendation is HHPT with 24/7 assistance.     Follow Up Recommendations Home health PT;Supervision/Assistance - 24 hour    Equipment Recommendations  3in1 (PT)    Recommendations for Other Services       Precautions / Restrictions Precautions Precautions: Fall;Knee Precaution Booklet Issued: No Restrictions Weight Bearing Restrictions: Yes LLE Weight Bearing: Weight bearing as tolerated      Mobility  Bed Mobility Overal bed mobility: Needs Assistance Bed Mobility: Supine to Sit;Sit to Supine     Supine to sit: Min guard;HOB elevated Sit to supine: HOB elevated;Min assist   General bed mobility comments: cued for RLE to assist RLE to return to supine, light minA for LE    Transfers Overall transfer level: Needs  assistance Equipment used: Rolling walker (2 wheeled) Transfers: Sit to/from Stand Sit to Stand: Min guard            Ambulation/Gait Ambulation/Gait assistance: Min guard Gait Distance (Feet): 2 Feet Assistive device: Rolling walker (2 wheeled)       General Gait Details: step to gait pattern. antalgic, decreased LLE weight bearing  Stairs            Wheelchair Mobility    Modified Rankin (Stroke Patients Only)       Balance Overall balance assessment: Needs assistance Sitting-balance support: Feet supported Sitting balance-Leahy Scale: Fair       Standing balance-Leahy Scale: Poor Standing balance comment: reliant on UE support                             Pertinent Vitals/Pain Pain Assessment: 0-10 Pain Score: 8  Pain Location: L knee Pain Descriptors / Indicators: Aching;Burning;Grimacing Pain Intervention(s): Limited activity within patient's tolerance;Monitored during session;Repositioned;Premedicated before session;Ice applied    Home Living Family/patient expects to be discharged to:: Private residence Living Arrangements: Spouse/significant other Available Help at Discharge: Family;Available 24 hours/day Type of Home: House Home Access: Stairs to enter Entrance Stairs-Rails: Psychiatric nurse of Steps: 5-6 Home Layout: Able to live on main level with bedroom/bathroom;One level Home Equipment: Cane - single point;Walker - 2 wheels;Grab bars - tub/shower;Grab bars - toilet      Prior Function Level of Independence: Independent with assistive device(s)         Comments: SPC for community ambulation  Hand Dominance   Dominant Hand: Right    Extremity/Trunk Assessment   Upper Extremity Assessment Upper Extremity Assessment: Overall WFL for tasks assessed    Lower Extremity Assessment Lower Extremity Assessment: RLE deficits/detail;LLE deficits/detail RLE Deficits / Details: WFLs LLE Deficits /  Details: s/p TKA    Cervical / Trunk Assessment Cervical / Trunk Assessment: Normal  Communication   Communication: No difficulties  Cognition Arousal/Alertness: Awake/alert Behavior During Therapy: WFL for tasks assessed/performed Overall Cognitive Status: Within Functional Limits for tasks assessed                                        General Comments      Exercises Total Joint Exercises Ankle Circles/Pumps: Both;AROM;10 reps Quad Sets: AROM;Strengthening;Both;10 reps Gluteal Sets: AROM;Strengthening;Both;10 reps Heel Slides: AAROM;Strengthening;Left;10 reps Straight Leg Raises: AROM;Strengthening;Left;10 reps Goniometric ROM: -8 to 60 degrees Other Exercises Other Exercises: assisted to [facility] via two person handheld assist due to lack of RW, tech entered room once on commode with RW, utilized for remainder of mobility   Assessment/Plan    PT Assessment Patient needs continued PT services  PT Problem List Decreased strength;Decreased mobility;Decreased range of motion;Decreased activity tolerance;Decreased balance;Decreased knowledge of use of DME;Pain       PT Treatment Interventions DME instruction;Therapeutic exercise;Gait training;Balance training;Stair training;Neuromuscular re-education;Functional mobility training;Therapeutic activities;Patient/family education    PT Goals (Current goals can be found in the Care Plan section)  Acute Rehab PT Goals Patient Stated Goal: to go home PT Goal Formulation: With patient Time For Goal Achievement: 10/04/20 Potential to Achieve Goals: Good    Frequency BID   Barriers to discharge        Co-evaluation               AM-PAC PT "6 Clicks" Mobility  Outcome Measure Help needed turning from your back to your side while in a flat bed without using bedrails?: A Little Help needed moving from lying on your back to sitting on the side of a flat bed without using bedrails?: A Little Help needed moving  to and from a bed to a chair (including a wheelchair)?: A Little Help needed standing up from a chair using your arms (e.g., wheelchair or bedside chair)?: A Little Help needed to walk in hospital room?: A Little Help needed climbing 3-5 steps with a railing? : A Lot 6 Click Score: 17    End of Session Equipment Utilized During Treatment: Gait belt Activity Tolerance: Patient tolerated treatment well Patient left: in bed;with call bell/phone within reach;with bed alarm set;Other (comment) (polar care in place, heels elevated) Nurse Communication: Mobility status PT Visit Diagnosis: Other abnormalities of gait and mobility (R26.89);Muscle weakness (generalized) (M62.81);Pain;Difficulty in walking, not elsewhere classified (R26.2) Pain - Right/Left: Left Pain - part of body: Knee    Time: 2831-5176 PT Time Calculation (min) (ACUTE ONLY): 30 min   Charges:   PT Evaluation $PT Eval Low Complexity: 1 Low PT Treatments $Therapeutic Exercise: 8-22 mins        Lieutenant Diego PT, DPT 4:40 PM,09/20/20

## 2020-09-20 NOTE — Anesthesia Procedure Notes (Signed)
Spinal  Patient location during procedure: OR Start time: 09/20/2020 8:16 AM End time: 09/20/2020 8:24 AM Staffing Performed: resident/CRNA  Resident/CRNA: Nelda Marseille, CRNA Preanesthetic Checklist Completed: patient identified, IV checked, site marked, risks and benefits discussed, surgical consent, monitors and equipment checked, pre-op evaluation and timeout performed Spinal Block Patient position: sitting Prep: Betadine Patient monitoring: heart rate, continuous pulse ox, blood pressure and cardiac monitor Approach: midline Location: L3-4 Injection technique: single-shot Needle Needle type: Whitacre and Introducer  Needle gauge: 25 G Needle length: 9 cm Assessment Sensory level: T10 Additional Notes Negative paresthesia. Negative blood return. Positive free-flowing CSF. Expiration date of kit checked and confirmed. Patient tolerated procedure well, without complications.

## 2020-09-20 NOTE — H&P (Signed)
The patient has been re-examined, and the chart reviewed, and there have been no interval changes to the documented history and physical.    The risks, benefits, and alternatives have been discussed at length. The patient expressed understanding of the risks benefits and agreed with plans for surgical intervention.  Marilyne Haseley P. Nnamdi Dacus, Jr. M.D.    

## 2020-09-20 NOTE — H&P (Signed)
ORTHOPAEDIC HISTORY & PHYSICAL  Progress Notes Gwenlyn Fudge, PA - 09/12/2020 9:00 AM EST [facility] Chief Complaint:   Chief Complaint  Patient presents with  . Knee Pain  H & P LEFT KNEE   History of Present Illness:   Dominique Moore is a 79 y.o. female that presents to clinic today for her preoperative history and evaluation. Patient presents with her husband. The patient is scheduled to undergo a left total knee arthroplasty on 09/20/20 by Dr. Marry Guan. Patient reports long history of left knee pain.The pain is located primarily along the medial aspect of the knee. She describes her pain as worse with weightbearing. She reports associated swelling and some giving way of the knee. She denies associated numbness or tingling, denies locking of the knee.   The patient's symptoms have progressed to the point that they decrease her quality of life. The patient has previously undergone conservative treatment including NSAIDS and injections to the knee without adequate control of her symptoms.  Denies significant cardiac history, history of blood clots or lumbar surgery. Patient does report a penicillin allergy. She reports a rash at that time.   Past Medical, Surgical, Family, Social History, Allergies, Medications:   Past Medical History:  Past Medical History:  Diagnosis Date  . Carcinoma in situ of breast  . Depressive disorder, not elsewhere classified  . Essential hypertension, benign  . History of bronchitis  History of recurrent bronchitis, stable.  . Impaired fasting glucose  . Osteoporosis, unspecified  . Other and unspecified hyperlipidemia  . Unspecified sleep apnea   Past Surgical History:  Past Surgical History:  Procedure Laterality Date  . CATARACT EXTRACTION Bilateral 2014  . COLONOSCOPY 01/20/2006  Hyperplastic Polyps: CBF 01/2016; Recall Ltr mailed 12/11/2015 (dw); OV made 01/26/2016 @ 10:30am w/Kim Jerelene Redden NP (dw)  .  COLONOSCOPY 02/22/2016  Adenomatous Polyps: CBF 02/2021  . MASTECTOMY Left 2002  Partial left mastectomy in 2002.  . Right total knee arthroplasty using computer-assisted navigation 08/27/2017  Dr Marry Guan   Current Medications:  Current Outpatient Medications  Medication Sig Dispense Refill  . omega 3-dha-epa-fish oil 360 mg-144 mg- 216 mg-1,200 mg CpDR Take 2,400 mg by mouth once daily  . acetaminophen (TYLENOL) 500 MG tablet Take 500 mg by mouth every 6 (six) hours as needed  . amLODIPine (NORVASC) 10 MG tablet TAKE 1 TABLET BY MOUTH DAILY 90 tablet 3  . atenoloL (TENORMIN) 25 MG tablet TAKE 1 TABLET BY MOUTH DAILY 90 tablet 3  . azithromycin (ZITHROMAX) 500 MG tablet 500 mg Take 1 tablet one (1) hour prior to dentist appointment  . citalopram (CELEXA) 20 MG tablet TAKE 1 TABLET BY MOUTH ONCE DAILY 90 tablet 3  . clotrimazole (LOTRIMIN) 1 % cream Apply 1 application topically as needed (for hands).  . coenzyme Q10 (CO Q-10) 10 mg capsule Take 10 mg by mouth once daily.  . cyanocobalamin (VITAMIN B12) 1000 MCG tablet Take 1,000 mcg by mouth once daily.  Marland Kitchen doxazosin (CARDURA) 4 MG tablet TAKE 1 TABLET BY MOUTH DAILY 90 tablet 3  . hydroCHLOROthiazide (HYDRODIURIL) 25 MG tablet TAKE 1 TABLET BY MOUTH DAILY 90 tablet 3  . losartan (COZAAR) 100 MG tablet TAKE 1 TABLET BY MOUTH ONCE DAILY 90 tablet 3  . pravastatin (PRAVACHOL) 20 MG tablet TAKE 1 TABLET BY MOUTH EVERY DAY AT NIGHT 14 tablet 0   No current facility-administered medications for this visit.   Allergies:  Allergies  Allergen Reactions  .  Ace Inhibitors Swelling  . Avelox [Moxifloxacin] Swelling  . Penicillins Hives  . Codeine Phosphate Nausea  . Zocor [Simvastatin] Muscle Pain   Social History:  Social History   Socioeconomic History  . Marital status: Married  Spouse name: Gilberto Streck  . Number of children: 2  . Years of education: 48  . Highest education level: Not on file  Occupational History  . Occupation:  Retired  Tobacco Use  . Smoking status: Never Smoker  . Smokeless tobacco: Never Used  Vaping Use  . Vaping Use: Never used  Substance and Sexual Activity  . Alcohol use: No  Alcohol/week: 0.0 standard drinks  . Drug use: No  . Sexual activity: Defer  Partners: Male  Other Topics Concern  . Not on file  Social History Narrative  . Not on file   Social Determinants of Health   Financial Resource Strain: Not on file  Food Insecurity: Not on file  Transportation Needs: Not on file  Physical Activity: Not on file  Stress: Not on file  Social Connections: Not on file  Housing Stability: Not on file   Family History:  Family History  Problem Relation Age of Onset  . Breast cancer Mother  . Heart disease Mother  . Diabetes type II Mother  . Myocardial Infarction (Heart attack) Mother  . High blood pressure (Hypertension) Mother  . Fibrocystic breast disease Sister  . Heart disease Brother  Rheumatic heart disease.  . Fibrocystic breast disease Sister  . Fibrocystic breast disease Sister  . Fibrocystic breast disease Sister  . Cancer Father   Review of Systems:   A 10+ ROS was performed, reviewed, and the pertinent orthopaedic findings are documented in the HPI.   Physical Examination:   BP 126/82  Ht 154.9 cm (5\' 1" )  Wt (!) 101.9 kg (224 lb 9.6 oz)  LMP (LMP Unknown)  BMI 42.44 kg/m   Patient is a well-developed, well-nourished female in no acute distress. Patient has normal mood and affect. Patient is alert and oriented to person, place, and time.   HEENT: Atraumatic, normocephalic. Pupils equal and reactive to light. Extraocular motion intact. Noninjected sclera.  Cardiovascular: Regular rate and rhythm, with no murmurs, rubs, or gallops. Distal pulses palpable. No bruits.  Respiratory: Lungs clear to auscultation bilaterally.   Left Knee: Soft tissue swelling: mild Effusion: none Erythema: none Crepitance: mild Tenderness: medial Alignment: relative  varus Mediolateral laxity: medial pseudolaxity Posterior sag: negative Patellar tracking: Good tracking without evidence of subluxation or tilt Atrophy: No significant atrophy.  Quadriceps tone was fair to good. Range of motion: 0/7/109 degrees   Sensation intact over the saphenous, lateral sural cutaneous, superficial fibular, and deep fibular nerve distributions.  Tests Performed/Reviewed:  X-rays  Anteroposterior, lateral, and sunrise view of the left knee were obtained. Images reveal complete loss of medial compartment joint space with near bone-on-bone contact and osteophyte formation noted. Lateral compartment reveals mild osteophyte formation but is otherwise relatively well-preserved. Loss of patellofemoral joint space is noted primarily laterally. Minimal spurring of the patella noted. No fractures or dislocations.  Impression:   ICD-10-CM  1. Primary osteoarthritis of left knee M17.12   Plan:   The patient has end-stage degenerative changes of the left knee. It was explained to the patient that the condition is progressive in nature. Having failed conservative treatment, the patient has elected to proceed with a total joint arthroplasty. The patient will undergo a total joint arthroplasty with Dr. Marry Guan. The risks of surgery, including blood  clot and infection, were discussed with the patient. Measures to reduce these risks, including the use of anticoagulation, perioperative antibiotics, and early ambulation were discussed. The importance of postoperative physical therapy was discussed with the patient. The patient elects to proceed with surgery. The patient is instructed to stop all blood thinners prior to surgery. The patient is instructed to call the hospital the day before surgery to learn of the proper arrival time.   Contact our office with any questions or concerns. Follow up as indicated, or sooner should any new problems arise, if conditions worsen, or if they are  otherwise concerned.   Gwenlyn Fudge, PA-C [facility] [place], [place] [address] Phone: [phone]  This note was generated in part with voice recognition software and I apologize for any typographical errors that were not detected and corrected.

## 2020-09-20 NOTE — Transfer of Care (Signed)
Immediate Anesthesia Transfer of Care Note  Patient: Dominique Moore  Procedure(s) Performed: COMPUTER ASSISTED TOTAL KNEE ARTHROPLASTY (Left Knee)  Patient Location: PACU  Anesthesia Type:Spinal  Level of Consciousness: awake, alert  and oriented  Airway & Oxygen Therapy: Patient Spontanous Breathing and Patient connected to face mask oxygen  Post-op Assessment: Report given to RN and Post -op Vital signs reviewed and stable  Post vital signs: Reviewed and stable  Last Vitals:  Vitals Value Taken Time  BP 117/73 09/20/20 1210  Temp    Pulse 70 09/20/20 1215  Resp 15 09/20/20 1215  SpO2 99 % 09/20/20 1215  Vitals shown include unvalidated device data.  Last Pain: There were no vitals filed for this visit.       Complications: No complications documented.

## 2020-09-20 NOTE — Op Note (Signed)
OPERATIVE NOTE  DATE OF SURGERY:  09/20/2020  PATIENT NAME:  Dominique Moore   DOB: 06/21/1941  MRN: [id number]  PRE-OPERATIVE DIAGNOSIS: Degenerative arthrosis of the left knee, primary  POST-OPERATIVE DIAGNOSIS:  Same  PROCEDURE:  Left total knee arthroplasty using computer-assisted navigation  SURGEON:  Marciano Sequin. M.D.  ANESTHESIA: spinal  ESTIMATED BLOOD LOSS: 50 mL  FLUIDS REPLACED: 1200 mL of crystalloid  TOURNIQUET TIME: 98 minutes  DRAINS: 2 medium Hemovac  SOFT TISSUE RELEASES: Anterior cruciate ligament, posterior cruciate ligament, deep medial collateral ligament, patellofemoral ligament  IMPLANTS UTILIZED: DePuy Attune size 4 posterior stabilized femoral component (cemented), size 4 rotating platform tibial component (cemented), 35 mm medialized dome patella (cemented), and a 7 mm stabilized rotating platform polyethylene insert.  INDICATIONS FOR SURGERY: Dominique Moore is a 79 y.o. year old female with a long history of progressive knee pain. X-rays demonstrated severe degenerative changes in tricompartmental fashion. The patient had not seen any significant improvement despite conservative nonsurgical intervention. After discussion of the risks and benefits of surgical intervention, the patient expressed understanding of the risks benefits and agree with plans for total knee arthroplasty.   The risks, benefits, and alternatives were discussed at length including but not limited to the risks of infection, bleeding, nerve injury, stiffness, blood clots, the need for revision surgery, cardiopulmonary complications, among others, and they were willing to proceed.  PROCEDURE IN DETAIL: The patient was brought into the operating room and, after adequate spinal anesthesia was achieved, a tourniquet was placed on the patient's upper thigh. The patient's knee and leg were cleaned and prepped with alcohol and DuraPrep and draped in the usual sterile fashion. A "timeout" was  performed as per usual protocol. The lower extremity was exsanguinated using an Esmarch, and the tourniquet was inflated to 300 mmHg. An anterior longitudinal incision was made followed by a standard mid vastus approach. The deep fibers of the medial collateral ligament were elevated in a subperiosteal fashion off of the medial flare of the tibia so as to maintain a continuous soft tissue sleeve. The patella was subluxed laterally and the patellofemoral ligament was incised. Inspection of the knee demonstrated severe degenerative changes with full-thickness loss of articular cartilage. Osteophytes were debrided using a rongeur. Anterior and posterior cruciate ligaments were excised. Two 4.0 mm Schanz pins were inserted in the femur and into the tibia for attachment of the array of trackers used for computer-assisted navigation. Hip center was identified using a circumduction technique. Distal landmarks were mapped using the computer. The distal femur and proximal tibia were mapped using the computer. The distal femoral cutting guide was positioned using computer-assisted navigation so as to achieve a 5 distal valgus cut. The femur was sized and it was felt that a size 4 femoral component was appropriate. A size 4 femoral cutting guide was positioned and the anterior cut was performed and verified using the computer. This was followed by completion of the posterior and chamfer cuts. Femoral cutting guide for the central box was then positioned in the center box cut was performed.  Attention was then directed to the proximal tibia. Medial and lateral menisci were excised. The extramedullary tibial cutting guide was positioned using computer-assisted navigation so as to achieve a 0 varus-valgus alignment and 3 posterior slope. The cut was performed and verified using the computer. The proximal tibia was sized and it was felt that a size 4 tibial tray was appropriate. Tibial and femoral trials were inserted  followed by  insertion of a 7 mm polyethylene insert. This allowed for excellent mediolateral soft tissue balancing both in flexion and in full extension. Finally, the patella was cut and prepared so as to accommodate a 35 mm medialized dome patella. A patella trial was placed and the knee was placed through a range of motion with excellent patellar tracking appreciated. The femoral trial was removed after debridement of posterior osteophytes. The central post-hole for the tibial component was reamed followed by insertion of a keel punch. Tibial trials were then removed. Cut surfaces of bone were irrigated with copious amounts of normal saline using pulsatile lavage and then suctioned dry. Polymethylmethacrylate cement with gentamicin was prepared in the usual fashion using a vacuum mixer. Cement was applied to the cut surface of the proximal tibia as well as along the undersurface of a size 4 rotating platform tibial component. Tibial component was positioned and impacted into place. Excess cement was removed using [employer] fast streamer. Cement was then applied to the cut surfaces of the femur as well as along the posterior flanges of the size 4 femoral component. The femoral component was positioned and impacted into place. Excess cement was removed using [employer] fast streamer. A 7 mm polyethylene trial was inserted and the knee was brought into full extension with steady axial compression applied. Finally, cement was applied to the backside of a 35 mm medialized dome patella and the patellar component was positioned and patellar clamp applied. Excess cement was removed using [employer] fast streamer. After adequate curing of the cement, the tourniquet was deflated after a total tourniquet time of 98 minutes. Hemostasis was achieved using electrocautery. The knee was irrigated with copious amounts of normal saline using pulsatile lavage followed by 500 ml of Surgiphor and then suctioned dry. 20 mL of 1.3% Exparel and 60 mL of 0.25%  Marcaine in 40 mL of normal saline was injected along the posterior capsule, medial and lateral gutters, and along the arthrotomy site. A 7 mm stabilized rotating platform polyethylene insert was inserted and the knee was placed through a range of motion with excellent mediolateral soft tissue balancing appreciated and excellent patellar tracking noted. 2 medium drains were placed in the wound bed and brought out through separate stab incisions. The medial parapatellar portion of the incision was reapproximated using interrupted sutures of #1 Vicryl. Subcutaneous tissue was approximated in layers using first #0 Vicryl followed #2-0 Vicryl. The skin was approximated with skin staples. A sterile dressing was applied.  The patient tolerated the procedure well and was transported to the recovery room in stable condition.    Remberto Lienhard P. Holley Bouche., M.D.

## 2020-09-21 LAB — GLUCOSE, CAPILLARY
Glucose-Capillary: 170 mg/dL — ABNORMAL HIGH (ref 70–99)
Glucose-Capillary: 153 mg/dL — ABNORMAL HIGH (ref 70–99)
Glucose-Capillary: 160 mg/dL — ABNORMAL HIGH (ref 70–99)
Glucose-Capillary: 120 mg/dL — ABNORMAL HIGH (ref 70–99)

## 2020-09-21 MED ORDER — ENOXAPARIN SODIUM 40 MG/0.4ML ~~LOC~~ SOLN: 40.0000 mg | SUBCUTANEOUS | 0 refills | Status: DC

## 2020-09-21 MED ORDER — OXYCODONE HCL 5 MG PO TABS
5.0000 mg | ORAL_TABLET | ORAL | 0 refills | Status: DC | PRN
Start: 2020-09-21 — End: 2022-10-18

## 2020-09-21 MED ORDER — CELECOXIB 200 MG PO CAPS: 200.0000 mg | ORAL_CAPSULE | Freq: Two times a day (BID) | ORAL | 1 refills | Status: DC

## 2020-09-21 MED ORDER — TRAMADOL HCL 50 MG PO TABS
50.0000 mg | ORAL_TABLET | ORAL | 0 refills | Status: DC | PRN
Start: 2020-09-21 — End: 2022-10-18

## 2020-09-21 NOTE — Progress Notes (Signed)
Physical Therapy Treatment Patient Details Name: Dominique Moore MRN: [id number] DOB: 1941-06-16 Today's Date: 09/21/2020    History of Present Illness Pt is 79 yo female s/p L TKA, WBAT. PMH of depression, HTN, sleep apnea, breast cancer, HLD.    PT Comments    Patient alert, in chair, agreeable to PT. Endorsed 2/10 pain in L knee. Pt focused on functional mobility including stair navigation. The patient performed bed mobility with supervision, sit <> stand with modI from EOB and from standard commode (supervision). She ambulated ~155ft total with RW and supervision. Improved gait velocity as well as balance noted this session. Overall the patient has exhibited great progress towards goals, current plan remains appropriate.     Follow Up Recommendations  Home health PT;Supervision/Assistance - 24 hour     Equipment Recommendations  3in1 (PT)    Recommendations for Other Services       Precautions / Restrictions Precautions Precautions: Fall;Knee Precaution Booklet Issued: Yes (comment) Restrictions Weight Bearing Restrictions: Yes LLE Weight Bearing: Weight bearing as tolerated    Mobility  Bed Mobility   Bed Mobility: Sit to Supine       Sit to supine: Supervision;HOB elevated      Transfers Overall transfer level: Modified independent Equipment used: Rolling walker (2 wheeled) Transfers: Sit to/from Stand           General transfer comment: in recliner at start of session returned to bed at end of session at pt request  Ambulation/Gait Ambulation/Gait assistance: Supervision Gait Distance (Feet): 180 Feet Assistive device: Rolling walker (2 wheeled)       General Gait Details: pt with step through gait pattern, excellent weight bearing through LEs noted, mildly antalgic gait. improved velocity noted this PM   Stairs Stairs: Yes Stairs assistance: Supervision;Min guard Stair Management: One rail Right;One rail Left;Two rails;Step to pattern Number  of Stairs: 4 General stair comments: performed twice, once with bilateral rails, once with unilateral rail   Wheelchair Mobility    Modified Rankin (Stroke Patients Only)       Balance Overall balance assessment: Needs assistance Sitting-balance support: Feet supported Sitting balance-Leahy Scale: Fair       Standing balance-Leahy Scale: Fair Standing balance comment: able to statically stand and wash her hands at the sink                            Cognition Arousal/Alertness: Awake/alert Behavior During Therapy: WFL for tasks assessed/performed Overall Cognitive Status: Within Functional Limits for tasks assessed                                        Exercises Total Joint Exercises Goniometric ROM: -4 to 70 degrees Other Exercises Other Exercises: Pt utilized the standard commode with grab bars with supervision, able to stand and wash her hands at the sink without support    General Comments        Pertinent Vitals/Pain Pain Assessment: 0-10 Pain Score: 3  Pain Location: L knee Pain Descriptors / Indicators: Aching;Burning;Grimacing Pain Intervention(s): Limited activity within patient's tolerance;Premedicated before session;Repositioned;Monitored during session    Home Living                      Prior Function            PT Goals (current goals can now be  found in the care plan section) Progress towards PT goals: Progressing toward goals    Frequency    BID      PT Plan Current plan remains appropriate    Co-evaluation              AM-PAC PT "6 Clicks" Mobility   Outcome Measure  Help needed turning from your back to your side while in a flat bed without using bedrails?: None Help needed moving from lying on your back to sitting on the side of a flat bed without using bedrails?: None Help needed moving to and from a bed to a chair (including a wheelchair)?: None Help needed standing up from a chair  using your arms (e.g., wheelchair or bedside chair)?: None Help needed to walk in hospital room?: None Help needed climbing 3-5 steps with a railing? : None 6 Click Score: 24    End of Session Equipment Utilized During Treatment: Gait belt Activity Tolerance: Patient tolerated treatment well Patient left: in bed;with call bell/phone within reach;with bed alarm set;Other (comment) Nurse Communication: Mobility status PT Visit Diagnosis: Other abnormalities of gait and mobility (R26.89);Muscle weakness (generalized) (M62.81);Pain;Difficulty in walking, not elsewhere classified (R26.2) Pain - Right/Left: Left Pain - part of body: Knee     Time: 4920-1007 PT Time Calculation (min) (ACUTE ONLY): 29 min  Charges:  $Gait Training: 8-22 mins $Therapeutic Exercise: 8-22 mins                     Lieutenant Diego PT, DPT 4:04 PM,09/21/20

## 2020-09-21 NOTE — Plan of Care (Signed)
  Problem: Education: Goal: Knowledge of General Education information will improve Description Including pain rating scale, medication(s)/side effects and non-pharmacologic comfort measures Outcome: Progressing   

## 2020-09-21 NOTE — Plan of Care (Signed)

## 2020-09-21 NOTE — Evaluation (Signed)
Occupational Therapy Evaluation Patient Details Name: Dominique Moore MRN: [id number] DOB: [birth year] Today's Date: 09/21/2020    History of Present Illness Pt is 79 yo female s/p L TKA, WBAT. PMH of depression, HTN, sleep apnea, breast cancer, HLD.   Clinical Impression   Patient presenting with decreased I in self care, balance, functional mobility/transfers, endurance, and safety awareness. Patient reports living at home with husband and use of RW as needed PTA. Pt reports taking sink baths at baseline, has elevated toilet, and long handled reacher for self care needs. OT providing pt with paper handout and reviewing polar care, which was familiar to her, with pt verbalizing understanding. Patient currently functioning at supervision - min A ( LB self care). Patient will benefit from acute OT to increase overall independence in the areas of ADLs, functional mobility, and safety awareness in order to safely discharge home with husband.    Follow Up Recommendations  No OT follow up;Supervision - Intermittent    Equipment Recommendations  None recommended by OT       Precautions / Restrictions Precautions Precautions: Fall;Knee Precaution Booklet Issued: Yes (comment) Restrictions Weight Bearing Restrictions: Yes LLE Weight Bearing: Weight bearing as tolerated      Mobility Bed Mobility Overal bed mobility: Needs Assistance;Modified Independent Bed Mobility: Supine to Sit     Supine to sit: Modified independent (Device/Increase time);HOB elevated     General bed mobility comments: Pt seated in recliner chair upon entering the room    Transfers Overall transfer level: Needs assistance Equipment used: Rolling walker (2 wheeled) Transfers: Sit to/from [employer] Transfers Sit to Stand: Supervision Stand pivot transfers: Supervision       General transfer comment: Pt able to transfer to [facility] without RW and UE support on commode arm rests; sit <> stand from [facility] and  recliner with supervision and RW    Balance Overall balance assessment: Needs assistance Sitting-balance support: Feet supported Sitting balance-Leahy Scale: Fair       Standing balance-Leahy Scale: Fair Standing balance comment: able to statically stand with at least unilateral support           ADL either performed or assessed with clinical judgement   ADL Overall ADL's : Needs assistance/impaired     Grooming: Wash/dry hands;Wash/dry face;Oral care;Sitting;Set up               Lower Body Dressing: Minimal assistance;Sit to/from stand        General ADL Comments: Pt able to reach sock to pull off of foot but would likely need assistance to get back on and thread clothing but has reacher at home and husband to assist as needed.     Vision Patient Visual Report: No change from baseline              Pertinent Vitals/Pain Pain Assessment: 0-10 Pain Score: 3  Pain Location: L knee Pain Descriptors / Indicators: Aching;Burning;Grimacing Pain Intervention(s): Limited activity within patient's tolerance;Repositioned;Monitored during session     Hand Dominance Right   Extremity/Trunk Assessment Upper Extremity Assessment Upper Extremity Assessment: Overall WFL for tasks assessed   Lower Extremity Assessment Lower Extremity Assessment: Defer to PT evaluation   Cervical / Trunk Assessment Cervical / Trunk Assessment: Normal   Communication Communication Communication: No difficulties   Cognition Arousal/Alertness: Awake/alert Behavior During Therapy: WFL for tasks assessed/performed Overall Cognitive Status: Within Functional Limits for tasks assessed                Exercises Total Joint  Exercises Quad Sets: AROM;Strengthening;Both;15 reps Long Arc Quad: AROM;Strengthening;Left;15 reps   Shoulder Instructions      Home Living Family/patient expects to be discharged to:: Private residence Living Arrangements: Spouse/significant other Available  Help at Discharge: Family;Available 24 hours/day Type of Home: House Home Access: Stairs to enter [employer] of Steps: 5-6 Entrance Stairs-Rails: Right;Left Home Layout: Able to live on main level with bedroom/bathroom;One level     Bathroom Shower/Tub: Teacher, early years/pre: Handicapped height     Home Equipment: [place] - single point;Walker - 2 wheels;Grab bars - tub/shower;Grab bars - toilet          Prior Functioning/Environment Level of Independence: Independent with assistive device(s)        Comments: SPC for community ambulation        OT Problem List: Decreased strength;Decreased range of motion;Decreased activity tolerance;Decreased safety awareness;Impaired balance (sitting and/or standing);Decreased knowledge of use of DME or AE;Decreased knowledge of precautions      OT Treatment/Interventions: Self-care/ADL training;Therapeutic exercise;Therapeutic activities;Energy conservation;DME and/or AE instruction;Patient/family education;Balance training;Manual therapy    OT Goals(Current goals can be found in the care plan section) Acute Rehab OT Goals Patient Stated Goal: to go home OT Goal Formulation: With patient Time For Goal Achievement: 10/05/20 Potential to Achieve Goals: Good ADL Goals Pt Will Perform Grooming: with modified independence;standing Pt Will Transfer to Toilet: with modified independence;ambulating Pt Will Perform Toileting - Clothing Manipulation and hygiene: with modified independence;sit to/from stand  OT Frequency: Min 1X/week   Barriers to D/C:    none known at this time          AM-PAC OT "6 Clicks" Daily Activity     Outcome Measure Help from another person eating meals?: None Help from another person taking care of personal grooming?: None Help from another person toileting, which includes using toliet, bedpan, or urinal?: A Little Help from another person bathing (including washing, rinsing, drying)?: A  Little Help from another person to put on and taking off regular upper body clothing?: None Help from another person to put on and taking off regular lower body clothing?: A Little 6 Click Score: 21   End of Session Equipment Utilized During Treatment: Rolling walker;Other (comment) (polar care) Nurse Communication: Mobility status  Activity Tolerance: Patient tolerated treatment well Patient left: in chair;with call bell/phone within reach;with chair alarm set  OT Visit Diagnosis: Muscle weakness (generalized) (M62.81);Unsteadiness on feet (R26.81)                Time: 7902-4097 OT Time Calculation (min): 19 min Charges:  OT General Charges $OT Visit: 1 Visit OT Evaluation $OT Eval Low Complexity: 1 Low OT Treatments $Self Care/Home Management : 8-22 mins  Darleen Crocker, MS, OTR/L , CBIS ascom [phone]  09/21/20, 11:02 AM

## 2020-09-21 NOTE — Anesthesia Postprocedure Evaluation (Signed)
Anesthesia Post Note  Patient: Dominique Moore  Procedure(s) Performed: COMPUTER ASSISTED TOTAL KNEE ARTHROPLASTY (Left Knee)  Patient location during evaluation: PACU Anesthesia Type: Spinal Level of consciousness: oriented and awake and alert Pain management: pain level controlled Vital Signs Assessment: post-procedure vital signs reviewed and stable Respiratory status: spontaneous breathing, respiratory function stable and patient connected to nasal cannula oxygen Cardiovascular status: blood pressure returned to baseline and stable Postop Assessment: no headache, no backache and no apparent nausea or vomiting Anesthetic complications: no   No complications documented.   Last Vitals:  Vitals:   09/21/20 0427 09/21/20 0733  BP: 115/73 (!) 142/87  Pulse: 73 79  Resp: 16 15  Temp: 36.5 C 36.6 C  SpO2: 94% 95%    Last Pain:  Vitals:   09/21/20 0733  TempSrc: Oral  PainSc:                  Jerrye Noble

## 2020-09-21 NOTE — Progress Notes (Signed)
Physical Therapy Treatment Patient Details Name: Dominique Moore MRN: [id number] DOB: 1941-09-24 Today's Date: 09/21/2020    History of Present Illness Pt is 79 yo female s/p L TKA, WBAT. PMH of depression, HTN, sleep apnea, breast cancer, HLD.    PT Comments    Pt alert, agreeable to PT, reported knee pain at rest 3/10, with ambulation 6/10. The patient demonstrated great progress towards goals this session; able to perform bed mobility modI, and transfers with supervision. She also increased her ambulation distance, ambulated ~33ft with RW and supervision. The patient did exhibit decreased activity tolerance, as well as antalgic gait on LLE, though reciprocal gait pattern noted. Pt cued for heel strike to improve knee extension during stance phase of gait with fair carryover. Pt up in chair at end of session with all needs in reach. Tentative plan for stair training this PM as pain allows.     Follow Up Recommendations  Home health PT;Supervision/Assistance - 24 hour     Equipment Recommendations  3in1 (PT)    Recommendations for Other Services       Precautions / Restrictions Precautions Precautions: Fall;Knee Precaution Booklet Issued: Yes (comment) Restrictions Weight Bearing Restrictions: Yes LLE Weight Bearing: Weight bearing as tolerated    Mobility  Bed Mobility Overal bed mobility: Needs Assistance;Modified Independent Bed Mobility: Supine to Sit     Supine to sit: Modified independent (Device/Increase time);HOB elevated     General bed mobility comments: no physical assist needed  Transfers Overall transfer level: Needs assistance Equipment used: Rolling walker (2 wheeled) Transfers: Sit to/from [employer] Sit to Stand: Supervision Stand pivot transfers: Supervision       General transfer comment: Pt able to transfer to [facility] without RW and UE support on commode arm rests; sit <> stand from [facility] and recliner with supervision and  RW  Ambulation/Gait Ambulation/Gait assistance: Supervision Gait Distance (Feet): 70 Feet Assistive device: Rolling walker (2 wheeled)       General Gait Details: pt with step through gait pattern, excellent weight bearing through LEs noted, mildly antalgic gait. Cued for heel stike on LLE, mild knee flexion noted throughout gait phases   Stairs             Wheelchair Mobility    Modified Rankin (Stroke Patients Only)       Balance Overall balance assessment: Needs assistance Sitting-balance support: Feet supported Sitting balance-Leahy Scale: Fair       Standing balance-Leahy Scale: Fair Standing balance comment: able to statically stand with at least unilateral support                            Cognition Arousal/Alertness: Awake/alert Behavior During Therapy: WFL for tasks assessed/performed Overall Cognitive Status: Within Functional Limits for tasks assessed                                        Exercises Total Joint Exercises Quad Sets: AROM;Strengthening;Both;15 reps Long Arc Quad: AROM;Strengthening;Left;15 reps    General Comments        Pertinent Vitals/Pain Pain Assessment: 0-10 Pain Score: 3  Pain Location: L knee Pain Descriptors / Indicators: Aching;Burning;Grimacing Pain Intervention(s): Limited activity within patient's tolerance;Monitored during session;Repositioned;Premedicated before session;Ice applied    Home Living  Prior Function            PT Goals (current goals can now be found in the care plan section) Progress towards PT goals: Progressing toward goals    Frequency    BID      PT Plan Current plan remains appropriate    Co-evaluation              AM-PAC PT "6 Clicks" Mobility   Outcome Measure  Help needed turning from your back to your side while in a flat bed without using bedrails?: None Help needed moving from lying on your back to  sitting on the side of a flat bed without using bedrails?: None Help needed moving to and from a bed to a chair (including a wheelchair)?: None Help needed standing up from a chair using your arms (e.g., wheelchair or bedside chair)?: None Help needed to walk in hospital room?: A Little Help needed climbing 3-5 steps with a railing? : A Little 6 Click Score: 22    End of Session Equipment Utilized During Treatment: Gait belt Activity Tolerance: Patient tolerated treatment well Patient left: in bed;with call bell/phone within reach;with bed alarm set;Other (comment) Nurse Communication: Mobility status PT Visit Diagnosis: Other abnormalities of gait and mobility (R26.89);Muscle weakness (generalized) (M62.81);Pain;Difficulty in walking, not elsewhere classified (R26.2) Pain - Right/Left: Left Pain - part of body: Knee     Time: 3338-3291 PT Time Calculation (min) (ACUTE ONLY): 17 min  Charges:  $Therapeutic Exercise: 8-22 mins                    Lieutenant Diego PT, DPT 9:48 AM,09/21/20

## 2020-09-21 NOTE — Progress Notes (Signed)
  Subjective: 1 Day Post-Op Procedure(s) (LRB): COMPUTER ASSISTED TOTAL KNEE ARTHROPLASTY (Left) Patient reports pain as mild.   Patient is well, and has had no acute complaints or problems Plan is to go Home after hospital stay. Negative for chest pain and shortness of breath Fever: no Gastrointestinal: Negative for nausea and vomiting  Objective: Vital signs in last 24 hours: Temp:  [96.8 F (36 C)-98.3 F (36.8 C)] 97.7 F (36.5 C) (12/02 0427) Pulse Rate:  [34-93] 73 (12/02 0427) Resp:  [8-24] 16 (12/02 0427) BP: (110-138)/(65-111) 115/73 (12/02 0427) SpO2:  [91 %-100 %] 94 % (12/02 0427) Weight:  [100.7 kg] 100.7 kg (12/01 1418)  Intake/Output from previous day:  Intake/Output Summary (Last 24 hours) at 09/21/2020 0643 Last data filed at 09/21/2020 0429 Gross per 24 hour  Intake 2350 ml  Output 991 ml  Net 1359 ml    Intake/Output this shift: Total I/O In: -  Out: 120 [Drains:120]  Labs: Recent Labs    09/20/20 0803  HGB 12.2   Recent Labs    09/20/20 0803  HCT 36.0   Recent Labs    09/20/20 0803  NA 139  K 3.5  CL 100  BUN 12  CREATININE 0.70  GLUCOSE 174*   No results for input(s): LABPT, INR in the last 72 hours.   EXAM General - Patient is Alert and Oriented Extremity - Neurovascular intact Sensation intact distally Intact pulses distally Dorsiflexion/Plantar flexion intact Compartment soft Dressing/Incision - clean, dry, with the Hemovac intact Motor Function - intact, moving foot and toes well on exam.  Able to straight leg raise independently.  Past Medical History:  Diagnosis Date  . Breast cancer ([place]) 2000   left breast lumpectomy with rad tx  . Complication of anesthesia   . Depression   . Hyperlipidemia   . Hypertension   . Osteoporosis   . Personal history of radiation therapy   . PONV (postoperative nausea and vomiting)   . Pre-diabetes   . Sleep apnea    no CPAP    Assessment/Plan: 1 Day Post-Op Procedure(s)  (LRB): COMPUTER ASSISTED TOTAL KNEE ARTHROPLASTY (Left) Active Problems:   Total knee replacement status  Estimated body mass index is 40.6 kg/m as calculated from the following:   Height as of this encounter: 5\' 2"  (1.575 m).   Weight as of this encounter: 100.7 kg. Advance diet Up with therapy D/C IV fluids Plan for discharge tomorrow  DVT Prophylaxis - Lovenox, Foot Pumps and TED hose Weight-Bearing as tolerated to left leg  Reche Dixon, PA-C Orthopaedic Surgery 09/21/2020, 6:43 AM

## 2020-09-22 LAB — GLUCOSE, CAPILLARY: Glucose-Capillary: 142 mg/dL — ABNORMAL HIGH (ref 70–99)

## 2020-09-22 NOTE — TOC Initial Note (Signed)
Transition of Care [facility]) - Initial/Assessment Note    Patient Details  Name: Dominique Moore MRN: [id number] Date of Birth: 07/24/1941  [facility]) CM/SW Contact:    Magnus Ivan, LCSW Phone Number: 09/22/2020, 9:42 AM  Clinical Narrative:       Patient has orders to discharge home today post procedure. CSW spoke to patient. Patient lives with her husband who drives her to appointments. PCP is Dr. Edwina Barth. Pharmacies are [facility] ([facility] order[facility] or [facility] [place]. Patient has a RW and grabber at home. No SNF history. Patient is on [facility] list for HHPT arranged by Surgeon's office. Patient confirmed she plans to use [facility] for HHPT. Patient declined a 3 in 1. No other needs identified prior to discharge.             Expected Discharge Plan: [place] Barriers to Discharge: Barriers Resolved   Patient Goals and CMS Choice Patient states their goals for this hospitalization and ongoing recovery are:: home with home health CMS Medicare.gov Compare Post Acute Care list provided to:: Patient Choice offered to / list presented to : Patient  Expected Discharge Plan and Services Expected Discharge Plan: [place]       Living arrangements for the past 2 months: Single Family Home Expected Discharge Date: 09/22/20                         HH Arranged: PT [place] Agency: [facility] [facility] [employer]     Representative spoke with at [place]: [place] Arrangements/Services Living arrangements for the past 2 months: [place] with:: Spouse Patient language and need for interpreter reviewed:: Yes Do you feel safe going back to the place where you live?: Yes      Need for Family Participation in Patient Care: Yes (Comment) Care giver support system in place?: Yes (comment) Current home services: DME Criminal Activity/Legal Involvement Pertinent to Current Situation/Hospitalization: No -  Comment as needed  Activities of Daily Living Home Assistive Devices/Equipment: Dentures (specify type), Cane (specify quad or straight) ADL Screening (condition at time of admission) Patient's cognitive ability adequate to safely complete daily activities?: Yes Is the patient deaf or have difficulty hearing?: No Does the patient have difficulty seeing, even when wearing glasses/contacts?: No Does the patient have difficulty concentrating, remembering, or making decisions?: No (sometimes) Patient able to express need for assistance with ADLs?: Yes Does the patient have difficulty dressing or bathing?: No Independently performs ADLs?: Yes (appropriate for developmental age) Does the patient have difficulty walking or climbing stairs?: Yes Weakness of Legs: Left Weakness of Arms/Hands: None  Permission Sought/Granted Permission sought to share information with : Chartered certified accountant granted to share information with : Yes, Verbal Permission Granted     Permission granted to share info w AGENCY: [facility]        Emotional Assessment       Orientation: : Oriented to Self, Oriented to Place, Oriented to  Time, Oriented to Situation Alcohol / Substance Use: Not Applicable Psych Involvement: No (comment)  Admission diagnosis:  Total knee replacement status [Z96.659] Patient Active Problem List   Diagnosis Date Noted  . Total knee replacement status 09/20/2020  . BMI 40.0-44.9, adult ([place]) 05/19/2019  . Type 2 diabetes, diet controlled ([place]) 09/21/2018  . Status post total right knee replacement 08/27/2017   PCP:  Harrel Lemon  D, MD Pharmacy:   [place] [place] [address] [place] [address] Phone: [phone] Fax: [phone]  [id number] [place], [place] - [address] [place] [address] Phone: [phone] Fax: [phone]     Social Determinants of  Health (SDOH) Interventions    Readmission Risk Interventions No flowsheet data found.

## 2020-09-22 NOTE — Discharge Summary (Signed)
Physician Discharge Summary  Patient ID: CALANDRIA MULLINGS MRN: [id number] DOB/AGE: 27-Dec-1940 79 y.o.  Admit date: 09/20/2020 Discharge date: 09/22/2020  Admission Diagnoses:  Total knee replacement status [Z96.659] Primary osteoarthritis of the left knee.  Discharge Diagnoses: Patient Active Problem List   Diagnosis Date Noted  . Total knee replacement status 09/20/2020  . BMI 40.0-44.9, adult ([place]) 05/19/2019  . Type 2 diabetes, diet controlled ([place]) 09/21/2018  . Status post total right knee replacement 08/27/2017    Past Medical History:  Diagnosis Date  . Breast cancer ([place]) 2000   left breast lumpectomy with rad tx  . Complication of anesthesia   . Depression   . Hyperlipidemia   . Hypertension   . Osteoporosis   . Personal history of radiation therapy   . PONV (postoperative nausea and vomiting)   . Pre-diabetes   . Sleep apnea    no CPAP     Transfusion: None.   Consultants (if any):   Discharged Condition: Improved  Hospital Course: ALESHIA CARTELLI is an 79 y.o. female who was admitted 09/20/2020 with a diagnosis of degenerative arthrosis of the left knee and went to the operating room on 09/20/2020 and underwent the above named procedures.    Surgeries: Procedure(s): COMPUTER ASSISTED TOTAL KNEE ARTHROPLASTY on 09/20/2020 Patient tolerated the surgery well. Taken to PACU where she was stabilized and then transferred to the orthopedic floor.  Started on Lovenox 30mg  q 12 hrs. Foot pumps applied bilaterally at 80 mm. Heels elevated on bed with rolled towels. No evidence of DVT. Negative [place]. Physical therapy started on day #1 for gait training and transfer. OT started day #1 for ADL and assisted devices.  Patient's IV and hemovac were removed on POD2.  Implants: DePuy Attune size 4 posterior stabilized femoral component (cemented), size 4 rotating platform tibial component (cemented), 35 mm medialized dome patella (cemented), and a 7 mm stabilized rotating  platform polyethylene insert.  She was given perioperative antibiotics:  Anti-infectives (From admission, onward)   Start     Dose/Rate Route Frequency Ordered Stop   09/20/20 1500  ceFAZolin (ANCEF) IVPB 2g/100 mL premix        2 g 200 mL/hr over 30 Minutes Intravenous Every 6 hours 09/20/20 1425 09/21/20 0259   09/20/20 0736  ceFAZolin (ANCEF) 2-4 GM/100ML-% IVPB       Note to Pharmacy: [place]   : cabinet override      09/20/20 0736 09/20/20 1640   09/20/20 0600  ceFAZolin (ANCEF) IVPB 2g/100 mL premix  Status:  Discontinued        2 g 200 mL/hr over 30 Minutes Intravenous On call to O.R. 09/19/20 2247 09/20/20 0710    .  She was given sequential compression devices, early ambulation, and Lovenox for DVT prophylaxis.  She benefited maximally from the hospital stay and there were no complications.    Recent vital signs:  Vitals:   09/22/20 0000 09/22/20 0327  BP: (!) 152/81 134/74  Pulse: 72 78  Resp: 16 16  Temp: 98.5 F (36.9 C) 98.1 F (36.7 C)  SpO2: 95% 95%    Recent laboratory studies:  Lab Results  Component Value Date   HGB 12.2 09/20/2020   HGB 11.7 (L) 09/07/2020   HGB 11.1 (L) 08/29/2017   Lab Results  Component Value Date   WBC 5.9 09/07/2020   PLT 191 09/07/2020   Lab Results  Component Value Date   INR 1.0 09/07/2020   Lab Results  Component  Value Date   NA 139 09/20/2020   K 3.5 09/20/2020   CL 100 09/20/2020   CO2 29 09/07/2020   BUN 12 09/20/2020   CREATININE 0.70 09/20/2020   GLUCOSE 174 (H) 09/20/2020    Discharge Medications:   Allergies as of 09/22/2020      Reactions   Ace Inhibitors Swelling   Feet swelling   Avelox [moxifloxacin] Swelling   Codeine Nausea And Vomiting   Penicillins Hives   IgE = 123 (WNL) on 09/07/2020      Medication List    TAKE these medications   acetaminophen 500 MG tablet Commonly known as: TYLENOL Take 500 mg by mouth 2 (two) times daily as needed for moderate pain or headache.    amLODipine 10 MG tablet Commonly known as: NORVASC Take 10 mg by mouth daily.   atenolol 25 MG tablet Commonly known as: TENORMIN Take 25 mg by mouth daily.   azithromycin 500 MG tablet Commonly known as: ZITHROMAX Take 500 mg by mouth See admin instructions. Take 500 mg 1 hour prior to dental work   celecoxib 200 MG capsule Commonly known as: CELEBREX Take 1 capsule (200 mg total) by mouth 2 (two) times daily.   citalopram 20 MG tablet Commonly known as: CELEXA Take 20 mg by mouth daily.   clotrimazole 1 % cream Commonly known as: LOTRIMIN Apply 1 application topically as needed (rash on hands).   CoQ-10 100 MG Caps Take 100 mg by mouth daily.   doxazosin 4 MG tablet Commonly known as: CARDURA Take 4 mg by mouth daily.   enoxaparin 40 MG/0.4ML injection Commonly known as: LOVENOX Inject 0.4 mLs (40 mg total) into the skin daily for 14 doses.   Fish Oil 1000 MG Caps Take 1,000 mg by mouth daily.   hydrochlorothiazide 25 MG tablet Commonly known as: HYDRODIURIL Take 25 mg by mouth daily.   losartan 100 MG tablet Commonly known as: COZAAR Take 100 mg by mouth daily.   oxyCODONE 5 MG immediate release tablet Commonly known as: Oxy IR/ROXICODONE Take 1-2 tablets (5-10 mg total) by mouth every 4 (four) hours as needed for moderate pain (pain score 4-6).   pravastatin 20 MG tablet Commonly known as: PRAVACHOL Take 20 mg by mouth at bedtime.   traMADol 50 MG tablet Commonly known as: ULTRAM Take 1-2 tablets (50-100 mg total) by mouth every 4 (four) hours as needed for moderate pain.   vitamin B-12 1000 MCG tablet Commonly known as: CYANOCOBALAMIN Take 1,000 mcg by mouth daily.            Durable Medical Equipment  (From admission, onward)         Start     Ordered   09/20/20 1426  DME Walker rolling  Once       Question:  Patient needs a walker to treat with the following condition  Answer:  Total knee replacement status   09/20/20 1425    09/20/20 1426  DME Bedside commode  Once       Question:  Patient needs a bedside commode to treat with the following condition  Answer:  Total knee replacement status   09/20/20 1425         Diagnostic Studies: DG Knee Left Port  Result Date: 09/20/2020 CLINICAL DATA:  Left total knee arthroplasty. EXAM: PORTABLE LEFT KNEE - 1-2 VIEW COMPARISON:  None. FINDINGS: The left femoral and tibial components appear to be well situated. Expected postoperative changes, including surgical drain are noted  in the soft tissues anteriorly. IMPRESSION: Status post left total knee arthroplasty. Electronically Signed   By: Marijo Conception M.D.   On: 09/20/2020 12:43   Disposition: Plan for discharge home today pending a bowel movement this AM.   Follow-up Information    Fausto Skillern, PA-C On 10/05/2020.   Specialty: Orthopedic Surgery Why: at 9:15am Contact information: [place] [address] [phone]        Dereck Leep, MD On 11/02/2020.   Specialty: Orthopedic Surgery Why: at 9:45am Contact information: [place] [address] [phone]              Signed: Judson Roch PA-C 09/22/2020, 7:26 AM

## 2020-09-22 NOTE — Progress Notes (Signed)
  Subjective: 2 Days Post-Op Procedure(s) (LRB): COMPUTER ASSISTED TOTAL KNEE ARTHROPLASTY (Left) Patient reports pain as mild.   Patient is well, and has had no acute complaints or problems Plan is to go Home after hospital stay. Negative for chest pain and shortness of breath Fever: no Gastrointestinal: Negative for nausea and vomiting Patient has cleared stairs and walked more than 180 feet yesterday with PT. Has not had a BM yet.  Objective: Vital signs in last 24 hours: Temp:  [97.9 F (36.6 C)-98.7 F (37.1 C)] 98.1 F (36.7 C) (12/03 0327) Pulse Rate:  [55-79] 78 (12/03 0327) Resp:  [15-16] 16 (12/03 0327) BP: (126-163)/(62-87) 134/74 (12/03 0327) SpO2:  [94 %-96 %] 95 % (12/03 0327)  Intake/Output from previous day:  Intake/Output Summary (Last 24 hours) at 09/22/2020 0724 Last data filed at 09/22/2020 0254 Gross per 24 hour  Intake 2271.88 ml  Output 670 ml  Net 1601.88 ml    Intake/Output this shift: No intake/output data recorded.  Labs: Recent Labs    09/20/20 0803  HGB 12.2   Recent Labs    09/20/20 0803  HCT 36.0   Recent Labs    09/20/20 0803  NA 139  K 3.5  CL 100  BUN 12  CREATININE 0.70  GLUCOSE 174*   No results for input(s): LABPT, INR in the last 72 hours.   EXAM General - Patient is Alert and Oriented Extremity - Neurovascular intact Sensation intact distally Intact pulses distally Dorsiflexion/Plantar flexion intact Incision: dressing C/D/I Compartment soft Dressing/Incision - Bulky dressing removed, honeycomb dressing with very mild bloody drainage.  Hemovac was removed today and 4x4 with tegaderm applied. Motor Function - intact, moving foot and toes well on exam.  Able to straight leg raise independently. Mild abdominal distention, intact bowel sounds.  Past Medical History:  Diagnosis Date  . Breast cancer ([place]) 2000   left breast lumpectomy with rad tx  . Complication of anesthesia   . Depression   . Hyperlipidemia    . Hypertension   . Osteoporosis   . Personal history of radiation therapy   . PONV (postoperative nausea and vomiting)   . Pre-diabetes   . Sleep apnea    no CPAP    Assessment/Plan: 2 Days Post-Op Procedure(s) (LRB): COMPUTER ASSISTED TOTAL KNEE ARTHROPLASTY (Left) Active Problems:   Total knee replacement status  Estimated body mass index is 40.6 kg/m as calculated from the following:   Height as of this encounter: 5\' 2"  (1.575 m).   Weight as of this encounter: 100.7 kg. Discharge home with home health   Labs reviewed this AM. Hemovac removed today.  Bulky dressing removed. Patient has cleared stairs with PT. Plan for discharge home today after the patient has had a BM.  DVT Prophylaxis - Lovenox, Foot Pumps and TED hose Weight-Bearing as tolerated to left leg  J. Cameron Proud, PA-C Orthopaedic Surgery 09/22/2020, 7:24 AM

## 2020-09-22 NOTE — Progress Notes (Signed)
PT Cancellation Note  Patient Details Name: Dominique Moore MRN: [id number] DOB: 06-01-1941   Cancelled Treatment:    Reason Eval/Treat Not Completed: Other (comment) (Pt politely declined PT due to recent suppository. PT to re-attempt as able.)   Lieutenant Diego PT, DPT 8:41 AM,09/22/20

## 2021-05-16 ENCOUNTER — Other Ambulatory Visit: Payer: Self-pay | Admitting: Internal Medicine

## 2021-05-16 DIAGNOSIS — Z1231 Encounter for screening mammogram for malignant neoplasm of breast: Secondary | ICD-10-CM

## 2021-05-28 ENCOUNTER — Other Ambulatory Visit: Payer: Self-pay

## 2021-05-28 ENCOUNTER — Ambulatory Visit
Admission: RE | Admit: 2021-05-28 | Discharge: 2021-05-28 | Disposition: A | Discharge status: Home / Self Care | Payer: Medicare Other | Source: Ambulatory Visit | Attending: Internal Medicine | Admitting: Internal Medicine

## 2021-05-28 DIAGNOSIS — Z1231 Encounter for screening mammogram for malignant neoplasm of breast: Secondary | ICD-10-CM | POA: Insufficient documentation

## 2022-02-13 DIAGNOSIS — I480 Paroxysmal atrial fibrillation: Secondary | ICD-10-CM | POA: Diagnosis present

## 2022-03-14 DIAGNOSIS — I272 Pulmonary hypertension, unspecified: Secondary | ICD-10-CM | POA: Insufficient documentation

## 2022-03-26 MED ORDER — SODIUM CHLORIDE 0.9 % IV SOLN
INTRAVENOUS | Status: DC
Start: 2022-03-26 — End: 2022-03-27

## 2022-03-27 ENCOUNTER — Encounter: Payer: Self-pay | Admitting: Internal Medicine

## 2022-03-27 ENCOUNTER — Encounter
Admission: RE | Disposition: A | Discharge status: Home / Self Care | Payer: Self-pay | Source: Ambulatory Visit | Attending: Internal Medicine

## 2022-03-27 ENCOUNTER — Encounter: Payer: Self-pay | Admitting: Anesthesiology

## 2022-03-27 ENCOUNTER — Ambulatory Visit
Admission: RE | Admit: 2022-03-27 | Discharge: 2022-03-27 | Disposition: A | Discharge status: Home / Self Care | Payer: Medicare Other | Source: Ambulatory Visit | Attending: Internal Medicine | Admitting: Internal Medicine

## 2022-03-27 DIAGNOSIS — Z538 Procedure and treatment not carried out for other reasons: Secondary | ICD-10-CM | POA: Insufficient documentation

## 2022-03-27 DIAGNOSIS — I480 Paroxysmal atrial fibrillation: Secondary | ICD-10-CM

## 2022-03-27 DIAGNOSIS — I4891 Unspecified atrial fibrillation: Secondary | ICD-10-CM | POA: Diagnosis present

## 2022-03-27 SURGERY — CARDIOVERSION
Anesthesia: General

## 2022-03-27 NOTE — Anesthesia Preprocedure Evaluation (Deleted)
Anesthesia Evaluation    Airway        Dental   Pulmonary           Cardiovascular hypertension, Pt. on medications + dysrhythmias Atrial Fibrillation   TTE 02/2022: INTERPRETATION  NORMAL LEFT VENTRICULAR SYSTOLIC FUNCTION  WITH MILD LVH  NORMAL RIGHT VENTRICULAR SYSTOLIC FUNCTION  NO VALVULAR STENOSIS  MILD to MODERATE TR  MILD MR, PR  EF 55%     Neuro/Psych    GI/Hepatic   Endo/Other    Renal/GU      Musculoskeletal   Abdominal   Peds  Hematology   Anesthesia Other Findings   Reproductive/Obstetrics                             Anesthesia Physical Anesthesia Plan Anesthesia Quick Evaluation

## 2022-03-27 NOTE — Final Progress Note (Signed)
Pt. In SB/NSR on arrival to special procedures. 12 lead EKG done & seen by Dr. Nehemiah Massed after anesthesia saw pt. Cardioversion cancelled for this AM. Pt. To follow up with MD in "about a week." Pt. States she will call office when she returns home (office not open yet). Pt. Stable for DC home now.

## 2022-05-01 ENCOUNTER — Other Ambulatory Visit: Payer: Self-pay | Admitting: Internal Medicine

## 2022-05-01 DIAGNOSIS — Z1231 Encounter for screening mammogram for malignant neoplasm of breast: Secondary | ICD-10-CM

## 2022-06-03 ENCOUNTER — Ambulatory Visit
Admission: RE | Admit: 2022-06-03 | Discharge: 2022-06-03 | Disposition: A | Discharge status: Home / Self Care | Payer: Medicare Other | Source: Ambulatory Visit | Attending: Internal Medicine | Admitting: Internal Medicine

## 2022-06-03 DIAGNOSIS — Z1231 Encounter for screening mammogram for malignant neoplasm of breast: Secondary | ICD-10-CM | POA: Insufficient documentation

## 2022-06-08 IMAGING — MG MM DIGITAL SCREENING BILAT W/ TOMO AND CAD
8 series · 8 of 24 positions shown · non-contrast
Comparison: Previous exam(s).

CLINICAL DATA: Screening.

EXAM:
DIGITAL SCREENING BILATERAL MAMMOGRAM WITH TOMOSYNTHESIS AND CAD
TECHNIQUE: Bilateral screening digital craniocaudal and mediolateral oblique
mammograms were obtained. Bilateral screening digital breast
tomosynthesis was performed. The images were evaluated with
computer-aided detection.

[R MLO synth-2D]
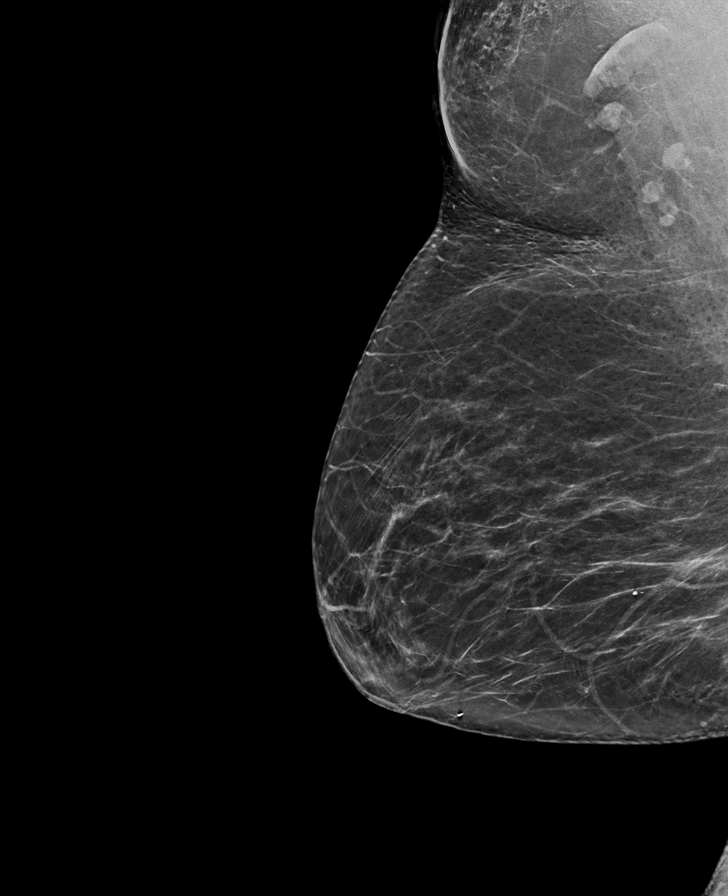

[L CC synth-2D]
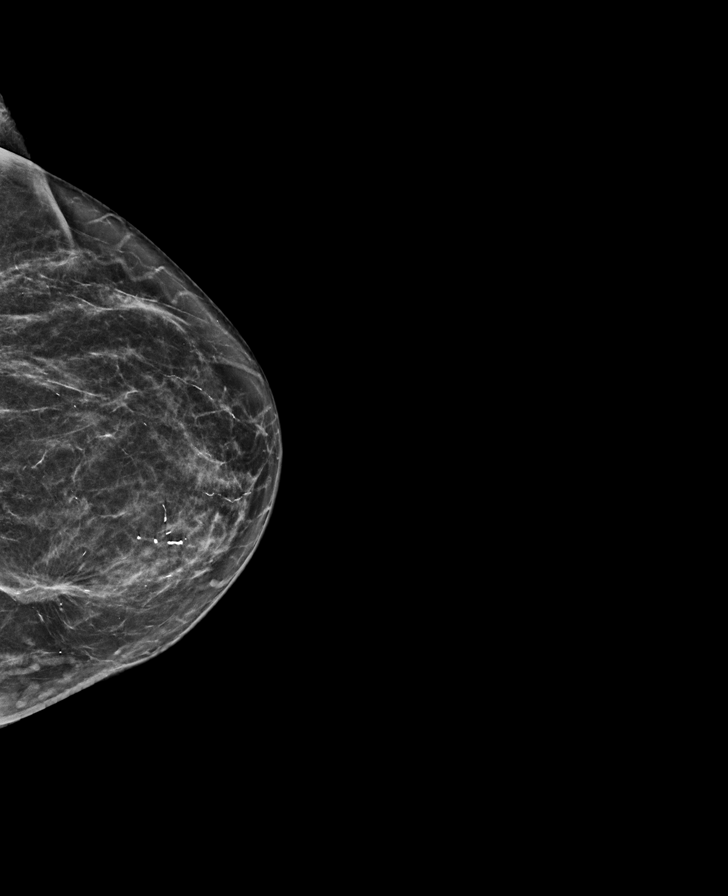

[L MLO synth-2D]
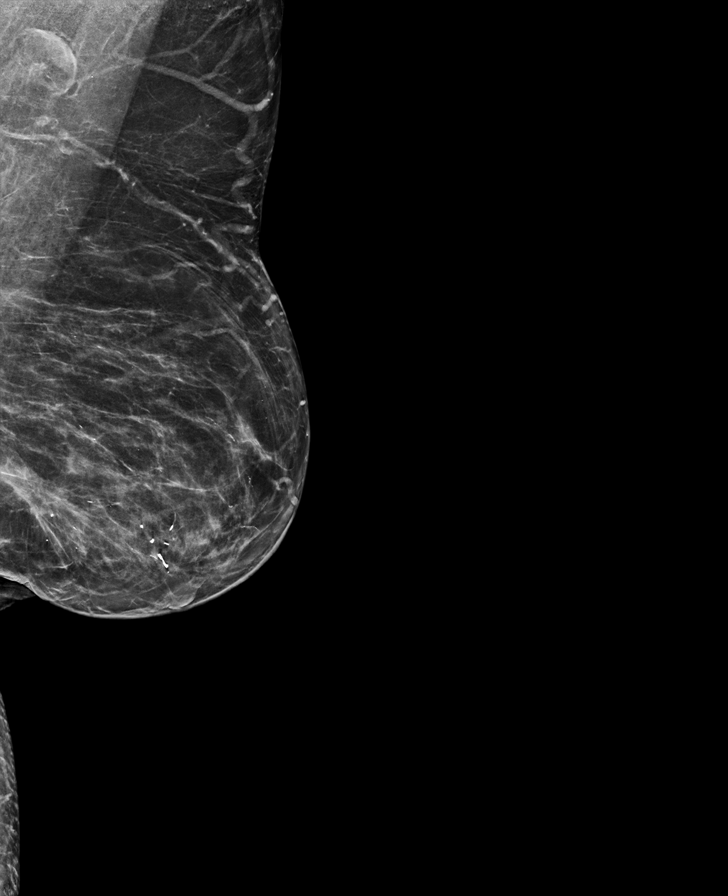

[R CC synth-2D]
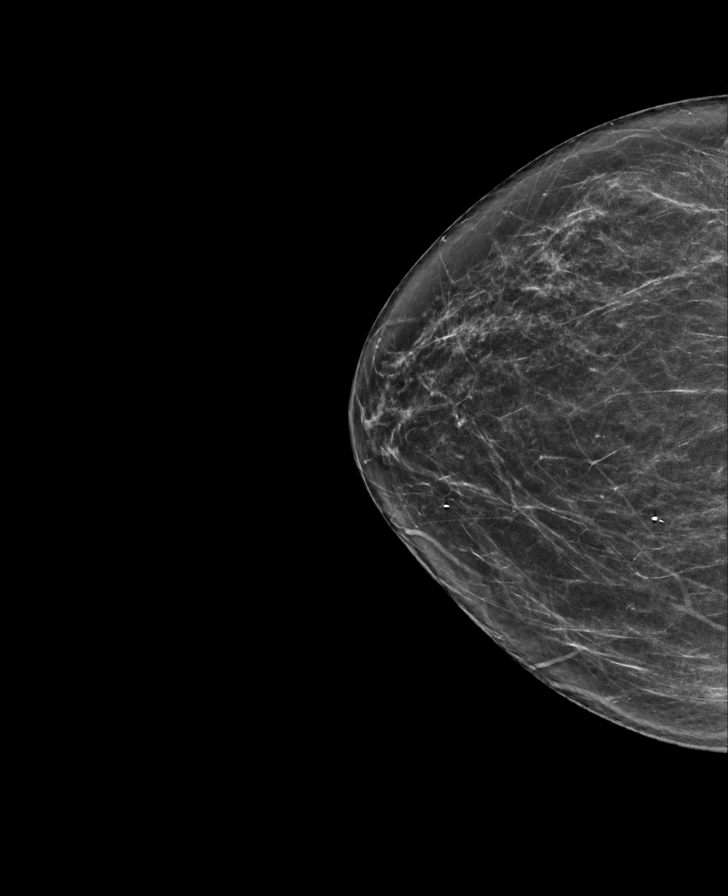

[L CC tomo · tomo slice 33/65.0]
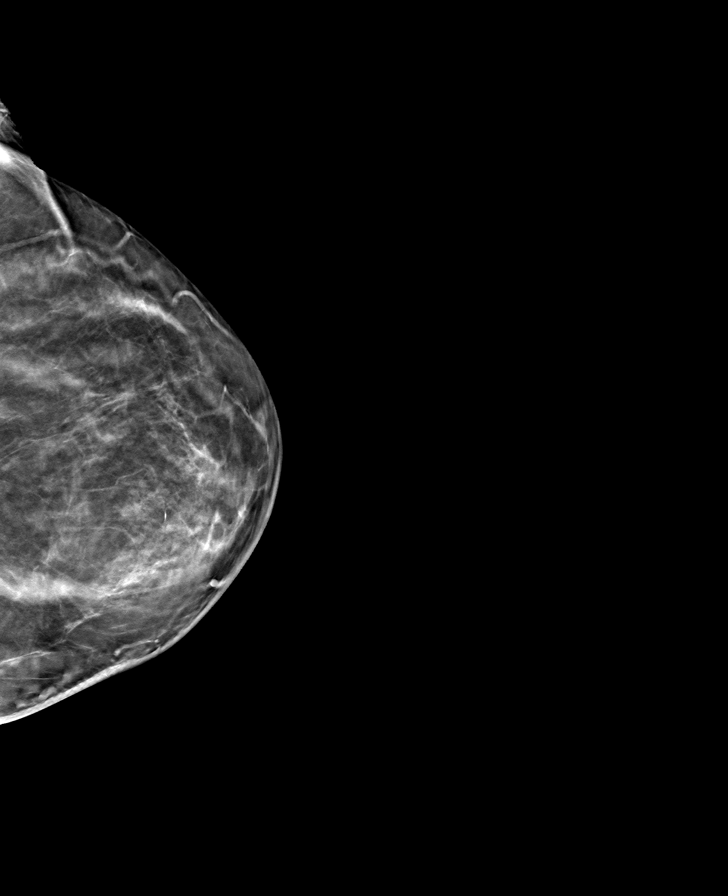

[R MLO tomo · tomo slice 37/74.0]
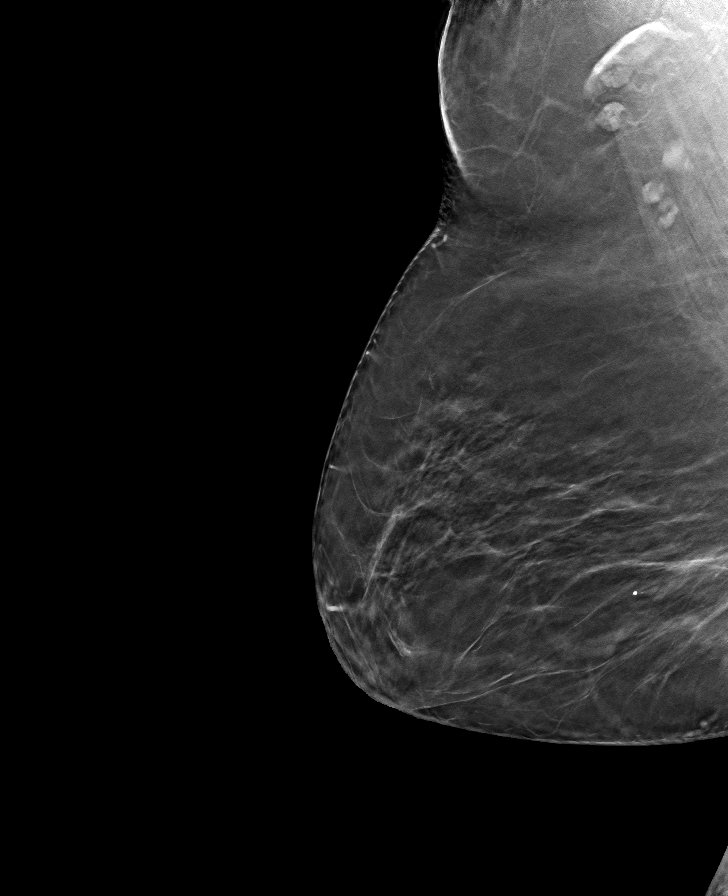

[R CC tomo · tomo slice 33/64.0]
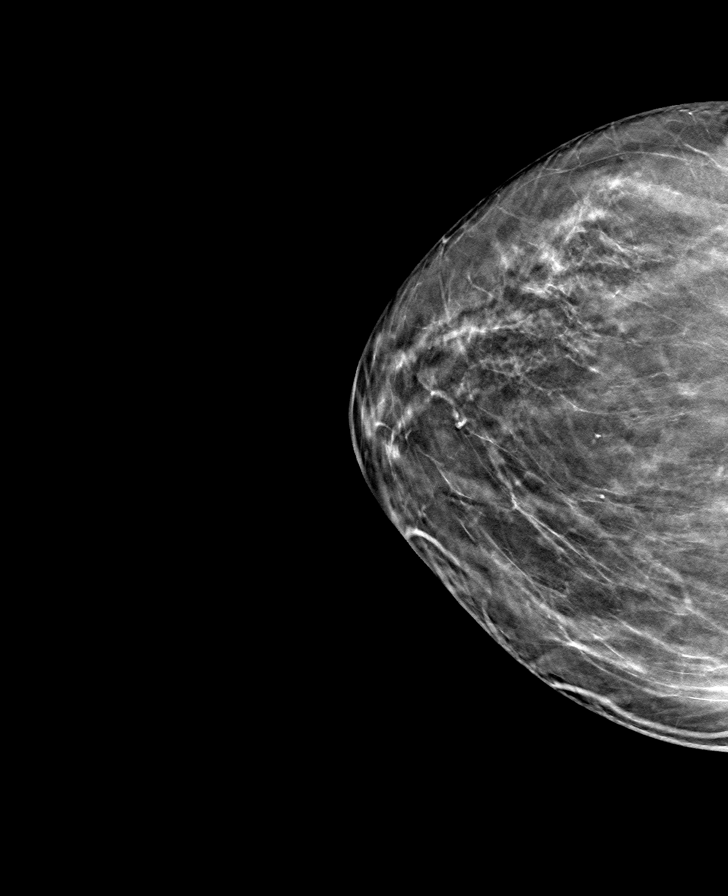

[L MLO tomo · tomo slice 37/73.0]
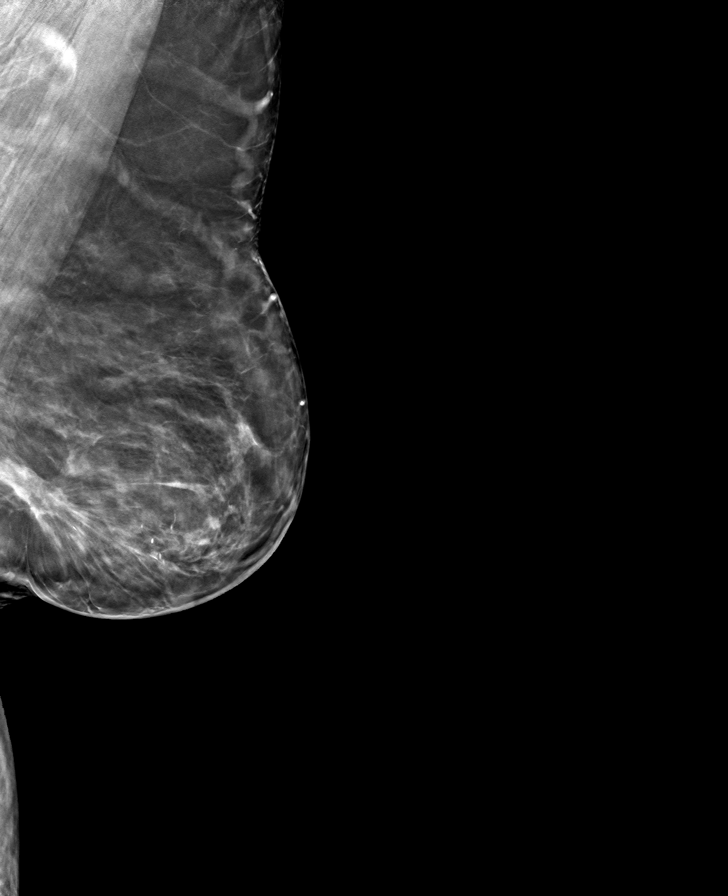

[8 of 24 positions shown; findings below may reference images not displayed]

ACR Breast Density Category b: There are scattered areas of
fibroglandular density.
FINDINGS: There are no findings suspicious for malignancy.
IMPRESSION: No mammographic evidence of malignancy. A result letter of this
screening mammogram will be mailed directly to the patient.

RECOMMENDATION:
Screening mammogram in one year. (Code:51-O-LD2)

BI-RADS CATEGORY  1: Negative.

## 2022-10-17 ENCOUNTER — Emergency Department: Payer: Medicare Other

## 2022-10-17 ENCOUNTER — Other Ambulatory Visit: Payer: Self-pay

## 2022-10-17 ENCOUNTER — Inpatient Hospital Stay
Admission: EM | Admit: 2022-10-17 | Discharge: 2022-10-21 | DRG: 394 | Disposition: A | Discharge status: Home / Self Care | Payer: Medicare Other | Attending: Internal Medicine | Admitting: Internal Medicine

## 2022-10-17 DIAGNOSIS — K5909 Other constipation: Secondary | ICD-10-CM | POA: Diagnosis present

## 2022-10-17 DIAGNOSIS — Z885 Allergy status to narcotic agent status: Secondary | ICD-10-CM

## 2022-10-17 DIAGNOSIS — R0902 Hypoxemia: Secondary | ICD-10-CM | POA: Diagnosis present

## 2022-10-17 DIAGNOSIS — Z1152 Encounter for screening for COVID-19: Secondary | ICD-10-CM | POA: Diagnosis not present

## 2022-10-17 DIAGNOSIS — Z7901 Long term (current) use of anticoagulants: Secondary | ICD-10-CM

## 2022-10-17 DIAGNOSIS — E785 Hyperlipidemia, unspecified: Secondary | ICD-10-CM | POA: Diagnosis present

## 2022-10-17 DIAGNOSIS — Z791 Long term (current) use of non-steroidal anti-inflammatories (NSAID): Secondary | ICD-10-CM

## 2022-10-17 DIAGNOSIS — F32A Depression, unspecified: Secondary | ICD-10-CM | POA: Diagnosis present

## 2022-10-17 DIAGNOSIS — R140 Abdominal distension (gaseous): Secondary | ICD-10-CM | POA: Diagnosis not present

## 2022-10-17 DIAGNOSIS — K55031 Focal (segmental) acute (reversible) ischemia of large intestine: Principal | ICD-10-CM | POA: Diagnosis present

## 2022-10-17 DIAGNOSIS — R109 Unspecified abdominal pain: Principal | ICD-10-CM | POA: Diagnosis present

## 2022-10-17 DIAGNOSIS — R0609 Other forms of dyspnea: Secondary | ICD-10-CM | POA: Diagnosis present

## 2022-10-17 DIAGNOSIS — Z8719 Personal history of other diseases of the digestive system: Secondary | ICD-10-CM | POA: Diagnosis not present

## 2022-10-17 DIAGNOSIS — I480 Paroxysmal atrial fibrillation: Secondary | ICD-10-CM | POA: Diagnosis present

## 2022-10-17 DIAGNOSIS — I10 Essential (primary) hypertension: Secondary | ICD-10-CM | POA: Diagnosis present

## 2022-10-17 DIAGNOSIS — M810 Age-related osteoporosis without current pathological fracture: Secondary | ICD-10-CM | POA: Diagnosis present

## 2022-10-17 DIAGNOSIS — K648 Other hemorrhoids: Secondary | ICD-10-CM | POA: Diagnosis present

## 2022-10-17 DIAGNOSIS — Z86010 Personal history of colonic polyps: Secondary | ICD-10-CM

## 2022-10-17 DIAGNOSIS — K559 Vascular disorder of intestine, unspecified: Secondary | ICD-10-CM | POA: Diagnosis not present

## 2022-10-17 DIAGNOSIS — Z853 Personal history of malignant neoplasm of breast: Secondary | ICD-10-CM

## 2022-10-17 DIAGNOSIS — Z96653 Presence of artificial knee joint, bilateral: Secondary | ICD-10-CM | POA: Diagnosis present

## 2022-10-17 DIAGNOSIS — Z803 Family history of malignant neoplasm of breast: Secondary | ICD-10-CM

## 2022-10-17 DIAGNOSIS — Z6841 Body Mass Index (BMI) 40.0 and over, adult: Secondary | ICD-10-CM

## 2022-10-17 DIAGNOSIS — R1031 Right lower quadrant pain: Secondary | ICD-10-CM

## 2022-10-17 DIAGNOSIS — E876 Hypokalemia: Secondary | ICD-10-CM | POA: Diagnosis present

## 2022-10-17 DIAGNOSIS — K644 Residual hemorrhoidal skin tags: Secondary | ICD-10-CM | POA: Diagnosis present

## 2022-10-17 DIAGNOSIS — R1011 Right upper quadrant pain: Secondary | ICD-10-CM

## 2022-10-17 DIAGNOSIS — K6389 Other specified diseases of intestine: Secondary | ICD-10-CM | POA: Diagnosis not present

## 2022-10-17 DIAGNOSIS — R509 Fever, unspecified: Secondary | ICD-10-CM | POA: Diagnosis present

## 2022-10-17 DIAGNOSIS — Z888 Allergy status to other drugs, medicaments and biological substances status: Secondary | ICD-10-CM | POA: Diagnosis not present

## 2022-10-17 DIAGNOSIS — Z923 Personal history of irradiation: Secondary | ICD-10-CM

## 2022-10-17 DIAGNOSIS — E119 Type 2 diabetes mellitus without complications: Secondary | ICD-10-CM | POA: Diagnosis present

## 2022-10-17 DIAGNOSIS — C7A8 Other malignant neuroendocrine tumors: Secondary | ICD-10-CM | POA: Diagnosis present

## 2022-10-17 DIAGNOSIS — Z79899 Other long term (current) drug therapy: Secondary | ICD-10-CM | POA: Diagnosis not present

## 2022-10-17 DIAGNOSIS — A419 Sepsis, unspecified organism: Secondary | ICD-10-CM | POA: Diagnosis present

## 2022-10-17 DIAGNOSIS — E66813 Obesity, class 3: Secondary | ICD-10-CM | POA: Diagnosis present

## 2022-10-17 DIAGNOSIS — Z880 Allergy status to penicillin: Secondary | ICD-10-CM | POA: Diagnosis not present

## 2022-10-17 DIAGNOSIS — K859 Acute pancreatitis without necrosis or infection, unspecified: Secondary | ICD-10-CM

## 2022-10-17 LAB — CBC
HCT: 36.8 % (ref 36.0–46.0)
Hemoglobin: 12.1 g/dL (ref 12.0–15.0)
MCH: 27.6 pg (ref 26.0–34.0)
MCHC: 32.9 g/dL (ref 30.0–36.0)
MCV: 84 fL (ref 80.0–100.0)
Platelets: 189 10*3/uL (ref 150–400)
RBC: 4.38 MIL/uL (ref 3.87–5.11)
RDW: 13.4 % (ref 11.5–15.5)
WBC: 12.1 10*3/uL — ABNORMAL HIGH (ref 4.0–10.5)
nRBC: 0 % (ref 0.0–0.2)

## 2022-10-17 LAB — HEPATIC FUNCTION PANEL
ALT: 20 U/L (ref 0–44)
AST: 27 U/L (ref 15–41)
Albumin: 3.4 g/dL — ABNORMAL LOW (ref 3.5–5.0)
Alkaline Phosphatase: 55 U/L (ref 38–126)
Bilirubin, Direct: 0.2 mg/dL (ref 0.0–0.2)
Indirect Bilirubin: 0.6 mg/dL (ref 0.3–0.9)
Total Bilirubin: 0.8 mg/dL (ref 0.3–1.2)
Total Protein: 6.6 g/dL (ref 6.5–8.1)

## 2022-10-17 LAB — TROPONIN I (HIGH SENSITIVITY)
Troponin I (High Sensitivity): 15 ng/L (ref <18)
Troponin I (High Sensitivity): 17 ng/L (ref <18)

## 2022-10-17 LAB — BASIC METABOLIC PANEL
Anion gap: 10 (ref 5–15)
BUN: 14 mg/dL (ref 8–23)
CO2: 27 mmol/L (ref 22–32)
Calcium: 9.6 mg/dL (ref 8.9–10.3)
Chloride: 101 mmol/L (ref 98–111)
Creatinine, Ser: 0.89 mg/dL (ref 0.44–1.00)
GFR, Estimated: >60 mL/min (ref >60)
Glucose, Bld: 169 mg/dL — ABNORMAL HIGH (ref 70–99)
Potassium: 3 mmol/L — ABNORMAL LOW (ref 3.5–5.1)
Sodium: 138 mmol/L (ref 135–145)

## 2022-10-17 LAB — LACTIC ACID, PLASMA: Lactic Acid, Venous: 1.2 mmol/L (ref 0.5–1.9)

## 2022-10-17 LAB — LIPASE, BLOOD: Lipase: 26 U/L (ref 11–51)

## 2022-10-17 LAB — RESP PANEL BY RT-PCR (RSV, FLU A&B, COVID)  RVPGX2
Influenza A by PCR: NEGATIVE
Influenza B by PCR: NEGATIVE
Resp Syncytial Virus by PCR: NEGATIVE
SARS Coronavirus 2 by RT PCR: NEGATIVE

## 2022-10-17 MED ORDER — LACTATED RINGERS IV SOLN: INTRAVENOUS | Status: DC

## 2022-10-17 MED ORDER — MORPHINE SULFATE (PF) 4 MG/ML IV SOLN
4.0000 mg | Freq: Once | INTRAVENOUS | Status: DC
  Filled 2022-10-17: qty 1

## 2022-10-17 MED ORDER — VANCOMYCIN HCL IN DEXTROSE 1-5 GM/200ML-% IV SOLN: 1000.0000 mg | Freq: Once | INTRAVENOUS | Status: DC

## 2022-10-17 MED ORDER — MORPHINE SULFATE (PF) 2 MG/ML IV SOLN
2.0000 mg | INTRAVENOUS | Status: DC | PRN
  Administered 2022-10-18 – 2022-10-19 (×3): 2 mg via INTRAVENOUS
  Filled 2022-10-17 (×3): qty 1

## 2022-10-17 MED ORDER — VANCOMYCIN HCL 2000 MG/400ML IV SOLN
2000.0000 mg | Freq: Once | INTRAVENOUS | Status: AC
  Administered 2022-10-17: 2000 mg via INTRAVENOUS
  Filled 2022-10-17: qty 400

## 2022-10-17 MED ORDER — METRONIDAZOLE 500 MG/100ML IV SOLN
500.0000 mg | Freq: Two times a day (BID) | INTRAVENOUS | Status: DC
  Administered 2022-10-18 – 2022-10-21 (×7): 500 mg via INTRAVENOUS
  Filled 2022-10-17 (×8): qty 100

## 2022-10-17 MED ORDER — SODIUM CHLORIDE 0.9 % IV BOLUS
1000.0000 mL | Freq: Once | INTRAVENOUS | Status: AC
  Administered 2022-10-17: 1000 mL via INTRAVENOUS

## 2022-10-17 MED ORDER — ONDANSETRON 4 MG PO TBDP
4.0000 mg | ORAL_TABLET | Freq: Once | ORAL | Status: AC
  Administered 2022-10-17: 4 mg via ORAL
  Filled 2022-10-17: qty 1

## 2022-10-17 MED ORDER — IOHEXOL 350 MG/ML SOLN
100.0000 mL | Freq: Once | INTRAVENOUS | Status: AC | PRN
  Administered 2022-10-17: 100 mL via INTRAVENOUS

## 2022-10-17 MED ORDER — METRONIDAZOLE 500 MG/100ML IV SOLN
500.0000 mg | Freq: Once | INTRAVENOUS | Status: AC
  Administered 2022-10-17: 500 mg via INTRAVENOUS
  Filled 2022-10-17: qty 100

## 2022-10-17 MED ORDER — ACETAMINOPHEN 325 MG PO TABS
650.0000 mg | ORAL_TABLET | Freq: Four times a day (QID) | ORAL | Status: DC | PRN
  Administered 2022-10-18 – 2022-10-21 (×4): 650 mg via ORAL
  Filled 2022-10-17 (×4): qty 2

## 2022-10-17 MED ORDER — SODIUM CHLORIDE 0.9 % IV SOLN
2.0000 g | INTRAVENOUS | Status: AC
  Administered 2022-10-17: 2 g via INTRAVENOUS
  Filled 2022-10-17: qty 12.5

## 2022-10-17 MED ORDER — OXYCODONE-ACETAMINOPHEN 5-325 MG PO TABS
1.0000 | ORAL_TABLET | Freq: Once | ORAL | Status: AC
  Administered 2022-10-17: 1 via ORAL
  Filled 2022-10-17: qty 1

## 2022-10-17 MED ORDER — ACETAMINOPHEN 650 MG RE SUPP: 650.0000 mg | Freq: Four times a day (QID) | RECTAL | Status: DC | PRN

## 2022-10-17 MED ORDER — ONDANSETRON HCL 4 MG PO TABS: 4.0000 mg | ORAL_TABLET | Freq: Four times a day (QID) | ORAL | Status: DC | PRN

## 2022-10-17 MED ORDER — LOSARTAN POTASSIUM 50 MG PO TABS
100.0000 mg | ORAL_TABLET | Freq: Every day | ORAL | Status: DC
  Administered 2022-10-18 – 2022-10-21 (×4): 100 mg via ORAL
  Filled 2022-10-17 (×4): qty 2

## 2022-10-17 MED ORDER — SODIUM CHLORIDE 0.9 % IV SOLN
2.0000 g | Freq: Two times a day (BID) | INTRAVENOUS | Status: DC
  Administered 2022-10-18: 2 g via INTRAVENOUS
  Filled 2022-10-17: qty 12.5

## 2022-10-17 MED ORDER — PRAVASTATIN SODIUM 20 MG PO TABS
20.0000 mg | ORAL_TABLET | Freq: Every day | ORAL | Status: DC
  Administered 2022-10-18 – 2022-10-20 (×3): 20 mg via ORAL
  Filled 2022-10-17 (×3): qty 1

## 2022-10-17 MED ORDER — ONDANSETRON HCL 4 MG/2ML IJ SOLN: 4.0000 mg | Freq: Four times a day (QID) | INTRAMUSCULAR | Status: DC | PRN

## 2022-10-17 MED ORDER — ATENOLOL 25 MG PO TABS
25.0000 mg | ORAL_TABLET | Freq: Every day | ORAL | Status: DC
  Administered 2022-10-18 – 2022-10-21 (×4): 25 mg via ORAL
  Filled 2022-10-17 (×4): qty 1

## 2022-10-17 MED ORDER — SODIUM CHLORIDE 0.9 % IV SOLN: 2.0000 g | Freq: Once | INTRAVENOUS | Status: DC

## 2022-10-17 NOTE — Assessment & Plan Note (Signed)
Continue home atenolol and losartan

## 2022-10-17 NOTE — ED Triage Notes (Signed)
Pt reports last pm started with pain in her right back. Pt reports now the pain has radiated around her right flank and into her right groin area. Pt reports pain is constant and makes her nauseated. Pt reports her urine is now a dribble as well.

## 2022-10-17 NOTE — Sepsis Progress Note (Signed)
Elink monitoring for the code sepsis protocol.  

## 2022-10-17 NOTE — Consult Note (Signed)
CODE SEPSIS - PHARMACY COMMUNICATION  **Broad Spectrum Antibiotics should be administered within 1 hour of Sepsis diagnosis**  Time Code Sepsis Called/Page Received: 2105  Antibiotics Ordered: vancomycin, metronidazole, cefepime  Time of 1st antibiotic administration: 2143     Dorothe Pea ,PharmD Clinical Pharmacist  10/17/2022  9:28 PM

## 2022-10-17 NOTE — Assessment & Plan Note (Addendum)
Abdominal pain Peripancreatic fat stranding Lipase and hepatic function panel within normal limit. GI was consulted.  Colonoscopy most likely on Sunday. Significant tenderness and abdominal distention-General surgery evaluation was also requested. -Continue with supportive care

## 2022-10-17 NOTE — Assessment & Plan Note (Signed)
No acute issues suspected 

## 2022-10-17 NOTE — ED Provider Triage Note (Signed)
Emergency Medicine Provider Triage Evaluation Note  Dominique Moore, a 81 y.o. female  was evaluated in triage.  Pt complains of right flank pain. She noted onset os symptoms in the early morning hours. She now notes radiation to the right flank and groin.  Review of Systems  Positive: Right flank pain Negative: FCS  Physical Exam  BP (!) 142/62 (BP Location: Left Arm)   Pulse 70   Temp 98.6 F (37 C) (Oral)   Resp 20   Ht '5\' 1"'$  (1.549 m)   Wt 103 kg   SpO2 95%   BMI 42.91 kg/m  Gen:   Awake, no distress   Resp:  Normal effort  MSK:   Moves extremities without difficulty  Other:    Medical Decision Making  Medically screening exam initiated at 4:16 PM.  Appropriate orders placed.  Dominique Moore was informed that the remainder of the evaluation will be completed by another provider, this initial triage assessment does not replace that evaluation, and the importance of remaining in the ED until their evaluation is complete.  Patient to the ED for evaluation flank pain and nausea.    Dominique Needles, PA-C 10/17/22 1620

## 2022-10-17 NOTE — Assessment & Plan Note (Signed)
Continue escitalopram

## 2022-10-17 NOTE — H&P (Signed)
History and Physical    Patient: Dominique Moore ION:[id number] DOB: April 10, 1941 DOA: 10/17/2022 DOS: the patient was seen and examined on 10/17/2022 PCP: Baxter Hire, MD  Patient coming from: Home  Chief Complaint:  Chief Complaint  Patient presents with   Abdominal Pain   Flank Pain    HPI: Dominique Moore is a 81 y.o. female with medical history significant for Breast cancer in 2000, HTN, paroxysmal A-fib on anticoagulation with Eliquis, class III obesity, HTN who presents to the ED with a 2-day history of abdominal pain that is worse with moving or taking of deep breath, associated with shortness of breath.  She denies nausea, vomiting or diarrhea or dysuria.  Denies chest pain.  Denied fever ED course and Data review: Originally afebrile but then spiked a temperature of 100.4 and became tachypneic to 24 with O2 sat 85% on room air rebounding on its own to 92%.  SBP in the 130s to 140s and pulse in the 70s.  Labs with WBC 12,000 and lactic acid 1.2.  Respiratory panel negative for COVID flu and RSV.  Lipase normal, LFTs pending.  BMP significant for potassium of 3 and urinalysis pending.  EKG, personally viewed and interpreted showing NSR at 69 with nonspecific ST-T wave changes. CTA chest and CT abdomen and pelvis with contrast significant for 2.1 cm serosal mass of colon and fat stranding around the pancreas as outlined below: IMPRESSION: Chest:   1. No evidence of pulmonary embolus. 2. Bibasilar hypoventilatory changes and trace bilateral effusions. No acute airspace disease. 3. Aortic Atherosclerosis (ICD10-I70.0). Coronary artery atherosclerosis   Abdomen/pelvis:   1. Spiculated soft tissue mass along the serosal surface medial aspect ascending colon, measuring up to 2.7 cm. Differential diagnosis includes primary colonic malignancy, metastatic disease, or gastrointestinal stromal tumor. Further evaluation with PET-CT is recommended. 2. Mild peripancreatic fat  stranding consistent with acute uncomplicated pancreatitis. Diffuse fatty infiltration of the pancreas. 3. Sigmoid diverticulosis without diverticulitis. 4. Small hiatal hernia. 5.  Aortic Atherosclerosis (ICD10-I70.0).  Patient was started on an IV fluid bolus as well as cefepime and vancomycin and Flagyl for sepsis of unknown source and hospitalist consulted for admission.   Review of Systems: As mentioned in the history of present illness. All other systems reviewed and are negative.  Past Medical History:  Diagnosis Date   Breast cancer ([place]) 2000   left breast lumpectomy with rad tx   Complication of anesthesia    Depression    Hyperlipidemia    Hypertension    Osteoporosis    Personal history of radiation therapy    PONV (postoperative nausea and vomiting)    Pre-diabetes    Sleep apnea    no CPAP   Past Surgical History:  Procedure Laterality Date   BREAST BIOPSY Left 2000   positive   BREAST LUMPECTOMY     CATARACT EXTRACTION Bilateral    COLONOSCOPY WITH PROPOFOL N/A 02/22/2016   Procedure: COLONOSCOPY WITH PROPOFOL;  Surgeon: Manya Silvas, MD;  Location: [facility] ENDOSCOPY;  Service: Endoscopy;  Laterality: N/A;   EYE SURGERY     bilateral catracts   JOINT REPLACEMENT     KNEE ARTHROPLASTY Right 08/27/2017   Procedure: COMPUTER ASSISTED TOTAL KNEE ARTHROPLASTY;  Surgeon: Dereck Leep, MD;  Location: [facility] ORS;  Service: Orthopedics;  Laterality: Right;   KNEE ARTHROPLASTY Left 09/20/2020   Procedure: COMPUTER ASSISTED TOTAL KNEE ARTHROPLASTY;  Surgeon: Dereck Leep, MD;  Location: [facility] ORS;  Service: Orthopedics;  Laterality: Left;   Social History:  reports that she has never smoked. She has never used smokeless tobacco. She reports that she does not drink alcohol and does not use drugs.  Allergies  Allergen Reactions   Ace Inhibitors Swelling    Feet swelling   Avelox [Moxifloxacin] Swelling   Codeine Nausea And Vomiting   Penicillins Hives    IgE = 123  (WNL) on 09/07/2020    Family History  Problem Relation Age of Onset   Breast cancer Mother 59    Prior to Admission medications   Medication Sig Start Date End Date Taking? Authorizing Provider  acetaminophen (TYLENOL) 500 MG tablet Take 500 mg by mouth 2 (two) times daily as needed for moderate pain or headache.  Patient not taking: Reported on 03/27/2022    [provider]  amiodarone (PACERONE) 200 MG tablet Take 200 mg by mouth daily. 02/13/22   [provider]  amLODipine (NORVASC) 10 MG tablet Take 10 mg by mouth daily.     [provider]  atenolol (TENORMIN) 25 MG tablet Take 25 mg by mouth daily.     [provider]  azithromycin (ZITHROMAX) 500 MG tablet Take 500 mg by mouth See admin instructions. Take 500 mg 1 hour prior to dental work Patient not taking: Reported on 09/20/2020    [provider]  celecoxib (CELEBREX) 200 MG capsule Take 1 capsule (200 mg total) by mouth 2 (two) times daily. 09/21/20   Reche Dixon, PA-C  citalopram (CELEXA) 20 MG tablet Take 20 mg by mouth daily.    [provider]  clotrimazole (LOTRIMIN) 1 % cream Apply 1 application topically as needed (rash on hands).     [provider]  Coenzyme Q10 (COQ-10) 100 MG CAPS Take 100 mg by mouth daily.    [provider]  doxazosin (CARDURA) 4 MG tablet Take 4 mg by mouth daily.    [provider]  ELIQUIS 5 MG TABS tablet Take 1 tablet by mouth in the morning and at bedtime. 03/15/22   [provider]  enoxaparin (LOVENOX) 40 MG/0.4ML injection Inject 0.4 mLs (40 mg total) into the skin daily for 14 doses. 09/21/20 10/05/20  Reche Dixon, PA-C  furosemide (LASIX) 20 MG tablet Take 20 mg by mouth daily. 02/13/22   [provider]  hydrochlorothiazide (HYDRODIURIL) 25 MG tablet Take 25 mg by mouth daily. 08/15/20   [provider]  losartan (COZAAR) 100 MG tablet Take 100 mg by mouth daily.    [provider]  Omega-3 Fatty Acids (FISH OIL) 1000 MG CAPS Take 1,000 mg by mouth daily.    [provider]  oxyCODONE (OXY IR/ROXICODONE) 5 MG immediate release tablet Take 1-2 tablets (5-10 mg total) by mouth every 4 (four) hours as needed for moderate pain (pain score 4-6). Patient not taking: Reported on 03/27/2022 09/21/20   Reche Dixon, PA-C  pravastatin (PRAVACHOL) 20 MG tablet Take 20 mg by mouth at bedtime.    [provider]  traMADol (ULTRAM) 50 MG tablet Take 1-2 tablets (50-100 mg total) by mouth every 4 (four) hours as needed for moderate pain. Patient not taking: Reported on 03/27/2022 09/21/20   Reche Dixon, PA-C  vitamin B-12 (CYANOCOBALAMIN) 1000 MCG tablet Take 1,000 mcg by mouth daily.    [provider]    Physical Exam: Vitals:   10/17/22 1610 10/17/22 2003 10/17/22 2014 10/17/22 2143  BP: (!) 142/62 139/67  (!) 142/67  Pulse: 70 71  73  Resp: 20 (!) 24  (!) 22  Temp: 98.6 F (37 C) (!) 100.4 F (38 C)  99.9 F (37.7 C)  TempSrc: Oral Oral  Oral  SpO2: 95% (!) 85% 96% 92%  Weight:      Height:       Physical Exam Vitals and nursing note reviewed.  Constitutional:      General: She is not in acute distress.    Appearance: She is ill-appearing.  HENT:     Head: Normocephalic and atraumatic.  Cardiovascular:     Rate and Rhythm: Normal rate and regular rhythm.     Heart sounds: Normal heart sounds.  Pulmonary:     Effort: Tachypnea present. No respiratory distress.     Breath sounds: Normal breath sounds.  Abdominal:     Palpations: Abdomen is soft.     Tenderness: There is abdominal tenderness in the right lower quadrant. There is guarding.  Neurological:     Mental Status: Mental status is at baseline.     Labs on Admission: I have personally reviewed following labs and imaging studies  CBC: Recent Labs  Lab 10/17/22 1610  WBC 12.1*  HGB 12.1  HCT 36.8  MCV 84.0  PLT 696   Basic Metabolic Panel: Recent Labs  Lab  10/17/22 1610  NA 138  K 3.0*  CL 101  CO2 27  GLUCOSE 169*  BUN 14  CREATININE 0.89  CALCIUM 9.6   GFR: Estimated Creatinine Clearance: 56.6 mL/min (by C-G formula based on SCr of 0.89 mg/dL). Liver Function Tests: No results for input(s): "AST", "ALT", "ALKPHOS", "BILITOT", "PROT", "ALBUMIN" in the last 168 hours. Recent Labs  Lab 10/17/22 1610  LIPASE 26   No results for input(s): "AMMONIA" in the last 168 hours. Coagulation Profile: No results for input(s): "INR", "PROTIME" in the last 168 hours. Cardiac Enzymes: No results for input(s): "CKTOTAL", "CKMB", "CKMBINDEX", "TROPONINI" in the last 168 hours. BNP (last 3 results) No results for input(s): "PROBNP" in the last 8760 hours. HbA1C: No results for input(s): "HGBA1C" in the last 72 hours. CBG: No results for input(s): "GLUCAP" in the last 168 hours. Lipid Profile: No results for input(s): "CHOL", "HDL", "LDLCALC", "TRIG", "CHOLHDL", "LDLDIRECT" in the last 72 hours. Thyroid Function Tests: No results for input(s): "TSH", "T4TOTAL", "FREET4", "T3FREE", "THYROIDAB" in the last 72 hours. Anemia Panel: No results for input(s): "VITAMINB12", "FOLATE", "FERRITIN", "TIBC", "IRON", "RETICCTPCT" in the last 72 hours. Urine analysis:    Component Value Date/Time   COLORURINE YELLOW (A) 09/07/2020 1042   APPEARANCEUR CLEAR (A) 09/07/2020 1042   LABSPEC 1.010 09/07/2020 1042   PHURINE 7.0 09/07/2020 1042   GLUCOSEU NEGATIVE 09/07/2020 1042   HGBUR NEGATIVE 09/07/2020 1042   BILIRUBINUR NEGATIVE 09/07/2020 1042   KETONESUR NEGATIVE 09/07/2020 1042   PROTEINUR NEGATIVE 09/07/2020 1042   NITRITE NEGATIVE 09/07/2020 1042   LEUKOCYTESUR TRACE (A) 09/07/2020 1042    Radiological Exams on Admission: CT Angio Chest PE W/Cm &/Or Wo Cm  Result Date: 10/17/2022 CLINICAL DATA:  Right-sided back pain radiating to right flank and inguinal region, nausea EXAM: CT ANGIOGRAPHY CHEST CT ABDOMEN AND PELVIS WITH CONTRAST TECHNIQUE:  Multidetector CT imaging of the chest was performed using the standard protocol during bolus administration of intravenous contrast. Multiplanar CT image reconstructions and MIPs were obtained to evaluate the vascular anatomy. Multidetector CT imaging of the abdomen and pelvis was performed using the standard protocol during bolus administration of intravenous contrast. RADIATION DOSE REDUCTION: This exam was performed  according to the departmental dose-optimization program which includes automated exposure control, adjustment of the mA and/or kV according to patient size and/or use of iterative reconstruction technique. CONTRAST:  131m OMNIPAQUE IOHEXOL 350 MG/ML SOLN COMPARISON:  10/09/2022 FINDINGS: CTA CHEST FINDINGS Cardiovascular: This is a technically adequate evaluation of the pulmonary vasculature. No filling defects or pulmonary emboli. The heart is enlarged without pericardial effusion. No evidence of thoracic aortic aneurysm or dissection. Atherosclerosis of the aorta and coronary vasculature. Mediastinum/Nodes: No enlarged mediastinal, hilar, or axillary lymph nodes. Thyroid gland, trachea, and esophagus demonstrate no significant findings. Small hiatal hernia. Lungs/Pleura: Hypoventilatory changes are seen at the lung bases. Trace bilateral pleural effusions. No airspace disease or pneumothorax. Central airways are patent. Musculoskeletal: No acute or destructive bony lesions. Reconstructed images demonstrate no additional findings. Review of the MIP images confirms the above findings. CT ABDOMEN and PELVIS FINDINGS Hepatobiliary: No focal liver abnormality is seen. No gallstones, gallbladder wall thickening, or biliary dilatation. Pancreas: Diffuse fatty infiltration of the pancreas. There is some mild fat stranding along the ventral aspect of the pancreatic body consistent with acute pancreatitis. No fluid collection, pseudocyst, or abscess. Spleen: Normal in size without focal abnormality.  Adrenals/Urinary Tract: Adrenal glands are unremarkable. Kidneys are normal, without renal calculi, focal lesion, or hydronephrosis. Bladder is unremarkable. Stomach/Bowel: No bowel obstruction or ileus. Scattered colonic diverticulosis without diverticulitis. Normal appendix right lower quadrant. A spiculated soft tissue mass identified along the medial aspect of the ascending colon, measuring 2.3 x 2.7 x 2.1 cm reference image 41/5. Findings are highly concerning for malignancy, including primary colonic malignancy, metastatic disease, or gastrointestinal stromal tumor. There is loss of normal fat plane between this mass and the serosal surface of the ascending colon. Further evaluation with PET CT is recommended. Vascular/Lymphatic: Aortic atherosclerosis. No enlarged abdominal or pelvic lymph nodes. Reproductive: Uterus and bilateral adnexa are unremarkable. Other: No free fluid or free intraperitoneal gas. Small fat containing umbilical hernia. Musculoskeletal: No acute or destructive bony lesions. Reconstructed images demonstrate no additional findings. Review of the MIP images confirms the above findings. IMPRESSION: Chest: 1. No evidence of pulmonary embolus. 2. Bibasilar hypoventilatory changes and trace bilateral effusions. No acute airspace disease. 3. Aortic Atherosclerosis (ICD10-I70.0). Coronary artery atherosclerosis Abdomen/pelvis: 1. Spiculated soft tissue mass along the serosal surface medial aspect ascending colon, measuring up to 2.7 cm. Differential diagnosis includes primary colonic malignancy, metastatic disease, or gastrointestinal stromal tumor. Further evaluation with PET-CT is recommended. 2. Mild peripancreatic fat stranding consistent with acute uncomplicated pancreatitis. Diffuse fatty infiltration of the pancreas. 3. Sigmoid diverticulosis without diverticulitis. 4. Small hiatal hernia. 5.  Aortic Atherosclerosis (ICD10-I70.0). Electronically Signed   By: MRanda NgoM.D.   On:  10/17/2022 21:00   CT Abdomen Pelvis W Contrast  Result Date: 10/17/2022 CLINICAL DATA:  Right-sided back pain radiating to right flank and inguinal region, nausea EXAM: CT ANGIOGRAPHY CHEST CT ABDOMEN AND PELVIS WITH CONTRAST TECHNIQUE: Multidetector CT imaging of the chest was performed using the standard protocol during bolus administration of intravenous contrast. Multiplanar CT image reconstructions and MIPs were obtained to evaluate the vascular anatomy. Multidetector CT imaging of the abdomen and pelvis was performed using the standard protocol during bolus administration of intravenous contrast. RADIATION DOSE REDUCTION: This exam was performed according to the departmental dose-optimization program which includes automated exposure control, adjustment of the mA and/or kV according to patient size and/or use of iterative reconstruction technique. CONTRAST:  105mOMNIPAQUE IOHEXOL 350 MG/ML SOLN COMPARISON:  10/09/2022 FINDINGS: CTA  CHEST FINDINGS Cardiovascular: This is a technically adequate evaluation of the pulmonary vasculature. No filling defects or pulmonary emboli. The heart is enlarged without pericardial effusion. No evidence of thoracic aortic aneurysm or dissection. Atherosclerosis of the aorta and coronary vasculature. Mediastinum/Nodes: No enlarged mediastinal, hilar, or axillary lymph nodes. Thyroid gland, trachea, and esophagus demonstrate no significant findings. Small hiatal hernia. Lungs/Pleura: Hypoventilatory changes are seen at the lung bases. Trace bilateral pleural effusions. No airspace disease or pneumothorax. Central airways are patent. Musculoskeletal: No acute or destructive bony lesions. Reconstructed images demonstrate no additional findings. Review of the MIP images confirms the above findings. CT ABDOMEN and PELVIS FINDINGS Hepatobiliary: No focal liver abnormality is seen. No gallstones, gallbladder wall thickening, or biliary dilatation. Pancreas: Diffuse fatty  infiltration of the pancreas. There is some mild fat stranding along the ventral aspect of the pancreatic body consistent with acute pancreatitis. No fluid collection, pseudocyst, or abscess. Spleen: Normal in size without focal abnormality. Adrenals/Urinary Tract: Adrenal glands are unremarkable. Kidneys are normal, without renal calculi, focal lesion, or hydronephrosis. Bladder is unremarkable. Stomach/Bowel: No bowel obstruction or ileus. Scattered colonic diverticulosis without diverticulitis. Normal appendix right lower quadrant. A spiculated soft tissue mass identified along the medial aspect of the ascending colon, measuring 2.3 x 2.7 x 2.1 cm reference image 41/5. Findings are highly concerning for malignancy, including primary colonic malignancy, metastatic disease, or gastrointestinal stromal tumor. There is loss of normal fat plane between this mass and the serosal surface of the ascending colon. Further evaluation with PET CT is recommended. Vascular/Lymphatic: Aortic atherosclerosis. No enlarged abdominal or pelvic lymph nodes. Reproductive: Uterus and bilateral adnexa are unremarkable. Other: No free fluid or free intraperitoneal gas. Small fat containing umbilical hernia. Musculoskeletal: No acute or destructive bony lesions. Reconstructed images demonstrate no additional findings. Review of the MIP images confirms the above findings. IMPRESSION: Chest: 1. No evidence of pulmonary embolus. 2. Bibasilar hypoventilatory changes and trace bilateral effusions. No acute airspace disease. 3. Aortic Atherosclerosis (ICD10-I70.0). Coronary artery atherosclerosis Abdomen/pelvis: 1. Spiculated soft tissue mass along the serosal surface medial aspect ascending colon, measuring up to 2.7 cm. Differential diagnosis includes primary colonic malignancy, metastatic disease, or gastrointestinal stromal tumor. Further evaluation with PET-CT is recommended. 2. Mild peripancreatic fat stranding consistent with acute  uncomplicated pancreatitis. Diffuse fatty infiltration of the pancreas. 3. Sigmoid diverticulosis without diverticulitis. 4. Small hiatal hernia. 5.  Aortic Atherosclerosis (ICD10-I70.0). Electronically Signed   By: Randa Ngo M.D.   On: 10/17/2022 21:00   CT Renal Stone Study  Result Date: 10/17/2022 CLINICAL DATA:  Abdominal pain, flank pain EXAM: CT ABDOMEN AND PELVIS WITHOUT CONTRAST TECHNIQUE: Multidetector CT imaging of the abdomen and pelvis was performed following the standard protocol without IV contrast. RADIATION DOSE REDUCTION: This exam was performed according to the departmental dose-optimization program which includes automated exposure control, adjustment of the mA and/or kV according to patient size and/or use of iterative reconstruction technique. COMPARISON:  None Available. FINDINGS: Lower chest: There are small linear patchy infiltrates in the lower lung fields, more so on the right side. Heart is enlarged in size. Minimal right pleural effusion is seen. Hepatobiliary: No focal abnormalities are seen. Anterior bulging is seen in the left lobe of liver, possibly within ventral hernia. There is no dilation of bile ducts. Gallbladder is unremarkable. Pancreas: There is fatty infiltration in the pancreas, more so in the head. There is minimal stranding in the fat planes adjacent to head of the pancreas. There are no loculated fluid collections  in or around the pancreas. Spleen: Unremarkable. Adrenals/Urinary Tract: Adrenals are unremarkable. There is no hydronephrosis. There are no renal or ureteral stones. Urinary bladder is not distended. Stomach/Bowel: Small hiatal hernia is seen. Stomach is unremarkable. Small bowel loops are not dilated. Appendix is difficult to visualize. In image 62 of series 5, there is a small caliber tubular structure inseparable from tip of cecum, possibly normal appendix. There is no focal pericecal inflammation. Multiple diverticula are seen in colon without  focal acute diverticulitis. Vascular/Lymphatic: Scattered arterial calcifications are seen. In image 33 of series 2, there is 2.5 x 2.3 cm nodular density adjacent to the medial margin of ascending colon. There are few subcentimeter nodes in retroperitoneum. Reproductive: Unremarkable. Other: There is no ascites or pneumoperitoneum. Umbilical hernia containing fat is seen. Musculoskeletal: Degenerative changes are noted in lumbar spine. IMPRESSION: There is no evidence of intestinal obstruction or pneumoperitoneum. There is no hydronephrosis. Appendix is not dilated. There is 2.5 cm smooth marginated soft tissue density lesion adjacent to the medial margin of ascending colon. Possibility of metastatic lymphadenopathy is not excluded. Short-term follow-up CT abdomen and pelvis in 3 months along with PET-CT if warranted should be considered. If the patient has any known primary malignancy, immediate further evaluation with PET-CT may be considered. There is minimal stranding in fat planes adjacent to the head of the pancreas which may be a normal variation or suggest early changes of acute pancreatitis. Please correlate with laboratory findings. There is no loculated fluid collection in or around the pancreas. Small hiatal hernia. Increased markings in the lower lung fields, more so on the right side may suggest scarring or atelectasis/pneumonia. Minimal right pleural effusion. Diverticulosis of colon without signs of focal diverticulitis. Other findings as described in the body of the report. Electronically Signed   By: Elmer Picker M.D.   On: 10/17/2022 16:49     Data Reviewed: Relevant notes from primary care and specialist visits, past discharge summaries as available in EHR, including Care Everywhere. Prior diagnostic testing as pertinent to current admission diagnoses Updated medications and problem lists for reconciliation ED course, including vitals, labs, imaging, treatment and response to  treatment Triage notes, nursing and pharmacy notes and ED provider's notes Notable results as noted in HPI   Assessment and Plan: Colonic mass Abdominal pain Peripancreatic fat stranding Patient presents with a day of abdominal pain with CT showing a 2.7 cm spiculated soft tissue mass on the serosal surface of the ascending colon with differential including primary colonic malignancy, metastatic disease or GIST Peripancreatic fat stranding seen but lipase normal Follow-up hepatic function panel Will keep n.p.o. IV pain control, antiemetics, IV fluids GI consult  Sepsis ([place]) Patient has fever, tachypnea, leukocytosis Suspect SIRS but will treat empirically for sepsis of intra-abdominal source cefepime and Flagyl IV hydration Follow procalcitonin  Paroxysmal A-fib ([facility]) Continue amiodarone Will hold Eliquis in case of procedure  History of breast cancer No acute issues suspected  Depression Continue escitalopram  Hypertension Continue home atenolol and losartan  Obesity, Class III, BMI 40-49.9 (morbid obesity) ([place]) Potential complicating factor to overall prognosis and care        DVT prophylaxis: SCDs  Consults: GI, Dr. Virgina Jock  Advance Care Planning:   Code Status: Prior   Family Communication: none  Disposition Plan: Back to previous home environment  Severity of Illness: The appropriate patient status for this patient is INPATIENT. Inpatient status is judged to be reasonable and necessary in order to provide the required intensity  of service to ensure the patient's safety. The patient's presenting symptoms, physical exam findings, and initial radiographic and laboratory data in the context of their chronic comorbidities is felt to place them at high risk for further clinical deterioration. Furthermore, it is not anticipated that the patient will be medically stable for discharge from the hospital within 2 midnights of admission.   * I certify that at the  point of admission it is my clinical judgment that the patient will require inpatient hospital care spanning beyond 2 midnights from the point of admission due to high intensity of service, high risk for further deterioration and high frequency of surveillance required.*  Author: Athena Masse, MD 10/17/2022 11:16 PM  For on call review [email or web address].

## 2022-10-17 NOTE — Assessment & Plan Note (Signed)
Continue amiodarone Will hold Eliquis in case of procedure

## 2022-10-17 NOTE — Assessment & Plan Note (Addendum)
Patient has fever, tachypnea, leukocytosis Suspect SIRS but will treat empirically for sepsis of intra-abdominal source Procalcitonin and preliminary blood cultures negative. Switch cefepime to ceftriaxone Continue Flagyl for now

## 2022-10-17 NOTE — Consult Note (Signed)
PHARMACY -  BRIEF ANTIBIOTIC NOTE   Pharmacy has received consult(s) for vancomycin and aztreonam from an ED provider.  The patient's profile has been reviewed for ht/wt/allergies/indication/available labs.    One time order(s) placed for  Vancomycin 2 gram Cefepime 2 gram  Further antibiotics/pharmacy consults should be ordered by admitting physician if indicated.                       Thank you, Dorothe Pea, PharmD, BCPS Clinical Pharmacist   10/17/2022  9:28 PM

## 2022-10-17 NOTE — ED Provider Notes (Signed)
-----------------------------------------   9:05 PM on 10/17/2022 ----------------------------------------- Patient care assumed from Dr. Jori Moll.  Patient CT scans have resulted showing no significant findings in the chest however concern for possible colonic or stromal tumor on the CT of the abdomen/pelvis also fat stranding around the pancreas indicating pancreatitis which could be the cause of the patient's abdominal pain.  Patient however now is febrile to 100.4 she is tachypneic and briefly hypoxic into the 80s placed on 2 L nasal cannula.  Patient's COVID and flu and RSV are all been negative, suspect the hypoxia is likely secondary to pain medication, patient doing well on 2 L nasal cannula.  However given the fever and tachypnea as well as slightly elevated white blood cell count meeting sepsis criteria we will cover with broad-spectrum antibiotics and send blood cultures.  We are still awaiting a urine sample however given the patient's CT findings fever now meeting sepsis criteria I believe the patient will require admission to the hospital service for ongoing workup and treatment.   Harvest Dark, MD 10/17/22 2251

## 2022-10-17 NOTE — ED Provider Notes (Signed)
[facility] Provider Note    Event Date/Time   First MD Initiated Contact with Patient 10/17/22 1836     (approximate)   History   Abdominal Pain and Flank Pain   HPI  Dominique Moore is a 81 y.o. female with past medical history significant for remote breast cancer in 2000, atrial fibrillation on Eliquis, hypertension, hyperlipidemia, presents to the emergency department with abdominal pain.  Woke up this morning with right upper/right-sided abdominal pain.  States that she has severe pain that is worse with movement or taking a deep breath.  States that she has missed a couple of doses of Eliquis including last night.  Denies any history of DVT or PE.  States that her pain is worse when moving and taking a deep breath.  States that she has been lifting heavy furniture recently.  Denies any weakness to her lower legs.  Denies any urinary or bowel incontinence.  Denies dysuria, urinary urgency but does endorse frequency.  Does endorse recent exertional dyspnea.  Physical Exam   Triage Vital Signs: ED Triage Vitals  Enc Vitals Group     BP 10/17/22 1610 (!) 142/62     Pulse Rate 10/17/22 1610 70     Resp 10/17/22 1610 20     Temp 10/17/22 1610 98.6 F (37 C)     Temp Source 10/17/22 1610 Oral     SpO2 10/17/22 1610 95 %     Weight 10/17/22 1608 227 lb 1.2 oz (103 kg)     Height 10/17/22 1608 '5\' 1"'$  (1.549 m)     Head Circumference --      Peak Flow --      Pain Score 10/17/22 1607 10     Pain Loc --      Pain Edu? --      Excl. in [place]? --     Most recent vital signs: Vitals:   10/17/22 1610  BP: (!) 142/62  Pulse: 70  Resp: 20  Temp: 98.6 F (37 C)  SpO2: 95%    Physical Exam Constitutional:      Appearance: She is well-developed.  HENT:     Head: Atraumatic.  Eyes:     Conjunctiva/sclera: Conjunctivae normal.  Cardiovascular:     Rate and Rhythm: Regular rhythm.  Pulmonary:     Effort: No respiratory distress.  Abdominal:      General: There is no distension.     Tenderness: There is abdominal tenderness in the right upper quadrant. There is right CVA tenderness.  Musculoskeletal:        General: Normal range of motion.     Cervical back: Normal range of motion.  Skin:    General: Skin is warm.  Neurological:     Mental Status: She is alert. Mental status is at baseline.     IMPRESSION / MDM / ASSESSMENT AND PLAN / ED COURSE  I reviewed the triage vital signs and the nursing notes.  Differential diagnosis including musculoskeletal, symptomatic cholelithiasis, acute cholecystitis, small bowel obstruction, pyelonephritis, pulmonary embolism, ACS    RADIOLOGY I independently reviewed imaging, my interpretation of imaging: CT abdomen and pelvis without contrast was obtained after initial triage evaluation to evaluate for possible kidney stones.  CT abdomen and pelvis without contrast did not show an obvious reason for her abdominal pain.  Did note 2.5 cm soft tissue density lesion at the ascending colon concerning for possible metastatic lymphadenopathy.  Noted inflammation of the pancreas.  Also  noted small hiatal hernia and increased markings to the lower lung fields on the right side suggesting either atelectasis or pneumonia.  Patient without known metastatic disease or malignancy only remote history of breast cancer.  Did discuss this incidental finding with the patient and her husband.  Discussed that she would need this followed up.  LABS (all labs ordered are listed, but only abnormal results are displayed) Labs interpreted as -  Hypokalemia.  Leukocytosis of 12.1.  Labs Reviewed  BASIC METABOLIC PANEL - Abnormal; Notable for the following components:      Result Value   Potassium 3.0 (*)    Glucose, Bld 169 (*)    All other components within normal limits  CBC - Abnormal; Notable for the following components:   WBC 12.1 (*)    All other components within normal limits  RESP PANEL BY RT-PCR (RSV,  FLU A&B, COVID)  RVPGX2  LIPASE, BLOOD  URINALYSIS, ROUTINE W REFLEX MICROSCOPIC  TROPONIN I (HIGH SENSITIVITY)   Added on a lipase and a urine.  COVID and influenza testing obtained.  Given that the patient has been missing a couple of doses of her Eliquis with pleuritic pain will order CT angiography to further evaluate for the patient's pain.  TREATMENT   IV fluids, Percocet, morphine   PROCEDURES:  Critical Care performed: No  Procedures  Patient's presentation is most consistent with acute presentation with potential threat to life or bodily function.   MEDICATIONS ORDERED IN ED: Medications  morphine (PF) 4 MG/ML injection 4 mg (has no administration in time range)  oxyCODONE-acetaminophen (PERCOCET/ROXICET) 5-325 MG per tablet 1 tablet (1 tablet Oral Given 10/17/22 1617)  ondansetron (ZOFRAN-ODT) disintegrating tablet 4 mg (4 mg Oral Given 10/17/22 1617)  sodium chloride 0.9 % bolus 1,000 mL (1,000 mLs Intravenous Bolus 10/17/22 1910)    FINAL CLINICAL IMPRESSION(S) / ED DIAGNOSES   Final diagnoses:  Right flank pain  Right upper quadrant abdominal pain     Rx / DC Orders   ED Discharge Orders     None        Note:  This document was prepared using Dragon voice recognition software and may include unintentional dictation errors.   Nathaniel Man, MD 10/17/22 [phone]

## 2022-10-17 NOTE — ED Notes (Signed)
Take CT at this time

## 2022-10-17 NOTE — Assessment & Plan Note (Addendum)
Estimated body mass index is 42.91 kg/m as calculated from the following:   Height as of this encounter: '5\' 1"'$  (1.549 m).   Weight as of this encounter: 103 kg.   Potential complicating factor to overall prognosis and care

## 2022-10-18 ENCOUNTER — Encounter: Payer: Self-pay | Admitting: Internal Medicine

## 2022-10-18 DIAGNOSIS — R109 Unspecified abdominal pain: Secondary | ICD-10-CM | POA: Diagnosis not present

## 2022-10-18 DIAGNOSIS — F32A Depression, unspecified: Secondary | ICD-10-CM

## 2022-10-18 DIAGNOSIS — I10 Essential (primary) hypertension: Secondary | ICD-10-CM

## 2022-10-18 DIAGNOSIS — R140 Abdominal distension (gaseous): Secondary | ICD-10-CM

## 2022-10-18 DIAGNOSIS — I480 Paroxysmal atrial fibrillation: Secondary | ICD-10-CM | POA: Diagnosis not present

## 2022-10-18 DIAGNOSIS — K6389 Other specified diseases of intestine: Secondary | ICD-10-CM | POA: Diagnosis not present

## 2022-10-18 LAB — URINALYSIS, COMPLETE (UACMP) WITH MICROSCOPIC
Bilirubin Urine: NEGATIVE
Glucose, UA: NEGATIVE mg/dL
Hgb urine dipstick: NEGATIVE
Ketones, ur: NEGATIVE mg/dL
Nitrite: NEGATIVE
Protein, ur: 100 mg/dL — AB
Specific Gravity, Urine: 1.043 — ABNORMAL HIGH (ref 1.005–1.030)
pH: 5 (ref 5.0–8.0)

## 2022-10-18 LAB — PROCALCITONIN: Procalcitonin: <0.1 ng/mL

## 2022-10-18 LAB — LACTIC ACID, PLASMA: Lactic Acid, Venous: 1.3 mmol/L (ref 0.5–1.9)

## 2022-10-18 LAB — POTASSIUM: Potassium: 3 mmol/L — ABNORMAL LOW (ref 3.5–5.1)

## 2022-10-18 MED ORDER — POTASSIUM CHLORIDE CRYS ER 20 MEQ PO TBCR
40.0000 meq | EXTENDED_RELEASE_TABLET | ORAL | Status: AC
  Administered 2022-10-18: 40 meq via ORAL
  Filled 2022-10-18: qty 2

## 2022-10-18 MED ORDER — ORAL CARE MOUTH RINSE: 15.0000 mL | OROMUCOSAL | Status: DC | PRN

## 2022-10-18 MED ORDER — SODIUM CHLORIDE 0.9 % IV SOLN
2.0000 g | INTRAVENOUS | Status: DC
  Administered 2022-10-18 – 2022-10-20 (×3): 2 g via INTRAVENOUS
  Filled 2022-10-18: qty 2
  Filled 2022-10-18 (×2): qty 20
  Filled 2022-10-18: qty 2

## 2022-10-18 MED ORDER — SODIUM CHLORIDE 0.9 % IV SOLN: INTRAVENOUS | Status: DC | PRN

## 2022-10-18 MED ORDER — POLYETHYLENE GLYCOL 3350 17 G PO PACK
34.0000 g | PACK | Freq: Two times a day (BID) | ORAL | Status: DC
  Administered 2022-10-18 – 2022-10-19 (×3): 34 g via ORAL
  Filled 2022-10-18 (×5): qty 2

## 2022-10-18 MED ORDER — POTASSIUM CHLORIDE CRYS ER 20 MEQ PO TBCR
40.0000 meq | EXTENDED_RELEASE_TABLET | Freq: Once | ORAL | Status: AC
  Administered 2022-10-18: 40 meq via ORAL
  Filled 2022-10-18: qty 2

## 2022-10-18 NOTE — Progress Notes (Signed)
Pharmacy Antibiotic Note  Dominique Moore is a 81 y.o. female admitted on 10/17/2022 with intra-abdominal infection.  Pharmacy has been consulted for Cefepime dosing for 7 days.  Plan: Cefepime 2 gm q12hr per indication & renal fxn.  Pharmacy will continue to follow and will adjust abx dosing whenever warranted.  Temp (24hrs), Avg:99.6 F (37.6 C), Min:98.6 F (37 C), Max:100.4 F (38 C)   Recent Labs  Lab 10/17/22 1610 10/17/22 2129  WBC 12.1*  --   CREATININE 0.89  --   LATICACIDVEN  --  1.2    Estimated Creatinine Clearance: 56.6 mL/min (by C-G formula based on SCr of 0.89 mg/dL).    Allergies  Allergen Reactions   Ace Inhibitors Swelling    Feet swelling   Avelox [Moxifloxacin] Swelling   Codeine Nausea And Vomiting   Penicillins Hives    IgE = 123 (WNL) on 09/07/2020    Antimicrobials this admission: 12/28 Cefepime >> x 7 days 12/28 Flagyl >> x 7 days 12/28 Vancomycin >> x 1 dose  Microbiology results: 12/28 BCx: Pending 12/28 UCx: Pending   Thank you for allowing pharmacy to be a part of this patient's care.  Renda Rolls, PharmD, MBA 10/18/2022 12:01 AM

## 2022-10-18 NOTE — Consult Note (Signed)
Dominique Darby, MD [address]  [place]  [place], [place] [address]  Main: [phone]  Fax: [phone] Pager: [phone]   Consultation  Referring Provider:     No ref. provider found Primary Care Physician:  Baxter Hire, MD Primary Gastroenterologist: Althia Forts       Reason for Consultation: Abdominal pain, ascending colon mass on the CT scan  Date of Admission:  10/17/2022 Date of Consultation:  10/18/2022         HPI:   Dominique Moore is a 81 y.o. female with morbid obesity, hypertension and hyperlipidemia, history of breast cancer in 2000, paroxysmal A-fib on Eliquis presents with 2 days history of right-sided abdominal pain worse on deep breathing and shortness of breath.  She denies any weight loss, nausea, vomiting, diarrhea or constipation.  Patient was found to have fever of 100.4 in the ER, tachypnea with hypoxia.  Found to have mild leukocytosis.  Respiratory panel negative for COVID, flu and RSV.  Serum lipase and LFTs were normal.  Troponins were negative.  She underwent a CT PE protocol, did not reveal any pulmonary embolism, CT abdomen and pelvis revealed a 2.7 cm mass near ascending colon and fat stranding around the pancreas.  She was empirically started on antibiotics for possible intra-abdominal infection.  GI is consulted for possible colon mass.  Patient had been colonoscopy in 2017, found to have small polyps in the ascending colon, removed with forceps.  Pathology showed adenomatous polyps.  She did not have a follow-up colonoscopy since then.  Patient has been hemodynamically stable, low-grade fever.  Eliquis is held since admission Patient reports that her abdomen is generally distended.  She did not have a bowel movement in last 2 days.  She does suffer from chronic constipation and her stools are generally hard.  Patient's husband is bedside.  She takes laxative as needed.  Patient did not pass gas or flatus within last 2 days She reports that  she did not take her Eliquis in last 2 to 3 days because she has not been feeling well  NSAIDs: None  Antiplts/Anticoagulants/Anti thrombotics: Eliquis for history of paroxysmal A-fib  GI Procedures: Colonoscopy 02/22/2016 by Dr. Vira Agar for colon cancer screening - Two diminutive polyps in the ascending colon, removed with a jumbo cold forceps. Resected and retrieved. - Three diminutive polyps in the rectum, removed with a jumbo cold forceps. Resected and retrieved. - Internal hemorrhoids. - The examination was otherwise normal.  DIAGNOSIS:  A. COLON POLYP 2, ASCENDING; COLD BIOPSY:  - TUBULAR ADENOMA (FRAGMENTS).  - NEGATIVE FOR HIGH-GRADE DYSPLASIA AND MALIGNANCY.   B. RECTUM POLYP 3; COLD BIOPSY:  - HYPERPLASTIC POLYP (3).  - NEGATIVE FOR DYSPLASIA AND MALIGNANCY.   Past Medical History:  Diagnosis Date   Breast cancer ([place]) 2000   left breast lumpectomy with rad tx   Complication of anesthesia    Depression    Hyperlipidemia    Hypertension    Osteoporosis    Personal history of radiation therapy    PONV (postoperative nausea and vomiting)    Pre-diabetes    Sleep apnea    no CPAP    Past Surgical History:  Procedure Laterality Date   BREAST BIOPSY Left 2000   positive   BREAST LUMPECTOMY     CATARACT EXTRACTION Bilateral    COLONOSCOPY WITH PROPOFOL N/A 02/22/2016   Procedure: COLONOSCOPY WITH PROPOFOL;  Surgeon: Manya Silvas, MD;  Location: [facility] ENDOSCOPY;  Service: Endoscopy;  Laterality: N/A;   EYE SURGERY     bilateral catracts   JOINT REPLACEMENT     KNEE ARTHROPLASTY Right 08/27/2017   Procedure: COMPUTER ASSISTED TOTAL KNEE ARTHROPLASTY;  Surgeon: Dereck Leep, MD;  Location: [facility] ORS;  Service: Orthopedics;  Laterality: Right;   KNEE ARTHROPLASTY Left 09/20/2020   Procedure: COMPUTER ASSISTED TOTAL KNEE ARTHROPLASTY;  Surgeon: Dereck Leep, MD;  Location: [facility] ORS;  Service: Orthopedics;  Laterality: Left;     Current Facility-Administered  Medications:    acetaminophen (TYLENOL) tablet 650 mg, 650 mg, Oral, Q6H PRN **OR** acetaminophen (TYLENOL) suppository 650 mg, 650 mg, Rectal, Q6H PRN, Athena Masse, MD   atenolol (TENORMIN) tablet 25 mg, 25 mg, Oral, Daily, Judd Gaudier V, MD, 25 mg at 10/18/22 0911   ceFEPIme (MAXIPIME) 2 g in sodium chloride 0.9 % 100 mL IVPB, 2 g, Intravenous, Q12H, Belue, Alver Sorrow, RPH, Last Rate: 200 mL/hr at 10/18/22 0916, 2 g at 10/18/22 0916   losartan (COZAAR) tablet 100 mg, 100 mg, Oral, Daily, Judd Gaudier V, MD, 100 mg at 10/18/22 0911   metroNIDAZOLE (FLAGYL) IVPB 500 mg, 500 mg, Intravenous, Q12H, Athena Masse, MD, Last Rate: 100 mL/hr at 10/18/22 0917, 500 mg at 10/18/22 0917   morphine (PF) 2 MG/ML injection 2 mg, 2 mg, Intravenous, Q2H PRN, Athena Masse, MD, 2 mg at 10/18/22 1638   morphine (PF) 4 MG/ML injection 4 mg, 4 mg, Intravenous, Once, Mumma, Shannon, MD   ondansetron (ZOFRAN) tablet 4 mg, 4 mg, Oral, Q6H PRN **OR** ondansetron (ZOFRAN) injection 4 mg, 4 mg, Intravenous, Q6H PRN, Athena Masse, MD   polyethylene glycol (MIRALAX / GLYCOLAX) packet 34 g, 34 g, Oral, BID, Willodean Leven, Tally Due, MD   pravastatin (PRAVACHOL) tablet 20 mg, 20 mg, Oral, QHS, Athena Masse, MD  Current Outpatient Medications:    acetaminophen (TYLENOL) 500 MG tablet, Take 500 mg by mouth 2 (two) times daily as needed for moderate pain or headache.  (Patient not taking: Reported on 03/27/2022), Disp: , Rfl:    amiodarone (PACERONE) 200 MG tablet, Take 200 mg by mouth daily., Disp: , Rfl:    amLODipine (NORVASC) 10 MG tablet, Take 10 mg by mouth daily. , Disp: , Rfl:    atenolol (TENORMIN) 25 MG tablet, Take 25 mg by mouth daily. , Disp: , Rfl:    azithromycin (ZITHROMAX) 500 MG tablet, Take 500 mg by mouth See admin instructions. Take 500 mg 1 hour prior to dental work (Patient not taking: Reported on 09/20/2020), Disp: , Rfl:    celecoxib (CELEBREX) 200 MG capsule, Take 1 capsule (200 mg total) by mouth  2 (two) times daily., Disp: 60 capsule, Rfl: 1   citalopram (CELEXA) 20 MG tablet, Take 20 mg by mouth daily., Disp: , Rfl:    clotrimazole (LOTRIMIN) 1 % cream, Apply 1 application topically as needed (rash on hands). , Disp: , Rfl:    Coenzyme Q10 (COQ-10) 100 MG CAPS, Take 100 mg by mouth daily., Disp: , Rfl:    doxazosin (CARDURA) 4 MG tablet, Take 4 mg by mouth daily., Disp: , Rfl:    ELIQUIS 5 MG TABS tablet, Take 1 tablet by mouth in the morning and at bedtime., Disp: , Rfl:    enoxaparin (LOVENOX) 40 MG/0.4ML injection, Inject 0.4 mLs (40 mg total) into the skin daily for 14 doses., Disp: 5.6 mL, Rfl: 0   furosemide (LASIX) 20 MG tablet, Take 20 mg by mouth daily., Disp: ,  Rfl:    hydrochlorothiazide (HYDRODIURIL) 25 MG tablet, Take 25 mg by mouth daily., Disp: , Rfl:    losartan (COZAAR) 100 MG tablet, Take 100 mg by mouth daily., Disp: , Rfl:    Omega-3 Fatty Acids (FISH OIL) 1000 MG CAPS, Take 1,000 mg by mouth daily., Disp: , Rfl:    oxyCODONE (OXY IR/ROXICODONE) 5 MG immediate release tablet, Take 1-2 tablets (5-10 mg total) by mouth every 4 (four) hours as needed for moderate pain (pain score 4-6). (Patient not taking: Reported on 03/27/2022), Disp: 30 tablet, Rfl: 0   pravastatin (PRAVACHOL) 20 MG tablet, Take 20 mg by mouth at bedtime., Disp: , Rfl:    traMADol (ULTRAM) 50 MG tablet, Take 1-2 tablets (50-100 mg total) by mouth every 4 (four) hours as needed for moderate pain. (Patient not taking: Reported on 03/27/2022), Disp: 30 tablet, Rfl: 0   vitamin B-12 (CYANOCOBALAMIN) 1000 MCG tablet, Take 1,000 mcg by mouth daily., Disp: , Rfl:    Family History  Problem Relation Age of Onset   Breast cancer Mother 20     Social History   Tobacco Use   Smoking status: Never   Smokeless tobacco: Never  Vaping Use   Vaping Use: Never used  Substance Use Topics   Alcohol use: No   Drug use: No    Allergies as of 10/17/2022 - Review Complete 10/17/2022  Allergen Reaction Noted    Ace inhibitors Swelling 02/21/2016   Avelox [moxifloxacin] Swelling 02/21/2016   Codeine Nausea And Vomiting 02/21/2016   Penicillins Hives 02/21/2016    Review of Systems:    All systems reviewed and negative except where noted in HPI.   Physical Exam:  Vital signs in last 24 hours: Temp:  [98.6 F (37 C)-100.4 F (38 C)] 98.7 F (37.1 C) (12/29 1152) Pulse Rate:  [58-76] 58 (12/29 1152) Resp:  [15-24] 22 (12/29 1152) BP: (120-142)/(62-80) 126/80 (12/29 1152) SpO2:  [85 %-96 %] 90 % (12/29 1152) Weight:  [428 kg] 103 kg (12/28 1608)   General:   Pleasant, cooperative in NAD Head:  Normocephalic and atraumatic. Eyes:   No icterus.   Conjunctiva pink. PERRLA. Ears:  Normal auditory acuity. Neck:  Supple; no masses or thyroidomegaly Lungs: Respirations even and unlabored. Lungs clear to auscultation bilaterally.   No wheezes, crackles, or rhonchi.  Heart:  Regular rate and rhythm;  Without murmur, clicks, rubs or gallops Abdomen:  Soft, distended, tympanic to percussion, right lower quadrant tenderness with voluntary guarding. Normal bowel sounds. No appreciable masses or hepatomegaly.  No rebound or guarding.  Rectal:  Not performed. Msk:  Symmetrical without gross deformities.  Strength generalized weakness Extremities:  Without edema, cyanosis or clubbing. Neurologic:  Alert and oriented x3;  grossly normal neurologically. Skin:  Intact without significant lesions or rashes. Psych:  Alert and cooperative. Normal affect.  LAB RESULTS:    Latest Ref Rng & Units 10/17/2022    4:10 PM 09/20/2020    8:03 AM 09/07/2020   10:42 AM  CBC  WBC 4.0 - 10.5 K/uL 12.1   5.9   Hemoglobin 12.0 - 15.0 g/dL 12.1  12.2  11.7   Hematocrit 36.0 - 46.0 % 36.8  36.0  34.0   Platelets 150 - 400 K/uL 189   191     BMET    Latest Ref Rng & Units 10/17/2022    4:10 PM 09/20/2020    8:03 AM 09/07/2020   10:42 AM  BMP  Glucose 70 - 99  mg/dL 169  174  142   BUN 8 - 23 mg/dL _0 Creatinine 0.44 - 1.00 mg/dL 0.89  0.70  0.79   Sodium 135 - 145 mmol/L 138  139  139   Potassium 3.5 - 5.1 mmol/L 3.0  3.5  3.0   Chloride 98 - 111 mmol/L 101  100  101   CO2 22 - 32 mmol/L 27   29   Calcium 8.9 - 10.3 mg/dL 9.6   8.9     LFT    Latest Ref Rng & Units 10/17/2022    8:50 PM 09/07/2020   10:42 AM 08/13/2017    8:19 AM  Hepatic Function  Total Protein 6.5 - 8.1 g/dL 6.6  7.5  7.2   Albumin 3.5 - 5.0 g/dL 3.4  3.9  3.8   AST 15 - 41 U/L _1 ALT 0 - 44 U/L _2 Alk Phosphatase 38 - 126 U/L 55  69  67   Total Bilirubin 0.3 - 1.2 mg/dL 0.8  0.7  0.7   Bilirubin, Direct 0.0 - 0.2 mg/dL 0.2        STUDIES: CT Angio Chest PE W/Cm &/Or Wo Cm  Result Date: 10/17/2022 CLINICAL DATA:  Right-sided back pain radiating to right flank and inguinal region, nausea EXAM: CT ANGIOGRAPHY CHEST CT ABDOMEN AND PELVIS WITH CONTRAST TECHNIQUE: Multidetector CT imaging of the chest was performed using the standard protocol during bolus administration of intravenous contrast. Multiplanar CT image reconstructions and MIPs were obtained to evaluate the vascular anatomy. Multidetector CT imaging of the abdomen and pelvis was performed using the standard protocol during bolus administration of intravenous contrast. RADIATION DOSE REDUCTION: This exam was performed according to the departmental dose-optimization program which includes automated exposure control, adjustment of the mA and/or kV according to patient size and/or use of iterative reconstruction technique. CONTRAST:  154m OMNIPAQUE IOHEXOL 350 MG/ML SOLN COMPARISON:  10/09/2022 FINDINGS: CTA CHEST FINDINGS Cardiovascular: This is a technically adequate evaluation of the pulmonary vasculature. No filling defects or pulmonary emboli. The heart is enlarged without pericardial effusion. No evidence of thoracic aortic aneurysm or dissection. Atherosclerosis of the aorta and coronary vasculature. Mediastinum/Nodes: No enlarged  mediastinal, hilar, or axillary lymph nodes. Thyroid gland, trachea, and esophagus demonstrate no significant findings. Small hiatal hernia. Lungs/Pleura: Hypoventilatory changes are seen at the lung bases. Trace bilateral pleural effusions. No airspace disease or pneumothorax. Central airways are patent. Musculoskeletal: No acute or destructive bony lesions. Reconstructed images demonstrate no additional findings. Review of the MIP images confirms the above findings. CT ABDOMEN and PELVIS FINDINGS Hepatobiliary: No focal liver abnormality is seen. No gallstones, gallbladder wall thickening, or biliary dilatation. Pancreas: Diffuse fatty infiltration of the pancreas. There is some mild fat stranding along the ventral aspect of the pancreatic body consistent with acute pancreatitis. No fluid collection, pseudocyst, or abscess. Spleen: Normal in size without focal abnormality. Adrenals/Urinary Tract: Adrenal glands are unremarkable. Kidneys are normal, without renal calculi, focal lesion, or hydronephrosis. Bladder is unremarkable. Stomach/Bowel: No bowel obstruction or ileus. Scattered colonic diverticulosis without diverticulitis. Normal appendix right lower quadrant. A spiculated soft tissue mass identified along the medial aspect of the ascending colon, measuring 2.3 x 2.7 x 2.1 cm reference image 41/5. Findings are highly concerning for malignancy, including primary colonic malignancy, metastatic disease, or gastrointestinal stromal tumor. There is loss of normal fat plane between this mass and the  serosal surface of the ascending colon. Further evaluation with PET CT is recommended. Vascular/Lymphatic: Aortic atherosclerosis. No enlarged abdominal or pelvic lymph nodes. Reproductive: Uterus and bilateral adnexa are unremarkable. Other: No free fluid or free intraperitoneal gas. Small fat containing umbilical hernia. Musculoskeletal: No acute or destructive bony lesions. Reconstructed images demonstrate no  additional findings. Review of the MIP images confirms the above findings. IMPRESSION: Chest: 1. No evidence of pulmonary embolus. 2. Bibasilar hypoventilatory changes and trace bilateral effusions. No acute airspace disease. 3. Aortic Atherosclerosis (ICD10-I70.0). Coronary artery atherosclerosis Abdomen/pelvis: 1. Spiculated soft tissue mass along the serosal surface medial aspect ascending colon, measuring up to 2.7 cm. Differential diagnosis includes primary colonic malignancy, metastatic disease, or gastrointestinal stromal tumor. Further evaluation with PET-CT is recommended. 2. Mild peripancreatic fat stranding consistent with acute uncomplicated pancreatitis. Diffuse fatty infiltration of the pancreas. 3. Sigmoid diverticulosis without diverticulitis. 4. Small hiatal hernia. 5.  Aortic Atherosclerosis (ICD10-I70.0). Electronically Signed   By: Randa Ngo M.D.   On: 10/17/2022 21:00   CT Abdomen Pelvis W Contrast  Result Date: 10/17/2022 CLINICAL DATA:  Right-sided back pain radiating to right flank and inguinal region, nausea EXAM: CT ANGIOGRAPHY CHEST CT ABDOMEN AND PELVIS WITH CONTRAST TECHNIQUE: Multidetector CT imaging of the chest was performed using the standard protocol during bolus administration of intravenous contrast. Multiplanar CT image reconstructions and MIPs were obtained to evaluate the vascular anatomy. Multidetector CT imaging of the abdomen and pelvis was performed using the standard protocol during bolus administration of intravenous contrast. RADIATION DOSE REDUCTION: This exam was performed according to the departmental dose-optimization program which includes automated exposure control, adjustment of the mA and/or kV according to patient size and/or use of iterative reconstruction technique. CONTRAST:  142m OMNIPAQUE IOHEXOL 350 MG/ML SOLN COMPARISON:  10/09/2022 FINDINGS: CTA CHEST FINDINGS Cardiovascular: This is a technically adequate evaluation of the pulmonary  vasculature. No filling defects or pulmonary emboli. The heart is enlarged without pericardial effusion. No evidence of thoracic aortic aneurysm or dissection. Atherosclerosis of the aorta and coronary vasculature. Mediastinum/Nodes: No enlarged mediastinal, hilar, or axillary lymph nodes. Thyroid gland, trachea, and esophagus demonstrate no significant findings. Small hiatal hernia. Lungs/Pleura: Hypoventilatory changes are seen at the lung bases. Trace bilateral pleural effusions. No airspace disease or pneumothorax. Central airways are patent. Musculoskeletal: No acute or destructive bony lesions. Reconstructed images demonstrate no additional findings. Review of the MIP images confirms the above findings. CT ABDOMEN and PELVIS FINDINGS Hepatobiliary: No focal liver abnormality is seen. No gallstones, gallbladder wall thickening, or biliary dilatation. Pancreas: Diffuse fatty infiltration of the pancreas. There is some mild fat stranding along the ventral aspect of the pancreatic body consistent with acute pancreatitis. No fluid collection, pseudocyst, or abscess. Spleen: Normal in size without focal abnormality. Adrenals/Urinary Tract: Adrenal glands are unremarkable. Kidneys are normal, without renal calculi, focal lesion, or hydronephrosis. Bladder is unremarkable. Stomach/Bowel: No bowel obstruction or ileus. Scattered colonic diverticulosis without diverticulitis. Normal appendix right lower quadrant. A spiculated soft tissue mass identified along the medial aspect of the ascending colon, measuring 2.3 x 2.7 x 2.1 cm reference image 41/5. Findings are highly concerning for malignancy, including primary colonic malignancy, metastatic disease, or gastrointestinal stromal tumor. There is loss of normal fat plane between this mass and the serosal surface of the ascending colon. Further evaluation with PET CT is recommended. Vascular/Lymphatic: Aortic atherosclerosis. No enlarged abdominal or pelvic lymph nodes.  Reproductive: Uterus and bilateral adnexa are unremarkable. Other: No free fluid or free intraperitoneal gas. Small  fat containing umbilical hernia. Musculoskeletal: No acute or destructive bony lesions. Reconstructed images demonstrate no additional findings. Review of the MIP images confirms the above findings. IMPRESSION: Chest: 1. No evidence of pulmonary embolus. 2. Bibasilar hypoventilatory changes and trace bilateral effusions. No acute airspace disease. 3. Aortic Atherosclerosis (ICD10-I70.0). Coronary artery atherosclerosis Abdomen/pelvis: 1. Spiculated soft tissue mass along the serosal surface medial aspect ascending colon, measuring up to 2.7 cm. Differential diagnosis includes primary colonic malignancy, metastatic disease, or gastrointestinal stromal tumor. Further evaluation with PET-CT is recommended. 2. Mild peripancreatic fat stranding consistent with acute uncomplicated pancreatitis. Diffuse fatty infiltration of the pancreas. 3. Sigmoid diverticulosis without diverticulitis. 4. Small hiatal hernia. 5.  Aortic Atherosclerosis (ICD10-I70.0). Electronically Signed   By: Randa Ngo M.D.   On: 10/17/2022 21:00   CT Renal Stone Study  Result Date: 10/17/2022 CLINICAL DATA:  Abdominal pain, flank pain EXAM: CT ABDOMEN AND PELVIS WITHOUT CONTRAST TECHNIQUE: Multidetector CT imaging of the abdomen and pelvis was performed following the standard protocol without IV contrast. RADIATION DOSE REDUCTION: This exam was performed according to the departmental dose-optimization program which includes automated exposure control, adjustment of the mA and/or kV according to patient size and/or use of iterative reconstruction technique. COMPARISON:  None Available. FINDINGS: Lower chest: There are small linear patchy infiltrates in the lower lung fields, more so on the right side. Heart is enlarged in size. Minimal right pleural effusion is seen. Hepatobiliary: No focal abnormalities are seen. Anterior  bulging is seen in the left lobe of liver, possibly within ventral hernia. There is no dilation of bile ducts. Gallbladder is unremarkable. Pancreas: There is fatty infiltration in the pancreas, more so in the head. There is minimal stranding in the fat planes adjacent to head of the pancreas. There are no loculated fluid collections in or around the pancreas. Spleen: Unremarkable. Adrenals/Urinary Tract: Adrenals are unremarkable. There is no hydronephrosis. There are no renal or ureteral stones. Urinary bladder is not distended. Stomach/Bowel: Small hiatal hernia is seen. Stomach is unremarkable. Small bowel loops are not dilated. Appendix is difficult to visualize. In image 62 of series 5, there is a small caliber tubular structure inseparable from tip of cecum, possibly normal appendix. There is no focal pericecal inflammation. Multiple diverticula are seen in colon without focal acute diverticulitis. Vascular/Lymphatic: Scattered arterial calcifications are seen. In image 33 of series 2, there is 2.5 x 2.3 cm nodular density adjacent to the medial margin of ascending colon. There are few subcentimeter nodes in retroperitoneum. Reproductive: Unremarkable. Other: There is no ascites or pneumoperitoneum. Umbilical hernia containing fat is seen. Musculoskeletal: Degenerative changes are noted in lumbar spine. IMPRESSION: There is no evidence of intestinal obstruction or pneumoperitoneum. There is no hydronephrosis. Appendix is not dilated. There is 2.5 cm smooth marginated soft tissue density lesion adjacent to the medial margin of ascending colon. Possibility of metastatic lymphadenopathy is not excluded. Short-term follow-up CT abdomen and pelvis in 3 months along with PET-CT if warranted should be considered. If the patient has any known primary malignancy, immediate further evaluation with PET-CT may be considered. There is minimal stranding in fat planes adjacent to the head of the pancreas which may be a  normal variation or suggest early changes of acute pancreatitis. Please correlate with laboratory findings. There is no loculated fluid collection in or around the pancreas. Small hiatal hernia. Increased markings in the lower lung fields, more so on the right side may suggest scarring or atelectasis/pneumonia. Minimal right pleural effusion. Diverticulosis of colon  without signs of focal diverticulitis. Other findings as described in the body of the report. Electronically Signed   By: Elmer Picker M.D.   On: 10/17/2022 16:49      Impression / Plan:   Dominique Moore is a 81 y.o. female with history of morbid obesity, hypertension, hyperlipidemia, breast cancer, paroxysmal A-fib on Eliquis, adenomatous polyps of ascending colon, chronic constipation is admitted with right-sided abdominal pain, abdominal distention with shortness of breath.  CT PE protocol negative.  Found to have mild sepsis, started on empiric antibiotics.  CT abdomen pelvis revealed possible ascending colon mass and peripancreatic stranding.  Serum lipase levels were normal.  I personally reviewed CT images and she does have large amount of stool in her right colon  Right-sided abdominal pain with distention CT revealed possible ascending colon mass Recommend general surgery consult because of tenderness right lower quadrant tenderness and significant abdominal distention Recommend to start slow bowel regimen, ordered MiraLAX 34 g twice daily.  If patient is able to tolerate this and starts moving her bowels, she can have bowel prep tomorrow with tentative plan for colonoscopy on 12/31 Okay to start clear liquid diet only Monitor electrolytes closely, maintain potassium above 4, magnesium above 2 and phosphorus above 3 Patient is on Eliquis for history of A-fib, she states that at her last dose was about 2 to 3 days ago   Thank you for involving me in the care of this patient.      LOS: 1 day   Sherri Sear, MD   10/18/2022, 1:12 PM    Note: This dictation was prepared with Dragon dictation along with smaller phrase technology. Any transcriptional errors that result from this process are unintentional.

## 2022-10-18 NOTE — Progress Notes (Signed)
Progress Note   Patient: Dominique Moore DOB: [birth year] DOA: 10/17/2022     1 DOS: the patient was seen and examined on 10/18/2022   Brief hospital course: Taken from H&P.  Dominique Moore is a 81 y.o. female with medical history significant for Breast cancer in 2000, HTN, paroxysmal A-fib on anticoagulation with Eliquis, class III obesity, HTN who presents to the ED with a 2-day history of abdominal pain that is worse with moving or taking of deep breath, associated with shortness of breath.   ED course and Data review: Originally afebrile but then spiked a temperature of 100.4 and became tachypneic to 24 with O2 sat 85% on room air rebounding on its own to 92%.  SBP in the 130s to 140s and pulse in the 70s.  Labs with WBC 12,000 and lactic acid 1.2.  Respiratory panel negative for COVID flu and RSV.  Lipase normal, LFTs pending.  BMP significant for potassium of 3 and urinalysis pending.  EKG, personally viewed and interpreted showing NSR at 69 with nonspecific ST-T wave changes. CTA chest and CT abdomen and pelvis with contrast significant for 2.1 cm serosal mass of colon and fat stranding around the pancreas as outlined below: IMPRESSION: Chest:   1. No evidence of pulmonary embolus. 2. Bibasilar hypoventilatory changes and trace bilateral effusions. No acute airspace disease. 3. Aortic Atherosclerosis (ICD10-I70.0). Coronary artery atherosclerosis   Abdomen/pelvis:   1. Spiculated soft tissue mass along the serosal surface medial aspect ascending colon, measuring up to 2.7 cm. Differential diagnosis includes primary colonic malignancy, metastatic disease, or gastrointestinal stromal tumor. Further evaluation with PET-CT is recommended. 2. Mild peripancreatic fat stranding consistent with acute uncomplicated pancreatitis. Diffuse fatty infiltration of the pancreas. 3. Sigmoid diverticulosis without diverticulitis. 4. Small hiatal hernia. 5.  Aortic Atherosclerosis  (ICD10-I70.0).  She was given IV fluid and started on broad-spectrum antibiotics for concern of sepsis secondary to intra-abdominal infection. GI was consulted.  12/29: Afebrile this morning, preliminary blood cultures negative and 12 hours.  Procalcitonin negative.  Significant abdominal pain and tenderness especially on right mid to lower quadrant. GI is planning colonoscopy on Sunday.  Also requested general surgery evaluation. Switching cefepime with ceftriaxone, will continue Flagyl for now.     Assessment and Plan: * Right-sided abdominal pain of unknown etiology Patient presents with a day of abdominal pain with CT showing a 2.7 cm spiculated soft tissue mass on the serosal surface of the ascending colon with differential including primary colonic malignancy, metastatic disease or GIST Peripancreatic fat stranding seen but lipase and hepatic function panel normal. Procalcitonin and preliminary blood cultures negative. 1 febrile episode. GI was consulted -Continue with supportive care  Colonic mass Abdominal pain Peripancreatic fat stranding Lipase and hepatic function panel within normal limit. GI was consulted.  Colonoscopy most likely on Sunday. Significant tenderness and abdominal distention-General surgery evaluation was also requested. -Continue with supportive care  Sepsis [facility]) Patient has fever, tachypnea, leukocytosis Suspect SIRS but will treat empirically for sepsis of intra-abdominal source Procalcitonin and preliminary blood cultures negative. Switch cefepime to ceftriaxone Continue Flagyl for now  Paroxysmal A-fib (HCC) Continue amiodarone Will hold Eliquis in case of procedure  History of breast cancer No acute issues suspected  Depression Continue escitalopram  Hypertension Continue home atenolol and losartan  Obesity, Class III, BMI 40-49.9 (morbid obesity) ([place]) Estimated body mass index is 42.91 kg/m as calculated from the following:    Height as of this encounter: '5\' 1"'$  (1.549 m).  Weight as of this encounter: 103 kg.   Potential complicating factor to overall prognosis and care   Subjective: Patient continued to have significant abdominal pain, more on right mid to lower zone.  Had colonoscopy many years ago, per patient she was keep getting reminders but she was ignoring them.  No nausea, vomiting or diarrhea.  Physical Exam: Vitals:   10/18/22 0422 10/18/22 0800 10/18/22 0900 10/18/22 1152  BP: 120/70 122/75  126/80  Pulse: 76 71  (!) 58  Resp: (!) 23 18  (!) 22  Temp: 99.7 F (37.6 C) 98.9 F (37.2 C)  98.7 F (37.1 C)  TempSrc: Oral Oral  Oral  SpO2: 92% 96% 92% 90%  Weight:      Height:       General.  Morbidly obese lady, in no acute distress. Pulmonary.  Lungs clear bilaterally, normal respiratory effort. CV.  Regular rate and rhythm, no JVD, rub or murmur. Abdomen.  Tense, diffusely tender more on right lower quadrant, distended, BS positive. CNS.  Alert and oriented .  No focal neurologic deficit. Extremities.  No edema, no cyanosis, pulses intact and symmetrical. Psychiatry.  Judgment and insight appears normal.   Data Reviewed: Prior data reviewed  Family Communication: Discussed with husband at bedside  Disposition: Status is: Inpatient Remains inpatient appropriate because: Severity of illness  Planned Discharge Destination: Home  DVT prophylaxis.  SCDs Time spent: 45 minutes  This record has been created using Systems analyst. Errors have been sought and corrected,but may not always be located. Such creation errors do not reflect on the standard of care.   Author: Lorella Nimrod, MD 10/18/2022 1:34 PM  For on call review [email or web address].

## 2022-10-18 NOTE — Assessment & Plan Note (Signed)
Patient presents with a day of abdominal pain with CT showing a 2.7 cm spiculated soft tissue mass on the serosal surface of the ascending colon with differential including primary colonic malignancy, metastatic disease or GIST Peripancreatic fat stranding seen but lipase and hepatic function panel normal. Procalcitonin and preliminary blood cultures negative. 1 febrile episode. GI was consulted -Continue with supportive care

## 2022-10-18 NOTE — Consult Note (Signed)
Subjective:   CC: Colon mass  HPI:  Dominique Moore is a 81 y.o. female who was consulted by [facility] for issue above.  Symptoms were first noted a few days ago. Pain is sharp, confined to the right lower quadrant, without radiation.  Associated with nothing specific, exacerbated by exertion   Past Medical History:  has a past medical history of Breast cancer ([place]) (4132), Complication of anesthesia, Depression, Hyperlipidemia, Hypertension, Osteoporosis, Personal history of radiation therapy, PONV (postoperative nausea and vomiting), Pre-diabetes, and Sleep apnea.  Past Surgical History:  Past Surgical History:  Procedure Laterality Date   BREAST BIOPSY Left 2000   positive   BREAST LUMPECTOMY     CATARACT EXTRACTION Bilateral    COLONOSCOPY WITH PROPOFOL N/A 02/22/2016   Procedure: COLONOSCOPY WITH PROPOFOL;  Surgeon: Manya Silvas, MD;  Location: [facility] ENDOSCOPY;  Service: Endoscopy;  Laterality: N/A;   EYE SURGERY     bilateral catracts   JOINT REPLACEMENT     KNEE ARTHROPLASTY Right 08/27/2017   Procedure: COMPUTER ASSISTED TOTAL KNEE ARTHROPLASTY;  Surgeon: Dereck Leep, MD;  Location: [facility] ORS;  Service: Orthopedics;  Laterality: Right;   KNEE ARTHROPLASTY Left 09/20/2020   Procedure: COMPUTER ASSISTED TOTAL KNEE ARTHROPLASTY;  Surgeon: Dereck Leep, MD;  Location: [facility] ORS;  Service: Orthopedics;  Laterality: Left;    Family History: family history includes Breast cancer (age of onset: 66) in her mother.  Social History:  reports that she has never smoked. She has never used smokeless tobacco. She reports that she does not drink alcohol and does not use drugs.  Current Medications:  Prior to Admission medications   Medication Sig Start Date End Date Taking? Authorizing Provider  amiodarone (PACERONE) 200 MG tablet Take 200 mg by mouth 2 (two) times daily.   Yes [provider]  amLODipine (NORVASC) 10 MG tablet Take 10 mg by mouth daily.    Yes [provider]  apixaban (ELIQUIS) 5 MG TABS tablet Take 5 mg by mouth 2 (two) times daily.   Yes [provider]  atenolol (TENORMIN) 25 MG tablet Take 25 mg by mouth daily.    Yes [provider]  citalopram (CELEXA) 20 MG tablet Take 20 mg by mouth daily.   Yes [provider]  clotrimazole (LOTRIMIN) 1 % cream Apply 1 application topically as needed (rash on hands).    Yes [provider]  doxazosin (CARDURA) 4 MG tablet Take 4 mg by mouth daily.   Yes [provider]  furosemide (LASIX) 20 MG tablet Take 20 mg by mouth daily.   Yes [provider]  hydrochlorothiazide (HYDRODIURIL) 25 MG tablet Take 25 mg by mouth daily.   Yes [provider]  losartan (COZAAR) 100 MG tablet Take 100 mg by mouth daily.   Yes [provider]  potassium chloride SA (KLOR-CON M) 20 MEQ tablet Take 20 mEq by mouth daily.   Yes [provider]  pravastatin (PRAVACHOL) 20 MG tablet Take 20 mg by mouth at bedtime.   Yes [provider]    Allergies:  Allergies as of 10/17/2022 - Review Complete 10/17/2022  Allergen Reaction Noted   Ace inhibitors Swelling 02/21/2016   Avelox [moxifloxacin] Swelling 02/21/2016   Codeine Nausea And Vomiting 02/21/2016   Penicillins Hives 02/21/2016    ROS:  General: Denies weight loss, weight gain, fatigue, fevers, chills, and night sweats. Eyes: Denies blurry vision, double vision, eye pain, itchy eyes, and tearing. Ears: Denies hearing  loss, earache, and ringing in ears. Nose: Denies sinus pain, congestion, infections, runny nose, and nosebleeds. Mouth/throat: Denies hoarseness, sore throat, bleeding gums, and difficulty swallowing. Heart: Denies chest pain, palpitations, racing heart, irregular heartbeat, leg pain or swelling, and decreased activity tolerance. Respiratory: Denies breathing difficulty, shortness of breath, wheezing, cough, and sputum. GI: Denies change in appetite, heartburn,  nausea, vomiting, constipation, diarrhea, and blood in stool. GU: Denies difficulty urinating, pain with urinating, urgency, frequency, blood in urine. Musculoskeletal: Denies joint stiffness, pain, swelling, muscle weakness. Skin: Denies rash, itching, mass, tumors, sores, and boils Neurologic: Denies headache, fainting, dizziness, seizures, numbness, and tingling. Psychiatric: Denies depression, anxiety, difficulty sleeping, and memory loss. Endocrine: Denies heat or cold intolerance, and increased thirst or urination. Blood/lymph: Denies easy bruising, easy bruising, and swollen glands     Objective:     BP (!) 138/56 (BP Location: Right Arm)   Pulse 65   Temp 100 F (37.8 C) (Oral)   Resp 18   Ht '5\' 1"'$  (1.549 m)   Wt 103 kg   SpO2 93%   BMI 42.91 kg/m   Constitutional :  alert, cooperative, appears stated age, and no distress  Lymphatics/Throat:  no asymmetry, masses, or scars  Respiratory:  clear to auscultation bilaterally  Cardiovascular:  regular rate and rhythm  Gastrointestinal: Soft, no guarding, no tympany noted on exam.  Focal tenderness in right lower quadrant no tenderness in other quadrants with distraction .   Musculoskeletal: Steady movement  Skin: Cool and moist   Psychiatric: Normal affect, non-agitated, not confused       LABS:     Latest Ref Rng & Units 10/18/2022    3:04 PM 10/17/2022    8:50 PM 10/17/2022    4:10 PM  CMP  Glucose 70 - 99 mg/dL   169   BUN 8 - 23 mg/dL   14   Creatinine 0.44 - 1.00 mg/dL   0.89   Sodium 135 - 145 mmol/L   138   Potassium 3.5 - 5.1 mmol/L 3.0   3.0   Chloride 98 - 111 mmol/L   101   CO2 22 - 32 mmol/L   27   Calcium 8.9 - 10.3 mg/dL   9.6   Total Protein 6.5 - 8.1 g/dL  6.6    Total Bilirubin 0.3 - 1.2 mg/dL  0.8    Alkaline Phos 38 - 126 U/L  55    AST 15 - 41 U/L  27    ALT 0 - 44 U/L  20        Latest Ref Rng & Units 10/17/2022    4:10 PM 09/20/2020    8:03 AM 09/07/2020   10:42 AM  CBC  WBC 4.0 -  10.5 K/uL 12.1   5.9   Hemoglobin 12.0 - 15.0 g/dL 12.1  12.2  11.7   Hematocrit 36.0 - 46.0 % 36.8  36.0  34.0   Platelets 150 - 400 K/uL 189   191     RADS: CLINICAL DATA:  Right-sided back pain radiating to right flank and inguinal region, nausea   EXAM: CT ANGIOGRAPHY CHEST   CT ABDOMEN AND PELVIS WITH CONTRAST   TECHNIQUE: Multidetector CT imaging of the chest was performed using the standard protocol during bolus administration of intravenous contrast. Multiplanar CT image reconstructions and MIPs were obtained to evaluate the vascular anatomy. Multidetector CT imaging of the abdomen and pelvis was performed using the standard protocol during bolus administration of intravenous contrast.  RADIATION DOSE REDUCTION: This exam was performed according to the departmental dose-optimization program which includes automated exposure control, adjustment of the mA and/or kV according to patient size and/or use of iterative reconstruction technique.   CONTRAST:  140m OMNIPAQUE IOHEXOL 350 MG/ML SOLN   COMPARISON:  10/09/2022   FINDINGS: CTA CHEST FINDINGS   Cardiovascular: This is a technically adequate evaluation of the pulmonary vasculature. No filling defects or pulmonary emboli.   The heart is enlarged without pericardial effusion. No evidence of thoracic aortic aneurysm or dissection. Atherosclerosis of the aorta and coronary vasculature.   Mediastinum/Nodes: No enlarged mediastinal, hilar, or axillary lymph nodes. Thyroid gland, trachea, and esophagus demonstrate no significant findings. Small hiatal hernia.   Lungs/Pleura: Hypoventilatory changes are seen at the lung bases. Trace bilateral pleural effusions. No airspace disease or pneumothorax. Central airways are patent.   Musculoskeletal: No acute or destructive bony lesions. Reconstructed images demonstrate no additional findings.   Review of the MIP images confirms the above findings.   CT ABDOMEN and  PELVIS FINDINGS   Hepatobiliary: No focal liver abnormality is seen. No gallstones, gallbladder wall thickening, or biliary dilatation.   Pancreas: Diffuse fatty infiltration of the pancreas. There is some mild fat stranding along the ventral aspect of the pancreatic body consistent with acute pancreatitis. No fluid collection, pseudocyst, or abscess.   Spleen: Normal in size without focal abnormality.   Adrenals/Urinary Tract: Adrenal glands are unremarkable. Kidneys are normal, without renal calculi, focal lesion, or hydronephrosis. Bladder is unremarkable.   Stomach/Bowel: No bowel obstruction or ileus. Scattered colonic diverticulosis without diverticulitis. Normal appendix right lower quadrant.   A spiculated soft tissue mass identified along the medial aspect of the ascending colon, measuring 2.3 x 2.7 x 2.1 cm reference image 41/5. Findings are highly concerning for malignancy, including primary colonic malignancy, metastatic disease, or gastrointestinal stromal tumor. There is loss of normal fat plane between this mass and the serosal surface of the ascending colon. Further evaluation with PET CT is recommended.   Vascular/Lymphatic: Aortic atherosclerosis. No enlarged abdominal or pelvic lymph nodes.   Reproductive: Uterus and bilateral adnexa are unremarkable.   Other: No free fluid or free intraperitoneal gas. Small fat containing umbilical hernia.   Musculoskeletal: No acute or destructive bony lesions. Reconstructed images demonstrate no additional findings.   Review of the MIP images confirms the above findings.   IMPRESSION: Chest:   1. No evidence of pulmonary embolus. 2. Bibasilar hypoventilatory changes and trace bilateral effusions. No acute airspace disease. 3. Aortic Atherosclerosis (ICD10-I70.0). Coronary artery atherosclerosis   Abdomen/pelvis:   1. Spiculated soft tissue mass along the serosal surface medial aspect ascending colon,  measuring up to 2.7 cm. Differential diagnosis includes primary colonic malignancy, metastatic disease, or gastrointestinal stromal tumor. Further evaluation with PET-CT is recommended. 2. Mild peripancreatic fat stranding consistent with acute uncomplicated pancreatitis. Diffuse fatty infiltration of the pancreas. 3. Sigmoid diverticulosis without diverticulitis. 4. Small hiatal hernia. 5.  Aortic Atherosclerosis (ICD10-I70.0).     Electronically Signed   By: MRanda NgoM.D.   On: 10/17/2022 21:00 Assessment:   Colon mass noted on CT.  Plan:    Surgery will be on standby pending colonoscopy results.  Agree with plan with proceeding with laxatives to see if colon can be decompressed to alleviate some of the pain.  Serial abdominal exams in the meantime.   labs/images/medications/previous chart entries reviewed personally and relevant changes/updates noted above.

## 2022-10-18 NOTE — Hospital Course (Addendum)
Taken from H&P.  Dominique Moore is a 81 y.o. female with medical history significant for Breast cancer in 2000, HTN, paroxysmal A-fib on anticoagulation with Eliquis, class III obesity, HTN who presents to the ED with a 2-day history of abdominal pain that is worse with moving or taking of deep breath, associated with shortness of breath.   ED course and Data review: Originally afebrile but then spiked a temperature of 100.4 and became tachypneic to 24 with O2 sat 85% on room air rebounding on its own to 92%.  SBP in the 130s to 140s and pulse in the 70s.  Labs with WBC 12,000 and lactic acid 1.2.  Respiratory panel negative for COVID flu and RSV.  Lipase normal, LFTs pending.  BMP significant for potassium of 3 and urinalysis pending.  EKG, personally viewed and interpreted showing NSR at 69 with nonspecific ST-T wave changes. CTA chest and CT abdomen and pelvis with contrast significant for 2.1 cm serosal mass of colon and fat stranding around the pancreas as outlined below: IMPRESSION: Chest:   1. No evidence of pulmonary embolus. 2. Bibasilar hypoventilatory changes and trace bilateral effusions. No acute airspace disease. 3. Aortic Atherosclerosis (ICD10-I70.0). Coronary artery atherosclerosis   Abdomen/pelvis:   1. Spiculated soft tissue mass along the serosal surface medial aspect ascending colon, measuring up to 2.7 cm. Differential diagnosis includes primary colonic malignancy, metastatic disease, or gastrointestinal stromal tumor. Further evaluation with PET-CT is recommended. 2. Mild peripancreatic fat stranding consistent with acute uncomplicated pancreatitis. Diffuse fatty infiltration of the pancreas. 3. Sigmoid diverticulosis without diverticulitis. 4. Small hiatal hernia. 5.  Aortic Atherosclerosis (ICD10-I70.0).  She was given IV fluid and started on broad-spectrum antibiotics for concern of sepsis secondary to intra-abdominal infection. GI was consulted.  12/29:  Afebrile this morning, preliminary blood cultures negative and 12 hours.  Procalcitonin negative.  Significant abdominal pain and tenderness especially on right mid to lower quadrant. GI is planning colonoscopy on Sunday.  Also requested general surgery evaluation. Switching cefepime with ceftriaxone, will continue Flagyl for now.  12/30: Afebrile this morning, maximum temperature recorded at 101.6 over the past 24-hour.  Blood cultures remain negative.  Potassium has been normalized. GI started her on soft bowel regimen to see if that will help with relieving constipation and symptoms, colonoscopy on 12/31.  Currently on clear liquid.  No bowel movement yet. GI ordered KUB and magnesium citrate to see if that will help.  If there is no concern of bowel obstruction then they will proceed with bowel prep.

## 2022-10-19 ENCOUNTER — Inpatient Hospital Stay: Payer: Medicare Other

## 2022-10-19 DIAGNOSIS — R109 Unspecified abdominal pain: Secondary | ICD-10-CM | POA: Diagnosis not present

## 2022-10-19 DIAGNOSIS — R140 Abdominal distension (gaseous): Secondary | ICD-10-CM | POA: Diagnosis not present

## 2022-10-19 DIAGNOSIS — K6389 Other specified diseases of intestine: Secondary | ICD-10-CM | POA: Diagnosis not present

## 2022-10-19 LAB — CBC WITH DIFFERENTIAL/PLATELET
Abs Immature Granulocytes: 0.11 10*3/uL — ABNORMAL HIGH (ref 0.00–0.07)
Basophils Absolute: 0 10*3/uL (ref 0.0–0.1)
Basophils Relative: 0 %
Eosinophils Absolute: 0.1 10*3/uL (ref 0.0–0.5)
Eosinophils Relative: 0 %
HCT: 35.3 % — ABNORMAL LOW (ref 36.0–46.0)
Hemoglobin: 11.4 g/dL — ABNORMAL LOW (ref 12.0–15.0)
Immature Granulocytes: 1 %
Lymphocytes Relative: 4 %
Lymphs Abs: 0.6 10*3/uL — ABNORMAL LOW (ref 0.7–4.0)
MCH: 27.9 pg (ref 26.0–34.0)
MCHC: 32.3 g/dL (ref 30.0–36.0)
MCV: 86.3 fL (ref 80.0–100.0)
Monocytes Absolute: 1 10*3/uL (ref 0.1–1.0)
Monocytes Relative: 7 %
Neutro Abs: 13.6 10*3/uL — ABNORMAL HIGH (ref 1.7–7.7)
Neutrophils Relative %: 88 %
Platelets: 187 10*3/uL (ref 150–400)
RBC: 4.09 MIL/uL (ref 3.87–5.11)
RDW: 14.3 % (ref 11.5–15.5)
WBC: 15.4 10*3/uL — ABNORMAL HIGH (ref 4.0–10.5)
nRBC: 0 % (ref 0.0–0.2)

## 2022-10-19 LAB — BASIC METABOLIC PANEL
Anion gap: 6 (ref 5–15)
BUN: 21 mg/dL (ref 8–23)
CO2: 28 mmol/L (ref 22–32)
Calcium: 8.6 mg/dL — ABNORMAL LOW (ref 8.9–10.3)
Chloride: 107 mmol/L (ref 98–111)
Creatinine, Ser: 0.84 mg/dL (ref 0.44–1.00)
GFR, Estimated: >60 mL/min (ref >60)
Glucose, Bld: 164 mg/dL — ABNORMAL HIGH (ref 70–99)
Potassium: 4 mmol/L (ref 3.5–5.1)
Sodium: 141 mmol/L (ref 135–145)

## 2022-10-19 LAB — URINE CULTURE: Culture: NO GROWTH

## 2022-10-19 MED ORDER — BISACODYL 10 MG RE SUPP
10.0000 mg | Freq: Once | RECTAL | Status: AC
  Administered 2022-10-19: 10 mg via RECTAL
  Filled 2022-10-19: qty 1

## 2022-10-19 MED ORDER — MAGNESIUM CITRATE PO SOLN
1.0000 | Freq: Once | ORAL | Status: AC
  Administered 2022-10-19: 1 via ORAL
  Filled 2022-10-19: qty 296

## 2022-10-19 NOTE — Progress Notes (Signed)
Progress Note   Patient: Dominique Moore JIR:[id number] DOB: November 08, 1940 DOA: 10/17/2022     2 DOS: the patient was seen and examined on 10/19/2022   Brief hospital course: Taken from H&P.  BRYSTOL WASILEWSKI is a 81 y.o. female with medical history significant for Breast cancer in 2000, HTN, paroxysmal A-fib on anticoagulation with Eliquis, class III obesity, HTN who presents to the ED with a 2-day history of abdominal pain that is worse with moving or taking of deep breath, associated with shortness of breath.   ED course and Data review: Originally afebrile but then spiked a temperature of 100.4 and became tachypneic to 24 with O2 sat 85% on room air rebounding on its own to 92%.  SBP in the 130s to 140s and pulse in the 70s.  Labs with WBC 12,000 and lactic acid 1.2.  Respiratory panel negative for COVID flu and RSV.  Lipase normal, LFTs pending.  BMP significant for potassium of 3 and urinalysis pending.  EKG, personally viewed and interpreted showing NSR at 69 with nonspecific ST-T wave changes. CTA chest and CT abdomen and pelvis with contrast significant for 2.1 cm serosal mass of colon and fat stranding around the pancreas as outlined below: IMPRESSION: Chest:   1. No evidence of pulmonary embolus. 2. Bibasilar hypoventilatory changes and trace bilateral effusions. No acute airspace disease. 3. Aortic Atherosclerosis (ICD10-I70.0). Coronary artery atherosclerosis   Abdomen/pelvis:   1. Spiculated soft tissue mass along the serosal surface medial aspect ascending colon, measuring up to 2.7 cm. Differential diagnosis includes primary colonic malignancy, metastatic disease, or gastrointestinal stromal tumor. Further evaluation with PET-CT is recommended. 2. Mild peripancreatic fat stranding consistent with acute uncomplicated pancreatitis. Diffuse fatty infiltration of the pancreas. 3. Sigmoid diverticulosis without diverticulitis. 4. Small hiatal hernia. 5.  Aortic Atherosclerosis  (ICD10-I70.0).  She was given IV fluid and started on broad-spectrum antibiotics for concern of sepsis secondary to intra-abdominal infection. GI was consulted.  12/29: Afebrile this morning, preliminary blood cultures negative and 12 hours.  Procalcitonin negative.  Significant abdominal pain and tenderness especially on right mid to lower quadrant. GI is planning colonoscopy on Sunday.  Also requested general surgery evaluation. Switching cefepime with ceftriaxone, will continue Flagyl for now.  12/30: Afebrile this morning, maximum temperature recorded at 101.6 over the past 24-hour.  Blood cultures remain negative.  Potassium has been normalized. GI started her on soft bowel regimen to see if that will help with relieving constipation and symptoms, colonoscopy on 12/31.  Currently on clear liquid.  No bowel movement yet. GI ordered KUB and magnesium citrate to see if that will help.  If there is no concern of bowel obstruction then they will proceed with bowel prep.     Assessment and Plan: * Right-sided abdominal pain of unknown etiology Patient presents with a day of abdominal pain with CT showing a 2.7 cm spiculated soft tissue mass on the serosal surface of the ascending colon with differential including primary colonic malignancy, metastatic disease or GIST Peripancreatic fat stranding seen but lipase and hepatic function panel normal. Procalcitonin and preliminary blood cultures negative. 1 febrile episode. GI was consulted-patient was given trial of laxative to see if some decompression of bowel help with her symptoms.  Also ordered KUB as she did not had any bowel movement with MiraLAX. -KUB ordered-if no concern of an ileus then she will proceed with bowel prep -Trial of magnesium citrate now -Continue with supportive care  Colonic mass Abdominal pain Peripancreatic fat stranding  Lipase and hepatic function panel within normal limit. GI was consulted.  Colonoscopy most  likely on Sunday. Significant tenderness and abdominal distention-General surgery evaluation was also requested, according to them there is no need for emergent surgery and they will be available if needed -Continue with supportive care  Sepsis [facility]) Patient has fever, tachypnea, leukocytosis Suspect SIRS but will treat empirically for sepsis of intra-abdominal source Procalcitonin and preliminary blood cultures negative.  Initially received cefepime and it was switched with ceftriaxone Continue ceftriaxone Continue Flagyl for now  Paroxysmal A-fib (HCC) Continue amiodarone Will hold Eliquis in case of procedure  History of breast cancer No acute issues suspected  Depression Continue escitalopram  Hypertension Continue home atenolol and losartan  Obesity, Class III, BMI 40-49.9 (morbid obesity) (Rule) Estimated body mass index is 42.91 kg/m as calculated from the following:   Height as of this encounter: '5\' 1"'$  (1.549 m).   Weight as of this encounter: 103 kg.   Potential complicating factor to overall prognosis and care   Subjective: Patient continued to have significant right lower quadrant pain and tenderness, passing flatus but no bowel movement yet, no nausea or vomiting.  Physical Exam: Vitals:   10/18/22 1914 10/19/22 0428 10/19/22 0742 10/19/22 1447  BP:  (!) 128/59 130/67 (!) 123/59  Pulse:  66 65 61  Resp:  '20 17 18  '$ Temp: 100 F (37.8 C) 99 F (37.2 C) 98.4 F (36.9 C) 99.5 F (37.5 C)  TempSrc: Oral Oral    SpO2:  95% 95% 93%  Weight:      Height:       General.  Morbidly obese lady, in no acute distress. Pulmonary.  Lungs clear bilaterally, normal respiratory effort. CV.  Regular rate and rhythm, no JVD, rub or murmur. Abdomen.  Distended with right lower quadrant tenderness, hypoactive bowel sounds on right CNS.  Alert and oriented .  No focal neurologic deficit. Extremities.  No edema, no cyanosis, pulses intact and symmetrical. Psychiatry.   Judgment and insight appears normal.   Data Reviewed: Prior data reviewed  Family Communication: Discussed with husband at bedside  Disposition: Status is: Inpatient Remains inpatient appropriate because: Severity of illness  Planned Discharge Destination: Home  DVT prophylaxis.  SCDs Time spent: 44 minutes  This record has been created using Systems analyst. Errors have been sought and corrected,but may not always be located. Such creation errors do not reflect on the standard of care.   Author: Lorella Nimrod, MD 10/19/2022 4:11 PM  For on call review [email or web address].

## 2022-10-19 NOTE — Assessment & Plan Note (Signed)
Continue home atenolol and losartan

## 2022-10-19 NOTE — Assessment & Plan Note (Addendum)
Abdominal pain Peripancreatic fat stranding Lipase and hepatic function panel within normal limit. GI was consulted. . Significant tenderness and abdominal distention-General surgery evaluation was also requested, according to them there is no need for emergent surgery and they will be available if needed -Colonoscopy rule out any colonic mass -Continue with supportive care

## 2022-10-19 NOTE — Progress Notes (Signed)
Dominique Darby, MD [address]  [place], [place] [address]  Main: [phone]  Fax: [phone] Pager: [phone]   Subjective: Patient reports less pain but feels more bloated and gassy.  She is passing gas.  She has been taking MiraLAX 34 g twice daily.  Did not have a bowel movement yet.  She states that magnesium citrate helps her with constipation.   Objective: Vital signs in last 24 hours: Vitals:   10/18/22 1914 10/19/22 0428 10/19/22 0742 10/19/22 1447  BP:  (!) 128/59 130/67 (!) 123/59  Pulse:  66 65 61  Resp:  '20 17 18  '$ Temp: 100 F (37.8 C) 99 F (37.2 C) 98.4 F (36.9 C) 99.5 F (37.5 C)  TempSrc: Oral Oral    SpO2:  95% 95% 93%  Weight:      Height:       Weight change:   Intake/Output Summary (Last 24 hours) at 10/19/2022 1531 Last data filed at 10/19/2022 1502 Gross per 24 hour  Intake 499.35 ml  Output 0 ml  Net 499.35 ml     Exam: Heart:: Regular rate and rhythm, S1S2 present, or without murmur or extra heart sounds Lungs: normal and clear to auscultation Abdomen: Diffusely distended, mild tenderness, tympanic to percussion, hypoactive bowel sounds   Lab Results:    Latest Ref Rng & Units 10/17/2022    4:10 PM 09/20/2020    8:03 AM 09/07/2020   10:42 AM  CBC  WBC 4.0 - 10.5 K/uL 12.1   5.9   Hemoglobin 12.0 - 15.0 g/dL 12.1  12.2  11.7   Hematocrit 36.0 - 46.0 % 36.8  36.0  34.0   Platelets 150 - 400 K/uL 189   191       Latest Ref Rng & Units 10/19/2022    4:13 AM 10/18/2022    3:04 PM 10/17/2022    8:50 PM  CMP  Glucose 70 - 99 mg/dL 164     BUN 8 - 23 mg/dL 21     Creatinine 0.44 - 1.00 mg/dL 0.84     Sodium 135 - 145 mmol/L 141     Potassium 3.5 - 5.1 mmol/L 4.0  3.0    Chloride 98 - 111 mmol/L 107     CO2 22 - 32 mmol/L 28     Calcium 8.9 - 10.3 mg/dL 8.6     Total Protein 6.5 - 8.1 g/dL   6.6   Total Bilirubin 0.3 - 1.2 mg/dL   0.8   Alkaline Phos 38 - 126 U/L   55   AST 15 - 41 U/L   27   ALT  0 - 44 U/L   20     Micro Results: Recent Results (from the past 240 hour(s))  Resp panel by RT-PCR (RSV, Flu A&B, Covid) Anterior Nasal Swab     Status: None   Collection Time: 10/17/22  7:10 PM   Specimen: Anterior Nasal Swab  Result Value Ref Range Status   SARS Coronavirus 2 by RT PCR NEGATIVE NEGATIVE Final    Comment: (NOTE) SARS-CoV-2 target nucleic acids are NOT DETECTED.  The SARS-CoV-2 RNA is generally detectable in upper respiratory specimens during the acute phase of infection. The lowest concentration of SARS-CoV-2 viral copies this assay can detect is 138 copies/mL. A negative result does not preclude SARS-Cov-2 infection and should not be used as the sole basis for treatment or other patient management decisions. A negative result may occur  with  improper specimen collection/handling, submission of specimen other than nasopharyngeal swab, presence of viral mutation(s) within the areas targeted by this assay, and inadequate number of viral copies(<138 copies/mL). A negative result must be combined with clinical observations, patient history, and epidemiological information. The expected result is Negative.  Fact Sheet for Patients:  EntrepreneurPulse.com.au  Fact Sheet for Healthcare Providers:  IncredibleEmployment.be  This test is no t yet approved or cleared by the [place] FDA and  has been authorized for detection and/or diagnosis of SARS-CoV-2 by FDA under an Emergency Use Authorization (EUA). This EUA will remain  in effect (meaning this test can be used) for the duration of the COVID-19 declaration under Section 564(b)(1) of the Act, 21 U.S.C.section 360bbb-3(b)(1), unless the authorization is terminated  or revoked sooner.       Influenza A by PCR NEGATIVE NEGATIVE Final   Influenza B by PCR NEGATIVE NEGATIVE Final    Comment: (NOTE) The Xpert Xpress SARS-CoV-2/FLU/RSV plus assay is intended as an aid in the  diagnosis of influenza from Nasopharyngeal swab specimens and should not be used as a sole basis for treatment. Nasal washings and aspirates are unacceptable for Xpert Xpress SARS-CoV-2/FLU/RSV testing.  Fact Sheet for Patients: EntrepreneurPulse.com.au  Fact Sheet for Healthcare Providers: IncredibleEmployment.be  This test is not yet approved or cleared by the [place] FDA and has been authorized for detection and/or diagnosis of SARS-CoV-2 by FDA under an Emergency Use Authorization (EUA). This EUA will remain in effect (meaning this test can be used) for the duration of the COVID-19 declaration under Section 564(b)(1) of the Act, 21 U.S.C. section 360bbb-3(b)(1), unless the authorization is terminated or revoked.     Resp Syncytial Virus by PCR NEGATIVE NEGATIVE Final    Comment: (NOTE) Fact Sheet for Patients: EntrepreneurPulse.com.au  Fact Sheet for Healthcare Providers: IncredibleEmployment.be  This test is not yet approved or cleared by the [place] FDA and has been authorized for detection and/or diagnosis of SARS-CoV-2 by FDA under an Emergency Use Authorization (EUA). This EUA will remain in effect (meaning this test can be used) for the duration of the COVID-19 declaration under Section 564(b)(1) of the Act, 21 U.S.C. section 360bbb-3(b)(1), unless the authorization is terminated or revoked.  Performed at [facility], [address] [place], [place] [address]   Blood Culture (routine x 2)     Status: None (Preliminary result)   Collection Time: 10/17/22  9:29 PM   Specimen: BLOOD  Result Value Ref Range Status   Specimen Description BLOOD BLOOD LEFT FOREARM  Final   Special Requests   Final    BOTTLES DRAWN AEROBIC AND ANAEROBIC Blood Culture results may not be optimal due to an inadequate volume of blood received in culture bottles   Culture   Final    NO GROWTH 2  DAYS Performed at [facility], [address], [place], [place] [address]    Report Status PENDING  Incomplete  Blood Culture (routine x 2)     Status: None (Preliminary result)   Collection Time: 10/17/22  9:29 PM   Specimen: BLOOD  Result Value Ref Range Status   Specimen Description BLOOD BLOOD RIGHT FOREARM  Final   Special Requests   Final    BOTTLES DRAWN AEROBIC AND ANAEROBIC Blood Culture adequate volume   Culture   Final    NO GROWTH 2 DAYS Performed at [facility], [address], [place], [place] [address]    Report Status PENDING  Incomplete  Culture, blood (Routine X 2) w Reflex to ID Panel     Status: None (Preliminary result)   Collection Time: 10/18/22  3:04 PM   Specimen: BLOOD  Result Value Ref Range Status   Specimen Description BLOOD BLOOD RIGHT HAND  Final   Special Requests   Final    BOTTLES DRAWN AEROBIC AND ANAEROBIC Blood Culture adequate volume   Culture   Final    NO GROWTH < 24 HOURS Performed at [facility], [address] [place], [place] [address]    Report Status PENDING  Incomplete  Culture, blood (Routine X 2) w Reflex to ID Panel     Status: None (Preliminary result)   Collection Time: 10/18/22  3:06 PM   Specimen: BLOOD  Result Value Ref Range Status   Specimen Description BLOOD BLOOD LEFT HAND  Final   Special Requests   Final    BOTTLES DRAWN AEROBIC AND ANAEROBIC Blood Culture adequate volume   Culture   Final    NO GROWTH < 24 HOURS Performed at [facility], [address] [place], [place] [address]    Report Status PENDING  Incomplete   Studies/Results: CT Angio Chest PE W/Cm &/Or Wo Cm  Result Date: 10/17/2022 CLINICAL DATA:  Right-sided back pain radiating to right flank and inguinal region, nausea EXAM: CT ANGIOGRAPHY CHEST CT ABDOMEN AND PELVIS WITH CONTRAST TECHNIQUE: Multidetector CT imaging of the chest was performed using the standard protocol during bolus administration of  intravenous contrast. Multiplanar CT image reconstructions and MIPs were obtained to evaluate the vascular anatomy. Multidetector CT imaging of the abdomen and pelvis was performed using the standard protocol during bolus administration of intravenous contrast. RADIATION DOSE REDUCTION: This exam was performed according to the departmental dose-optimization program which includes automated exposure control, adjustment of the mA and/or kV according to patient size and/or use of iterative reconstruction technique. CONTRAST:  185m OMNIPAQUE IOHEXOL 350 MG/ML SOLN COMPARISON:  10/09/2022 FINDINGS: CTA CHEST FINDINGS Cardiovascular: This is a technically adequate evaluation of the pulmonary vasculature. No filling defects or pulmonary emboli. The heart is enlarged without pericardial effusion. No evidence of thoracic aortic aneurysm or dissection. Atherosclerosis of the aorta and coronary vasculature. Mediastinum/Nodes: No enlarged mediastinal, hilar, or axillary lymph nodes. Thyroid gland, trachea, and esophagus demonstrate no significant findings. Small hiatal hernia. Lungs/Pleura: Hypoventilatory changes are seen at the lung bases. Trace bilateral pleural effusions. No airspace disease or pneumothorax. Central airways are patent. Musculoskeletal: No acute or destructive bony lesions. Reconstructed images demonstrate no additional findings. Review of the MIP images confirms the above findings. CT ABDOMEN and PELVIS FINDINGS Hepatobiliary: No focal liver abnormality is seen. No gallstones, gallbladder wall thickening, or biliary dilatation. Pancreas: Diffuse fatty infiltration of the pancreas. There is some mild fat stranding along the ventral aspect of the pancreatic body consistent with acute pancreatitis. No fluid collection, pseudocyst, or abscess. Spleen: Normal in size without focal abnormality. Adrenals/Urinary Tract: Adrenal glands are unremarkable. Kidneys are normal, without renal calculi, focal lesion, or  hydronephrosis. Bladder is unremarkable. Stomach/Bowel: No bowel obstruction or ileus. Scattered colonic diverticulosis without diverticulitis. Normal appendix right lower quadrant. A spiculated soft tissue mass identified along the medial aspect of the ascending colon, measuring 2.3 x 2.7 x 2.1 cm reference image 41/5. Findings are highly concerning for malignancy, including primary colonic malignancy, metastatic disease, or gastrointestinal stromal tumor. There is loss of normal fat plane between this mass and the serosal surface of the ascending colon. Further evaluation  with PET CT is recommended. Vascular/Lymphatic: Aortic atherosclerosis. No enlarged abdominal or pelvic lymph nodes. Reproductive: Uterus and bilateral adnexa are unremarkable. Other: No free fluid or free intraperitoneal gas. Small fat containing umbilical hernia. Musculoskeletal: No acute or destructive bony lesions. Reconstructed images demonstrate no additional findings. Review of the MIP images confirms the above findings. IMPRESSION: Chest: 1. No evidence of pulmonary embolus. 2. Bibasilar hypoventilatory changes and trace bilateral effusions. No acute airspace disease. 3. Aortic Atherosclerosis (ICD10-I70.0). Coronary artery atherosclerosis Abdomen/pelvis: 1. Spiculated soft tissue mass along the serosal surface medial aspect ascending colon, measuring up to 2.7 cm. Differential diagnosis includes primary colonic malignancy, metastatic disease, or gastrointestinal stromal tumor. Further evaluation with PET-CT is recommended. 2. Mild peripancreatic fat stranding consistent with acute uncomplicated pancreatitis. Diffuse fatty infiltration of the pancreas. 3. Sigmoid diverticulosis without diverticulitis. 4. Small hiatal hernia. 5.  Aortic Atherosclerosis (ICD10-I70.0). Electronically Signed   By: Randa Ngo M.D.   On: 10/17/2022 21:00   CT Abdomen Pelvis W Contrast  Result Date: 10/17/2022 CLINICAL DATA:  Right-sided back pain  radiating to right flank and inguinal region, nausea EXAM: CT ANGIOGRAPHY CHEST CT ABDOMEN AND PELVIS WITH CONTRAST TECHNIQUE: Multidetector CT imaging of the chest was performed using the standard protocol during bolus administration of intravenous contrast. Multiplanar CT image reconstructions and MIPs were obtained to evaluate the vascular anatomy. Multidetector CT imaging of the abdomen and pelvis was performed using the standard protocol during bolus administration of intravenous contrast. RADIATION DOSE REDUCTION: This exam was performed according to the departmental dose-optimization program which includes automated exposure control, adjustment of the mA and/or kV according to patient size and/or use of iterative reconstruction technique. CONTRAST:  176m OMNIPAQUE IOHEXOL 350 MG/ML SOLN COMPARISON:  10/09/2022 FINDINGS: CTA CHEST FINDINGS Cardiovascular: This is a technically adequate evaluation of the pulmonary vasculature. No filling defects or pulmonary emboli. The heart is enlarged without pericardial effusion. No evidence of thoracic aortic aneurysm or dissection. Atherosclerosis of the aorta and coronary vasculature. Mediastinum/Nodes: No enlarged mediastinal, hilar, or axillary lymph nodes. Thyroid gland, trachea, and esophagus demonstrate no significant findings. Small hiatal hernia. Lungs/Pleura: Hypoventilatory changes are seen at the lung bases. Trace bilateral pleural effusions. No airspace disease or pneumothorax. Central airways are patent. Musculoskeletal: No acute or destructive bony lesions. Reconstructed images demonstrate no additional findings. Review of the MIP images confirms the above findings. CT ABDOMEN and PELVIS FINDINGS Hepatobiliary: No focal liver abnormality is seen. No gallstones, gallbladder wall thickening, or biliary dilatation. Pancreas: Diffuse fatty infiltration of the pancreas. There is some mild fat stranding along the ventral aspect of the pancreatic body consistent  with acute pancreatitis. No fluid collection, pseudocyst, or abscess. Spleen: Normal in size without focal abnormality. Adrenals/Urinary Tract: Adrenal glands are unremarkable. Kidneys are normal, without renal calculi, focal lesion, or hydronephrosis. Bladder is unremarkable. Stomach/Bowel: No bowel obstruction or ileus. Scattered colonic diverticulosis without diverticulitis. Normal appendix right lower quadrant. A spiculated soft tissue mass identified along the medial aspect of the ascending colon, measuring 2.3 x 2.7 x 2.1 cm reference image 41/5. Findings are highly concerning for malignancy, including primary colonic malignancy, metastatic disease, or gastrointestinal stromal tumor. There is loss of normal fat plane between this mass and the serosal surface of the ascending colon. Further evaluation with PET CT is recommended. Vascular/Lymphatic: Aortic atherosclerosis. No enlarged abdominal or pelvic lymph nodes. Reproductive: Uterus and bilateral adnexa are unremarkable. Other: No free fluid or free intraperitoneal gas. Small fat containing umbilical hernia. Musculoskeletal: No acute or destructive  bony lesions. Reconstructed images demonstrate no additional findings. Review of the MIP images confirms the above findings. IMPRESSION: Chest: 1. No evidence of pulmonary embolus. 2. Bibasilar hypoventilatory changes and trace bilateral effusions. No acute airspace disease. 3. Aortic Atherosclerosis (ICD10-I70.0). Coronary artery atherosclerosis Abdomen/pelvis: 1. Spiculated soft tissue mass along the serosal surface medial aspect ascending colon, measuring up to 2.7 cm. Differential diagnosis includes primary colonic malignancy, metastatic disease, or gastrointestinal stromal tumor. Further evaluation with PET-CT is recommended. 2. Mild peripancreatic fat stranding consistent with acute uncomplicated pancreatitis. Diffuse fatty infiltration of the pancreas. 3. Sigmoid diverticulosis without diverticulitis. 4.  Small hiatal hernia. 5.  Aortic Atherosclerosis (ICD10-I70.0). Electronically Signed   By: Randa Ngo M.D.   On: 10/17/2022 21:00   CT Renal Stone Study  Result Date: 10/17/2022 CLINICAL DATA:  Abdominal pain, flank pain EXAM: CT ABDOMEN AND PELVIS WITHOUT CONTRAST TECHNIQUE: Multidetector CT imaging of the abdomen and pelvis was performed following the standard protocol without IV contrast. RADIATION DOSE REDUCTION: This exam was performed according to the departmental dose-optimization program which includes automated exposure control, adjustment of the mA and/or kV according to patient size and/or use of iterative reconstruction technique. COMPARISON:  None Available. FINDINGS: Lower chest: There are small linear patchy infiltrates in the lower lung fields, more so on the right side. Heart is enlarged in size. Minimal right pleural effusion is seen. Hepatobiliary: No focal abnormalities are seen. Anterior bulging is seen in the left lobe of liver, possibly within ventral hernia. There is no dilation of bile ducts. Gallbladder is unremarkable. Pancreas: There is fatty infiltration in the pancreas, more so in the head. There is minimal stranding in the fat planes adjacent to head of the pancreas. There are no loculated fluid collections in or around the pancreas. Spleen: Unremarkable. Adrenals/Urinary Tract: Adrenals are unremarkable. There is no hydronephrosis. There are no renal or ureteral stones. Urinary bladder is not distended. Stomach/Bowel: Small hiatal hernia is seen. Stomach is unremarkable. Small bowel loops are not dilated. Appendix is difficult to visualize. In image 62 of series 5, there is a small caliber tubular structure inseparable from tip of cecum, possibly normal appendix. There is no focal pericecal inflammation. Multiple diverticula are seen in colon without focal acute diverticulitis. Vascular/Lymphatic: Scattered arterial calcifications are seen. In image 33 of series 2, there is  2.5 x 2.3 cm nodular density adjacent to the medial margin of ascending colon. There are few subcentimeter nodes in retroperitoneum. Reproductive: Unremarkable. Other: There is no ascites or pneumoperitoneum. Umbilical hernia containing fat is seen. Musculoskeletal: Degenerative changes are noted in lumbar spine. IMPRESSION: There is no evidence of intestinal obstruction or pneumoperitoneum. There is no hydronephrosis. Appendix is not dilated. There is 2.5 cm smooth marginated soft tissue density lesion adjacent to the medial margin of ascending colon. Possibility of metastatic lymphadenopathy is not excluded. Short-term follow-up CT abdomen and pelvis in 3 months along with PET-CT if warranted should be considered. If the patient has any known primary malignancy, immediate further evaluation with PET-CT may be considered. There is minimal stranding in fat planes adjacent to the head of the pancreas which may be a normal variation or suggest early changes of acute pancreatitis. Please correlate with laboratory findings. There is no loculated fluid collection in or around the pancreas. Small hiatal hernia. Increased markings in the lower lung fields, more so on the right side may suggest scarring or atelectasis/pneumonia. Minimal right pleural effusion. Diverticulosis of colon without signs of focal diverticulitis. Other findings as described  in the body of the report. Electronically Signed   By: Elmer Picker M.D.   On: 10/17/2022 16:49   Medications: I have reviewed the patient's current medications. Prior to Admission:  Medications Prior to Admission  Medication Sig Dispense Refill Last Dose   amiodarone (PACERONE) 200 MG tablet Take 200 mg by mouth 2 (two) times daily.   Past Week at Unknown   amLODipine (NORVASC) 10 MG tablet Take 10 mg by mouth daily.    Past Week at Unknown   apixaban (ELIQUIS) 5 MG TABS tablet Take 5 mg by mouth 2 (two) times daily.   Past Week at Unknown   atenolol (TENORMIN)  25 MG tablet Take 25 mg by mouth daily.    Past Week at Unknown   citalopram (CELEXA) 20 MG tablet Take 20 mg by mouth daily.   Past Week at Unknown   clotrimazole (LOTRIMIN) 1 % cream Apply 1 application topically as needed (rash on hands).    Unknown at PRN   doxazosin (CARDURA) 4 MG tablet Take 4 mg by mouth daily.   Past Week at Unknown   furosemide (LASIX) 20 MG tablet Take 20 mg by mouth daily.   Past Week at Unknown   hydrochlorothiazide (HYDRODIURIL) 25 MG tablet Take 25 mg by mouth daily.   Past Week at Unknown   losartan (COZAAR) 100 MG tablet Take 100 mg by mouth daily.   Past Week at Unknown   potassium chloride SA (KLOR-CON M) 20 MEQ tablet Take 20 mEq by mouth daily.   Past Week at Unknown   pravastatin (PRAVACHOL) 20 MG tablet Take 20 mg by mouth at bedtime.   Past Week at Unknown   Scheduled Meds:  atenolol  25 mg Oral Daily   bisacodyl  10 mg Rectal Once   losartan  100 mg Oral Daily   magnesium citrate  1 Bottle Oral Once    morphine injection  4 mg Intravenous Once   polyethylene glycol  34 g Oral BID   pravastatin  20 mg Oral QHS   Continuous Infusions:  sodium chloride Stopped (10/19/22 0039)   cefTRIAXone (ROCEPHIN)  IV Stopped (10/19/22 1446)   metronidazole Stopped (10/19/22 0956)   PRN Meds:.sodium chloride, acetaminophen **OR** acetaminophen, morphine injection, ondansetron **OR** ondansetron (ZOFRAN) IV, mouth rinse   Assessment: Principal Problem:   Right-sided abdominal pain of unknown etiology Active Problems:   Obesity, Class III, BMI 40-49.9 (morbid obesity) ([place])   Sepsis ([place])   Hypertension   Depression   Colonic mass   History of breast cancer   Paroxysmal A-fib ([place])  Dominique Moore is a 81 y.o. female with history of morbid obesity, hypertension, hyperlipidemia, breast cancer, paroxysmal A-fib on Eliquis, adenomatous polyps of ascending colon, chronic constipation is admitted with right-sided abdominal pain, abdominal distention with  shortness of breath.  CT PE protocol negative.  Found to have mild sepsis, started on empiric antibiotics.  CT abdomen pelvis revealed possible ascending colon mass and peripancreatic stranding.  Serum lipase levels were normal.  I personally reviewed CT images and she does have large amount of stool in her right colon   Plan: Right-sided abdominal pain with generalized abdominal distention CT revealed possible ascending colon mass Appreciate general surgery input, did not recommend urgent surgical intervention Worsening of abdominal distention but patient is passing gas Obtain KUB today Trial of magnesium citrate x 1 dose ordered Dulcolax suppository x 1 dose ordered Okay to continue clear liquid diet only If x-ray KUB  does not reveal any ileus, continue current bowel regimen Advised patient to be out of bed to chair Monitor electrolytes closely, maintain potassium above 4, magnesium above 2 and phosphorus above 3 Patient is on Eliquis for history of A-fib, she states that at her last dose was about 2 to 3 days prior to admission Timing of colonoscopy to be determined when patient's symptoms improve and start moving her bowels  GI will follow along with you   LOS: 2 days   Dominique Moore 10/19/2022, 3:31 PM

## 2022-10-19 NOTE — Assessment & Plan Note (Addendum)
Patient presents with a day of abdominal pain with CT showing a 2.7 cm spiculated soft tissue mass on the serosal surface of the ascending colon with differential including primary colonic malignancy, metastatic disease or GIST Peripancreatic fat stranding seen but lipase and hepatic function panel normal. Procalcitonin and preliminary blood cultures negative. 1 febrile episode. GI was consulted-patient was given trial of laxative to see if some decompression of bowel help with her symptoms.  Started getting bowel movements -Colonoscopy tomorrow -Continue with supportive care

## 2022-10-19 NOTE — Assessment & Plan Note (Signed)
Patient has fever, tachypnea, leukocytosis Suspect SIRS but will treat empirically for sepsis of intra-abdominal source Procalcitonin and preliminary blood cultures negative.  Initially received cefepime and it was switched with ceftriaxone Continue ceftriaxone Continue Flagyl for now

## 2022-10-20 DIAGNOSIS — K6389 Other specified diseases of intestine: Secondary | ICD-10-CM | POA: Diagnosis not present

## 2022-10-20 DIAGNOSIS — R140 Abdominal distension (gaseous): Secondary | ICD-10-CM | POA: Diagnosis not present

## 2022-10-20 DIAGNOSIS — R109 Unspecified abdominal pain: Secondary | ICD-10-CM | POA: Diagnosis not present

## 2022-10-20 MED ORDER — PEG 3350-KCL-NA BICARB-NACL 420 G PO SOLR
4000.0000 mL | Freq: Once | ORAL | Status: AC
  Administered 2022-10-20: 4000 mL via ORAL
  Filled 2022-10-20: qty 4000

## 2022-10-20 MED ORDER — SODIUM CHLORIDE 0.9 % IV SOLN: INTRAVENOUS | Status: DC

## 2022-10-20 NOTE — Progress Notes (Signed)
Dominique Darby, MD [address]  [place]  [place], [place] [address]  Main: [phone]  Fax: [phone] Pager: [phone]   Subjective: Patient reports feeling significantly better today.  She has been having large bowel movements several times since yesterday evening.  Her stools are liquid brown.  She also reports feeling less distended and less gassy.  Pain is also better.   Objective: Vital signs in last 24 hours: Vitals:   10/19/22 1447 10/19/22 1944 10/20/22 0457 10/20/22 0921  BP: (!) 123/59 (!) 130/52 (!) 135/58 (!) 139/90  Pulse: 61 68 68 67  Resp: '18 20 20 18  '$ Temp: 99.5 F (37.5 C) 99.9 F (37.7 C) 99.1 F (37.3 C) 99.5 F (37.5 C)  TempSrc:  Oral Oral Oral  SpO2: 93% 95% 93% 93%  Weight:      Height:       Weight change:   Intake/Output Summary (Last 24 hours) at 10/20/2022 1253 Last data filed at 10/20/2022 1100 Gross per 24 hour  Intake 1459.35 ml  Output 0 ml  Net 1459.35 ml     Exam: Heart:: Regular rate and rhythm, S1S2 present, or without murmur or extra heart sounds Lungs: normal and clear to auscultation Abdomen: Diffusely distended, not tense, mild tenderness, tympanic to percussion   Lab Results:    Latest Ref Rng & Units 10/19/2022    4:01 PM 10/17/2022    4:10 PM 09/20/2020    8:03 AM  CBC  WBC 4.0 - 10.5 K/uL 15.4  12.1    Hemoglobin 12.0 - 15.0 g/dL 11.4  12.1  12.2   Hematocrit 36.0 - 46.0 % 35.3  36.8  36.0   Platelets 150 - 400 K/uL 187  189        Latest Ref Rng & Units 10/19/2022    4:13 AM 10/18/2022    3:04 PM 10/17/2022    8:50 PM  CMP  Glucose 70 - 99 mg/dL 164     BUN 8 - 23 mg/dL 21     Creatinine 0.44 - 1.00 mg/dL 0.84     Sodium 135 - 145 mmol/L 141     Potassium 3.5 - 5.1 mmol/L 4.0  3.0    Chloride 98 - 111 mmol/L 107     CO2 22 - 32 mmol/L 28     Calcium 8.9 - 10.3 mg/dL 8.6     Total Protein 6.5 - 8.1 g/dL   6.6   Total Bilirubin 0.3 - 1.2 mg/dL   0.8   Alkaline Phos 38 - 126 U/L    55   AST 15 - 41 U/L   27   ALT 0 - 44 U/L   20     Micro Results: Recent Results (from the past 240 hour(s))  Urine Culture     Status: None   Collection Time: 10/17/22  3:30 PM   Specimen: In/Out Cath Urine  Result Value Ref Range Status   Specimen Description   Final    IN/OUT CATH URINE Performed at [facility], [address] [place], [place] [address]    Special Requests   Final    NONE Performed at [facility], [address] [place], [place] [address]    Culture   Final    NO GROWTH Performed at [facility], [address] [place], [place] [address]    Report Status 10/19/2022 FINAL  Final  Resp panel by RT-PCR (RSV, Flu A&B, Covid) Anterior  Nasal Swab     Status: None   Collection Time: 10/17/22  7:10 PM   Specimen: Anterior Nasal Swab  Result Value Ref Range Status   SARS Coronavirus 2 by RT PCR NEGATIVE NEGATIVE Final    Comment: (NOTE) SARS-CoV-2 target nucleic acids are NOT DETECTED.  The SARS-CoV-2 RNA is generally detectable in upper respiratory specimens during the acute phase of infection. The lowest concentration of SARS-CoV-2 viral copies this assay can detect is 138 copies/mL. A negative result does not preclude SARS-Cov-2 infection and should not be used as the sole basis for treatment or other patient management decisions. A negative result may occur with  improper specimen collection/handling, submission of specimen other than nasopharyngeal swab, presence of viral mutation(s) within the areas targeted by this assay, and inadequate number of viral copies(<138 copies/mL). A negative result must be combined with clinical observations, patient history, and epidemiological information. The expected result is Negative.  Fact Sheet for Patients:  EntrepreneurPulse.com.au  Fact Sheet for Healthcare Providers:  IncredibleEmployment.be  This test is no t yet approved or cleared by the  [place] FDA and  has been authorized for detection and/or diagnosis of SARS-CoV-2 by FDA under an Emergency Use Authorization (EUA). This EUA will remain  in effect (meaning this test can be used) for the duration of the COVID-19 declaration under Section 564(b)(1) of the Act, 21 U.S.C.section 360bbb-3(b)(1), unless the authorization is terminated  or revoked sooner.       Influenza A by PCR NEGATIVE NEGATIVE Final   Influenza B by PCR NEGATIVE NEGATIVE Final    Comment: (NOTE) The Xpert Xpress SARS-CoV-2/FLU/RSV plus assay is intended as an aid in the diagnosis of influenza from Nasopharyngeal swab specimens and should not be used as a sole basis for treatment. Nasal washings and aspirates are unacceptable for Xpert Xpress SARS-CoV-2/FLU/RSV testing.  Fact Sheet for Patients: EntrepreneurPulse.com.au  Fact Sheet for Healthcare Providers: IncredibleEmployment.be  This test is not yet approved or cleared by the [place] FDA and has been authorized for detection and/or diagnosis of SARS-CoV-2 by FDA under an Emergency Use Authorization (EUA). This EUA will remain in effect (meaning this test can be used) for the duration of the COVID-19 declaration under Section 564(b)(1) of the Act, 21 U.S.C. section 360bbb-3(b)(1), unless the authorization is terminated or revoked.     Resp Syncytial Virus by PCR NEGATIVE NEGATIVE Final    Comment: (NOTE) Fact Sheet for Patients: EntrepreneurPulse.com.au  Fact Sheet for Healthcare Providers: IncredibleEmployment.be  This test is not yet approved or cleared by the [place] FDA and has been authorized for detection and/or diagnosis of SARS-CoV-2 by FDA under an Emergency Use Authorization (EUA). This EUA will remain in effect (meaning this test can be used) for the duration of the COVID-19 declaration under Section 564(b)(1) of the Act, 21 U.S.C. section  360bbb-3(b)(1), unless the authorization is terminated or revoked.  Performed at [facility], [address] [place], [place] [address]   Blood Culture (routine x 2)     Status: None (Preliminary result)   Collection Time: 10/17/22  9:29 PM   Specimen: BLOOD  Result Value Ref Range Status   Specimen Description BLOOD BLOOD LEFT FOREARM  Final   Special Requests   Final    BOTTLES DRAWN AEROBIC AND ANAEROBIC Blood Culture results may not be optimal due to an inadequate volume of blood received in culture bottles   Culture   Final    NO GROWTH 3 DAYS Performed at  [facility], [address], [place], [place] [address]    Report Status PENDING  Incomplete  Blood Culture (routine x 2)     Status: None (Preliminary result)   Collection Time: 10/17/22  9:29 PM   Specimen: BLOOD  Result Value Ref Range Status   Specimen Description BLOOD BLOOD RIGHT FOREARM  Final   Special Requests   Final    BOTTLES DRAWN AEROBIC AND ANAEROBIC Blood Culture adequate volume   Culture   Final    NO GROWTH 3 DAYS Performed at [facility], [address], [place], [place] [address]    Report Status PENDING  Incomplete  Culture, blood (Routine X 2) w Reflex to ID Panel     Status: None (Preliminary result)   Collection Time: 10/18/22  3:04 PM   Specimen: BLOOD  Result Value Ref Range Status   Specimen Description BLOOD BLOOD RIGHT HAND  Final   Special Requests   Final    BOTTLES DRAWN AEROBIC AND ANAEROBIC Blood Culture adequate volume   Culture   Final    NO GROWTH 2 DAYS Performed at [facility], [address], [place], [place] [address]    Report Status PENDING  Incomplete  Culture, blood (Routine X 2) w Reflex to ID Panel     Status: None (Preliminary result)   Collection Time: 10/18/22  3:06 PM   Specimen: BLOOD  Result Value Ref Range Status   Specimen Description BLOOD BLOOD LEFT HAND  Final   Special Requests   Final    BOTTLES DRAWN AEROBIC  AND ANAEROBIC Blood Culture adequate volume   Culture   Final    NO GROWTH 2 DAYS Performed at [facility], [address] [place],  [address]    Report Status PENDING  Incomplete   Studies/Results: DG Abd 1 View  Result Date: 10/19/2022 CLINICAL DATA:  Abdominal pain EXAM: ABDOMEN - 1 VIEW COMPARISON:  None Available. FINDINGS: Gaseous distention of colon noted consistent with ileus versus distal obstruction. No organomegaly. No radiopaque calculi. IMPRESSION: Distention consistent with ileus or distal obstruction Colonic gaseous Electronically Signed   By: Sammie Bench M.D.   On: 10/19/2022 18:32   Medications: I have reviewed the patient's current medications. Prior to Admission:  Medications Prior to Admission  Medication Sig Dispense Refill Last Dose   amiodarone (PACERONE) 200 MG tablet Take 200 mg by mouth 2 (two) times daily.   Past Week at Unknown   amLODipine (NORVASC) 10 MG tablet Take 10 mg by mouth daily.    Past Week at Unknown   apixaban (ELIQUIS) 5 MG TABS tablet Take 5 mg by mouth 2 (two) times daily.   Past Week at Unknown   atenolol (TENORMIN) 25 MG tablet Take 25 mg by mouth daily.    Past Week at Unknown   citalopram (CELEXA) 20 MG tablet Take 20 mg by mouth daily.   Past Week at Unknown   clotrimazole (LOTRIMIN) 1 % cream Apply 1 application topically as needed (rash on hands).    Unknown at PRN   doxazosin (CARDURA) 4 MG tablet Take 4 mg by mouth daily.   Past Week at Unknown   furosemide (LASIX) 20 MG tablet Take 20 mg by mouth daily.   Past Week at Unknown   hydrochlorothiazide (HYDRODIURIL) 25 MG tablet Take 25 mg by mouth daily.   Past Week at Unknown   losartan (COZAAR) 100 MG tablet Take 100 mg by mouth daily.  Past Week at Unknown   potassium chloride SA (KLOR-CON M) 20 MEQ tablet Take 20 mEq by mouth daily.   Past Week at Unknown   pravastatin (PRAVACHOL) 20 MG tablet Take 20 mg by mouth at bedtime.   Past Week at Unknown   Scheduled  Meds:  atenolol  25 mg Oral Daily   losartan  100 mg Oral Daily    morphine injection  4 mg Intravenous Once   polyethylene glycol-electrolytes  4,000 mL Oral Once   pravastatin  20 mg Oral QHS   Continuous Infusions:  sodium chloride Stopped (10/19/22 0039)   sodium chloride     cefTRIAXone (ROCEPHIN)  IV Stopped (10/19/22 1446)   metronidazole 500 mg (10/20/22 0929)   PRN Meds:.sodium chloride, acetaminophen **OR** acetaminophen, morphine injection, ondansetron **OR** ondansetron (ZOFRAN) IV, mouth rinse   Assessment: Principal Problem:   Right-sided abdominal pain of unknown etiology Active Problems:   Obesity, Class III, BMI 40-49.9 (morbid obesity) ([place])   Sepsis ([place])   Hypertension   Depression   Colonic mass   History of breast cancer   Paroxysmal A-fib ([place])  Dominique Moore is a 81 y.o. female with history of morbid obesity, hypertension, hyperlipidemia, breast cancer, paroxysmal A-fib on Eliquis, adenomatous polyps of ascending colon, chronic constipation is admitted with right-sided abdominal pain, abdominal distention with shortness of breath.  CT PE protocol negative.  Found to have mild sepsis, started on empiric antibiotics.  CT abdomen pelvis revealed possible ascending colon mass and peripancreatic stranding.  Serum lipase levels were normal.  I personally reviewed CT images and she does have large amount of stool in her right colon   Plan: Right-sided abdominal pain with generalized abdominal distention, severe constipation CT revealed possible ascending colon mass Appreciate general surgery input, did not recommend any surgical intervention at this time Patient responded to aggressive bowel regimen, symptoms have significantly improved Continue clear liquid diet Bowel prep ordered for today, with tentative plan for colonoscopy tomorrow Monitor electrolytes closely, maintain potassium above 4, magnesium above 2 and phosphorus above 3 Patient is on Eliquis for  history of A-fib, she states that at her last dose was about 2 to 3 days prior to admission  I have discussed alternative options, risks & benefits,  which include, but are not limited to, bleeding, infection, perforation,respiratory complication & drug reaction.  The patient agrees with this plan & written consent will be obtained.      LOS: 3 days   Mallery Harshman 10/20/2022, 12:53 PM

## 2022-10-20 NOTE — Progress Notes (Signed)
Progress Note   Patient: Dominique Moore ZHG:[id number] DOB: Feb 12, 1941 DOA: 10/17/2022     3 DOS: the patient was seen and examined on 10/20/2022   Brief hospital course: Taken from H&P.  Dominique Moore is a 81 y.o. female with medical history significant for Breast cancer in 2000, HTN, paroxysmal A-fib on anticoagulation with Eliquis, class III obesity, HTN who presents to the ED with a 2-day history of abdominal pain that is worse with moving or taking of deep breath, associated with shortness of breath.   ED course and Data review: Originally afebrile but then spiked a temperature of 100.4 and became tachypneic to 24 with O2 sat 85% on room air rebounding on its own to 92%.  SBP in the 130s to 140s and pulse in the 70s.  Labs with WBC 12,000 and lactic acid 1.2.  Respiratory panel negative for COVID flu and RSV.  Lipase normal, LFTs pending.  BMP significant for potassium of 3 and urinalysis pending.  EKG, personally viewed and interpreted showing NSR at 69 with nonspecific ST-T wave changes. CTA chest and CT abdomen and pelvis with contrast significant for 2.1 cm serosal mass of colon and fat stranding around the pancreas as outlined below: IMPRESSION: Chest:   1. No evidence of pulmonary embolus. 2. Bibasilar hypoventilatory changes and trace bilateral effusions. No acute airspace disease. 3. Aortic Atherosclerosis (ICD10-I70.0). Coronary artery atherosclerosis   Abdomen/pelvis:   1. Spiculated soft tissue mass along the serosal surface medial aspect ascending colon, measuring up to 2.7 cm. Differential diagnosis includes primary colonic malignancy, metastatic disease, or gastrointestinal stromal tumor. Further evaluation with PET-CT is recommended. 2. Mild peripancreatic fat stranding consistent with acute uncomplicated pancreatitis. Diffuse fatty infiltration of the pancreas. 3. Sigmoid diverticulosis without diverticulitis. 4. Small hiatal hernia. 5.  Aortic Atherosclerosis  (ICD10-I70.0).  She was given IV fluid and started on broad-spectrum antibiotics for concern of sepsis secondary to intra-abdominal infection. GI was consulted.  12/29: Afebrile this morning, preliminary blood cultures negative and 12 hours.  Procalcitonin negative.  Significant abdominal pain and tenderness especially on right mid to lower quadrant. GI is planning colonoscopy on Sunday.  Also requested general surgery evaluation. Switching cefepime with ceftriaxone, will continue Flagyl for now.  12/30: Afebrile this morning, maximum temperature recorded at 101.6 over the past 24-hour.  Blood cultures remain negative.  Potassium has been normalized. GI started her on soft bowel regimen to see if that will help with relieving constipation and symptoms, colonoscopy on 12/31.  Currently on clear liquid.  No bowel movement yet. GI ordered KUB and magnesium citrate to see if that will help.  If there is no concern of bowel obstruction then they will proceed with bowel prep.  12/31: Patient with some improvement in pain and started having bowel movements. She will be given bowel prep today with the planned colonoscopy tomorrow per GI   Assessment and Plan: * Right-sided abdominal pain of unknown etiology Patient presents with a day of abdominal pain with CT showing a 2.7 cm spiculated soft tissue mass on the serosal surface of the ascending colon with differential including primary colonic malignancy, metastatic disease or GIST Peripancreatic fat stranding seen but lipase and hepatic function panel normal. Procalcitonin and preliminary blood cultures negative. 1 febrile episode. GI was consulted-patient was given trial of laxative to see if some decompression of bowel help with her symptoms.  Started getting bowel movements -Colonoscopy tomorrow -Continue with supportive care  Colonic mass Abdominal pain Peripancreatic fat stranding Lipase  and hepatic function panel within normal limit. GI  was consulted.  Colonoscopy most likely on Sunday. Significant tenderness and abdominal distention-General surgery evaluation was also requested, according to them there is no need for emergent surgery and they will be available if needed -Continue with supportive care  Sepsis [facility]) Patient has fever, tachypnea, leukocytosis Suspect SIRS but will treat empirically for sepsis of intra-abdominal source Procalcitonin and preliminary blood cultures negative.  Initially received cefepime and it was switched with ceftriaxone Continue ceftriaxone Continue Flagyl for now  Paroxysmal A-fib ([facility]) Continue amiodarone Will hold Eliquis in case of procedure  History of breast cancer No acute issues suspected  Depression Continue escitalopram  Hypertension Continue home atenolol and losartan  Obesity, Class III, BMI 40-49.9 (morbid obesity) ([place]) Estimated body mass index is 42.91 kg/m as calculated from the following:   Height as of this encounter: '5\' 1"'$  (1.549 m).   Weight as of this encounter: 103 kg.   Potential complicating factor to overall prognosis and care   Subjective: Patient continued to have some abdominal pain but stating that it is much better than before.  Still some right lower quadrant tenderness.  Had multiple bowel movements.  Physical Exam: Vitals:   10/19/22 1447 10/19/22 1944 10/20/22 0457 10/20/22 0921  BP: (!) 123/59 (!) 130/52 (!) 135/58 (!) 139/90  Pulse: 61 68 68 67  Resp: '18 20 20 18  '$ Temp: 99.5 F (37.5 C) 99.9 F (37.7 C) 99.1 F (37.3 C) 99.5 F (37.5 C)  TempSrc:  Oral Oral Oral  SpO2: 93% 95% 93% 93%  Weight:      Height:       General.  Morbidly obese lady, in no acute distress. Pulmonary.  Lungs clear bilaterally, normal respiratory effort. CV.  Regular rate and rhythm, no JVD, rub or murmur. Abdomen.  Distended, RLQ tenderness, bowel sounds positive CNS.  Alert and oriented .  No focal neurologic deficit. Extremities.  No edema, no  cyanosis, pulses intact and symmetrical. Psychiatry.  Judgment and insight appears normal.   Data Reviewed: Prior data reviewed  Family Communication: Discussed with husband at bedside  Disposition: Status is: Inpatient Remains inpatient appropriate because: Severity of illness  Planned Discharge Destination: Home  DVT prophylaxis.  SCDs Time spent: 40 minutes  This record has been created using Systems analyst. Errors have been sought and corrected,but may not always be located. Such creation errors do not reflect on the standard of care.   Author: Lorella Nimrod, MD 10/20/2022 4:54 PM  For on call review [email or web address].

## 2022-10-21 ENCOUNTER — Encounter
Admission: EM | Disposition: A | Discharge status: Home / Self Care | Payer: Self-pay | Source: Home / Self Care | Attending: Internal Medicine

## 2022-10-21 ENCOUNTER — Inpatient Hospital Stay: Payer: Medicare Other | Admitting: Anesthesiology

## 2022-10-21 ENCOUNTER — Encounter: Payer: Self-pay | Admitting: Internal Medicine

## 2022-10-21 DIAGNOSIS — K559 Vascular disorder of intestine, unspecified: Secondary | ICD-10-CM | POA: Diagnosis not present

## 2022-10-21 DIAGNOSIS — R1031 Right lower quadrant pain: Secondary | ICD-10-CM

## 2022-10-21 DIAGNOSIS — R109 Unspecified abdominal pain: Secondary | ICD-10-CM | POA: Diagnosis not present

## 2022-10-21 HISTORY — PX: COLONOSCOPY WITH PROPOFOL: SHX5780

## 2022-10-21 SURGERY — COLONOSCOPY WITH PROPOFOL
Anesthesia: General

## 2022-10-21 MED ORDER — APIXABAN 5 MG PO TABS: 5.0000 mg | ORAL_TABLET | Freq: Two times a day (BID) | ORAL | Status: AC

## 2022-10-21 MED ORDER — LIDOCAINE HCL (CARDIAC) PF 100 MG/5ML IV SOSY
PREFILLED_SYRINGE | INTRAVENOUS | Status: DC | PRN
  Administered 2022-10-21: 40 mg via INTRAVENOUS

## 2022-10-21 MED ORDER — PROPOFOL 500 MG/50ML IV EMUL
INTRAVENOUS | Status: DC | PRN
  Administered 2022-10-21: 150 ug/kg/min via INTRAVENOUS

## 2022-10-21 MED ORDER — PROPOFOL 10 MG/ML IV BOLUS
INTRAVENOUS | Status: DC | PRN
  Administered 2022-10-21: 70 mg via INTRAVENOUS

## 2022-10-21 MED ORDER — AMIODARONE HCL 200 MG PO TABS: 200.0000 mg | ORAL_TABLET | Freq: Two times a day (BID) | ORAL | Status: DC

## 2022-10-21 NOTE — TOC CM/SW Note (Addendum)
Patient currently on acute oxygen. TOC will follow for this potential home need.  Dayton Scrape, CSW [phone]  4:07 pm: Patient has orders to discharge home today. Per RN, patient will require home oxygen. Patient has no agency preference. Ordered through [employer]. Delivery timeframe is within 2 hours. No further concerns. CSW signing off.  Dayton Scrape, CSW [phone]  4:13 pm: [employer] delivery driver will be here in 20 minutes.  Dayton Scrape, [place]

## 2022-10-21 NOTE — Care Management Important Message (Signed)
Important Message  Patient Details  Name: Dominique Moore MRN: [id number] Date of Birth: 1941/06/06   Medicare Important Message Given:  Yes  Reviewed Medicare IM with patient via room phone [phone]).  Copy of Medicare IM placed in mail to home address on file.    Dannette Barbara 10/21/2022, 3:29 PM

## 2022-10-21 NOTE — Discharge Summary (Addendum)
Physician Discharge Summary   Patient: Dominique Moore MRN: [id number] DOB: 02/07/1941  Admit date:     10/17/2022  Discharge date: 10/21/22  Discharge Physician: Lorella Nimrod   PCP: Baxter Hire, MD   Recommendations at discharge:  Please obtain CBC and BMP in 1 week Home amiodarone is held-cardiology can reassess the need. Home HCTZ has been discontinued as patient is on multiple medications which can affect blood pressure, she should avoid hypotension.  Please reassess and make changes appropriately. Follow-up with cardiology Follow-up with gastroenterology Follow-up with primary care provider  Discharge Diagnoses: Principal Problem:   Right-sided abdominal pain of unknown etiology Active Problems:   Colonic mass   Sepsis ([place])   Paroxysmal A-fib ([facility])   Obesity, Class III, BMI 40-49.9 (morbid obesity) ([place])   Hypertension   Depression   History of breast cancer   Ischemic colitis ([place])   RLQ abdominal pain   Hospital Course: Taken from H&P.  Dominique Moore is a 82 y.o. female with medical history significant for Breast cancer in 2000, HTN, paroxysmal A-fib on anticoagulation with Eliquis, class III obesity, HTN who presents to the ED with a 2-day history of abdominal pain that is worse with moving or taking of deep breath, associated with shortness of breath.   ED course and Data review: Originally afebrile but then spiked a temperature of 100.4 and became tachypneic to 24 with O2 sat 85% on room air rebounding on its own to 92%.  SBP in the 130s to 140s and pulse in the 70s.  Labs with WBC 12,000 and lactic acid 1.2.  Respiratory panel negative for COVID flu and RSV.  Lipase normal, LFTs pending.  BMP significant for potassium of 3 and urinalysis pending.  EKG, personally viewed and interpreted showing NSR at 69 with nonspecific ST-T wave changes. CTA chest and CT abdomen and pelvis with contrast significant for 2.1 cm serosal mass of colon and fat stranding around the  pancreas as outlined below: IMPRESSION: Chest:   1. No evidence of pulmonary embolus. 2. Bibasilar hypoventilatory changes and trace bilateral effusions. No acute airspace disease. 3. Aortic Atherosclerosis (ICD10-I70.0). Coronary artery atherosclerosis   Abdomen/pelvis:   1. Spiculated soft tissue mass along the serosal surface medial aspect ascending colon, measuring up to 2.7 cm. Differential diagnosis includes primary colonic malignancy, metastatic disease, or gastrointestinal stromal tumor. Further evaluation with PET-CT is recommended. 2. Mild peripancreatic fat stranding consistent with acute uncomplicated pancreatitis. Diffuse fatty infiltration of the pancreas. 3. Sigmoid diverticulosis without diverticulitis. 4. Small hiatal hernia. 5.  Aortic Atherosclerosis (ICD10-I70.0).  She was given IV fluid and started on broad-spectrum antibiotics for concern of sepsis secondary to intra-abdominal infection. GI was consulted.  12/29: Afebrile this morning, preliminary blood cultures negative and 12 hours.  Procalcitonin negative.  Significant abdominal pain and tenderness especially on right mid to lower quadrant. GI is planning colonoscopy on Sunday.  Also requested general surgery evaluation. Switching cefepime with ceftriaxone, will continue Flagyl for now.  12/30: Afebrile this morning, maximum temperature recorded at 101.6 over the past 24-hour.  Blood cultures remain negative.  Potassium has been normalized. GI started her on soft bowel regimen to see if that will help with relieving constipation and symptoms, colonoscopy on 12/31.  Currently on clear liquid.  No bowel movement yet. GI ordered KUB and magnesium citrate to see if that will help.  If there is no concern of bowel obstruction then they will proceed with bowel prep.  12/31: Patient with some  improvement in pain and started having bowel movements. She will be given bowel prep today with the planned colonoscopy  tomorrow per GI  10/21/22: Hemodynamically stable.  Had her colonoscopy which shows no colonic mass and ischemic colitis involving the ascending colon.  GI is recommending restarting Eliquis in 2 days which can be done on Thursday.  No need to continue antibiotics. Patient might get benefit from vascular surgery evaluation if her symptoms persist. She was instructed to avoid constipation and keep herself well-hydrated.  Her home amiodarone was held until she sees her cardiologist as her heart rate remained mostly in 50s without it while in the hospital.  HCTZ was discontinued. She is on multiple medications which can affect her blood pressure, should avoid hypotension.  Patient was also desaturating at rest on room air and requiring up to 4 L of oxygen.  She was found to be desaturating in high 60s to low 70s on room air.  Saturation improved to low to mid 90s on 4 L.  Patient will continue with her home medications except with the changes mentioned above and need to have a close follow-up with her providers for further recommendations.  Assessment and Plan: * Right-sided abdominal pain of unknown etiology Patient presents with a day of abdominal pain with CT showing a 2.7 cm spiculated soft tissue mass on the serosal surface of the ascending colon with differential including primary colonic malignancy, metastatic disease or GIST Peripancreatic fat stranding seen but lipase and hepatic function panel normal. Procalcitonin and preliminary blood cultures negative. 1 febrile episode. GI was consulted-patient was given trial of laxative to see if some decompression of bowel help with her symptoms.  Started getting bowel movements -Colonoscopy tomorrow -Continue with supportive care  Colonic mass Abdominal pain Peripancreatic fat stranding Lipase and hepatic function panel within normal limit. GI was consulted. . Significant tenderness and abdominal distention-General surgery evaluation was also  requested, according to them there is no need for emergent surgery and they will be available if needed -Colonoscopy rule out any colonic mass -Continue with supportive care  Sepsis [facility]) Patient has fever, tachypnea, leukocytosis Suspect SIRS but will treat empirically for sepsis of intra-abdominal source Procalcitonin and preliminary blood cultures negative.  Initially received cefepime and it was switched with ceftriaxone with Flagyl Colonoscopy with ischemic colitis-sepsis ruled out as there is no obvious infection  Paroxysmal A-fib (HCC) Continue amiodarone Will hold Eliquis in case of procedure  Ischemic colitis ([place]) Colonoscopy consistent with ischemic colitis of ascending colon.  History of breast cancer No acute issues suspected  Depression Continue escitalopram  Hypertension Continue home atenolol and losartan  Obesity, Class III, BMI 40-49.9 (morbid obesity) ([place]) Estimated body mass index is 42.91 kg/m as calculated from the following:   Height as of this encounter: '5\' 1"'$  (1.549 m).   Weight as of this encounter: 103 kg.   Potential complicating factor to overall prognosis and care   Consultants: Gastroenterology Procedures performed: Colonoscopy Disposition: Home Diet recommendation:  Discharge Diet Orders (From admission, onward)     Start     Ordered   10/21/22 0000  Diet - low sodium heart healthy        10/21/22 1443           Cardiac diet DISCHARGE MEDICATION: Allergies as of 10/21/2022       Reactions   Ace Inhibitors Swelling   Feet swelling   Avelox [moxifloxacin] Swelling   Codeine Nausea And Vomiting   Penicillins Hives  IgE = 123 (WNL) on 09/07/2020        Medication List     STOP taking these medications    hydrochlorothiazide 25 MG tablet Commonly known as: HYDRODIURIL       TAKE these medications    amiodarone 200 MG tablet Commonly known as: PACERONE Take 1 tablet (200 mg total) by mouth 2 (two) times daily.  Hold until you see your cardiologist What changed: additional instructions   amLODipine 10 MG tablet Commonly known as: NORVASC Take 10 mg by mouth daily.   apixaban 5 MG Tabs tablet Commonly known as: ELIQUIS Take 1 tablet (5 mg total) by mouth 2 (two) times daily. Restart from Thursday What changed: additional instructions   atenolol 25 MG tablet Commonly known as: TENORMIN Take 25 mg by mouth daily.   citalopram 20 MG tablet Commonly known as: CELEXA Take 20 mg by mouth daily.   clotrimazole 1 % cream Commonly known as: LOTRIMIN Apply 1 application topically as needed (rash on hands).   doxazosin 4 MG tablet Commonly known as: CARDURA Take 4 mg by mouth daily.   furosemide 20 MG tablet Commonly known as: LASIX Take 20 mg by mouth daily.   losartan 100 MG tablet Commonly known as: COZAAR Take 100 mg by mouth daily.   potassium chloride SA 20 MEQ tablet Commonly known as: KLOR-CON M Take 20 mEq by mouth daily.   pravastatin 20 MG tablet Commonly known as: PRAVACHOL Take 20 mg by mouth at bedtime.        Follow-up Information     Baxter Hire, MD. Schedule an appointment as soon as possible for a visit in 1 week(s).   Specialty: Internal Medicine Contact information: [place] [address] [phone]         Lin Landsman, MD. Schedule an appointment as soon as possible for a visit in 1 week(s).   Specialty: Gastroenterology Contact information: [place] [address] [phone]                Discharge Exam: Danley Danker Weights   10/17/22 1608  Weight: 103 kg   General.     In no acute distress. Pulmonary.  Lungs clear bilaterally, normal respiratory effort. CV.  Regular rate and rhythm, no JVD, rub or murmur. Abdomen.  Soft, nontender, nondistended, BS positive. CNS.  Alert and oriented .  No focal neurologic deficit. Extremities.  No edema, no cyanosis, pulses intact and  symmetrical. Psychiatry.  Judgment and insight appears normal.   Condition at discharge: stable  The results of significant diagnostics from this hospitalization (including imaging, microbiology, ancillary and laboratory) are listed below for reference.   Imaging Studies: DG Abd 1 View  Result Date: 10/19/2022 CLINICAL DATA:  Abdominal pain EXAM: ABDOMEN - 1 VIEW COMPARISON:  None Available. FINDINGS: Gaseous distention of colon noted consistent with ileus versus distal obstruction. No organomegaly. No radiopaque calculi. IMPRESSION: Distention consistent with ileus or distal obstruction Colonic gaseous Electronically Signed   By: Sammie Bench M.D.   On: 10/19/2022 18:32   CT Angio Chest PE W/Cm &/Or Wo Cm  Result Date: 10/17/2022 CLINICAL DATA:  Right-sided back pain radiating to right flank and inguinal region, nausea EXAM: CT ANGIOGRAPHY CHEST CT ABDOMEN AND PELVIS WITH CONTRAST TECHNIQUE: Multidetector CT imaging of the chest was performed using the standard protocol during bolus administration of intravenous contrast. Multiplanar CT image reconstructions and MIPs were obtained to evaluate the vascular anatomy. Multidetector CT  imaging of the abdomen and pelvis was performed using the standard protocol during bolus administration of intravenous contrast. RADIATION DOSE REDUCTION: This exam was performed according to the departmental dose-optimization program which includes automated exposure control, adjustment of the mA and/or kV according to patient size and/or use of iterative reconstruction technique. CONTRAST:  172m OMNIPAQUE IOHEXOL 350 MG/ML SOLN COMPARISON:  10/09/2022 FINDINGS: CTA CHEST FINDINGS Cardiovascular: This is a technically adequate evaluation of the pulmonary vasculature. No filling defects or pulmonary emboli. The heart is enlarged without pericardial effusion. No evidence of thoracic aortic aneurysm or dissection. Atherosclerosis of the aorta and coronary vasculature.  Mediastinum/Nodes: No enlarged mediastinal, hilar, or axillary lymph nodes. Thyroid gland, trachea, and esophagus demonstrate no significant findings. Small hiatal hernia. Lungs/Pleura: Hypoventilatory changes are seen at the lung bases. Trace bilateral pleural effusions. No airspace disease or pneumothorax. Central airways are patent. Musculoskeletal: No acute or destructive bony lesions. Reconstructed images demonstrate no additional findings. Review of the MIP images confirms the above findings. CT ABDOMEN and PELVIS FINDINGS Hepatobiliary: No focal liver abnormality is seen. No gallstones, gallbladder wall thickening, or biliary dilatation. Pancreas: Diffuse fatty infiltration of the pancreas. There is some mild fat stranding along the ventral aspect of the pancreatic body consistent with acute pancreatitis. No fluid collection, pseudocyst, or abscess. Spleen: Normal in size without focal abnormality. Adrenals/Urinary Tract: Adrenal glands are unremarkable. Kidneys are normal, without renal calculi, focal lesion, or hydronephrosis. Bladder is unremarkable. Stomach/Bowel: No bowel obstruction or ileus. Scattered colonic diverticulosis without diverticulitis. Normal appendix right lower quadrant. A spiculated soft tissue mass identified along the medial aspect of the ascending colon, measuring 2.3 x 2.7 x 2.1 cm reference image 41/5. Findings are highly concerning for malignancy, including primary colonic malignancy, metastatic disease, or gastrointestinal stromal tumor. There is loss of normal fat plane between this mass and the serosal surface of the ascending colon. Further evaluation with PET CT is recommended. Vascular/Lymphatic: Aortic atherosclerosis. No enlarged abdominal or pelvic lymph nodes. Reproductive: Uterus and bilateral adnexa are unremarkable. Other: No free fluid or free intraperitoneal gas. Small fat containing umbilical hernia. Musculoskeletal: No acute or destructive bony lesions.  Reconstructed images demonstrate no additional findings. Review of the MIP images confirms the above findings. IMPRESSION: Chest: 1. No evidence of pulmonary embolus. 2. Bibasilar hypoventilatory changes and trace bilateral effusions. No acute airspace disease. 3. Aortic Atherosclerosis (ICD10-I70.0). Coronary artery atherosclerosis Abdomen/pelvis: 1. Spiculated soft tissue mass along the serosal surface medial aspect ascending colon, measuring up to 2.7 cm. Differential diagnosis includes primary colonic malignancy, metastatic disease, or gastrointestinal stromal tumor. Further evaluation with PET-CT is recommended. 2. Mild peripancreatic fat stranding consistent with acute uncomplicated pancreatitis. Diffuse fatty infiltration of the pancreas. 3. Sigmoid diverticulosis without diverticulitis. 4. Small hiatal hernia. 5.  Aortic Atherosclerosis (ICD10-I70.0). Electronically Signed   By: MRanda NgoM.D.   On: 10/17/2022 21:00   CT Abdomen Pelvis W Contrast  Result Date: 10/17/2022 CLINICAL DATA:  Right-sided back pain radiating to right flank and inguinal region, nausea EXAM: CT ANGIOGRAPHY CHEST CT ABDOMEN AND PELVIS WITH CONTRAST TECHNIQUE: Multidetector CT imaging of the chest was performed using the standard protocol during bolus administration of intravenous contrast. Multiplanar CT image reconstructions and MIPs were obtained to evaluate the vascular anatomy. Multidetector CT imaging of the abdomen and pelvis was performed using the standard protocol during bolus administration of intravenous contrast. RADIATION DOSE REDUCTION: This exam was performed according to the departmental dose-optimization program which includes automated exposure control, adjustment of the mA  and/or kV according to patient size and/or use of iterative reconstruction technique. CONTRAST:  17m OMNIPAQUE IOHEXOL 350 MG/ML SOLN COMPARISON:  10/09/2022 FINDINGS: CTA CHEST FINDINGS Cardiovascular: This is a technically adequate  evaluation of the pulmonary vasculature. No filling defects or pulmonary emboli. The heart is enlarged without pericardial effusion. No evidence of thoracic aortic aneurysm or dissection. Atherosclerosis of the aorta and coronary vasculature. Mediastinum/Nodes: No enlarged mediastinal, hilar, or axillary lymph nodes. Thyroid gland, trachea, and esophagus demonstrate no significant findings. Small hiatal hernia. Lungs/Pleura: Hypoventilatory changes are seen at the lung bases. Trace bilateral pleural effusions. No airspace disease or pneumothorax. Central airways are patent. Musculoskeletal: No acute or destructive bony lesions. Reconstructed images demonstrate no additional findings. Review of the MIP images confirms the above findings. CT ABDOMEN and PELVIS FINDINGS Hepatobiliary: No focal liver abnormality is seen. No gallstones, gallbladder wall thickening, or biliary dilatation. Pancreas: Diffuse fatty infiltration of the pancreas. There is some mild fat stranding along the ventral aspect of the pancreatic body consistent with acute pancreatitis. No fluid collection, pseudocyst, or abscess. Spleen: Normal in size without focal abnormality. Adrenals/Urinary Tract: Adrenal glands are unremarkable. Kidneys are normal, without renal calculi, focal lesion, or hydronephrosis. Bladder is unremarkable. Stomach/Bowel: No bowel obstruction or ileus. Scattered colonic diverticulosis without diverticulitis. Normal appendix right lower quadrant. A spiculated soft tissue mass identified along the medial aspect of the ascending colon, measuring 2.3 x 2.7 x 2.1 cm reference image 41/5. Findings are highly concerning for malignancy, including primary colonic malignancy, metastatic disease, or gastrointestinal stromal tumor. There is loss of normal fat plane between this mass and the serosal surface of the ascending colon. Further evaluation with PET CT is recommended. Vascular/Lymphatic: Aortic atherosclerosis. No enlarged  abdominal or pelvic lymph nodes. Reproductive: Uterus and bilateral adnexa are unremarkable. Other: No free fluid or free intraperitoneal gas. Small fat containing umbilical hernia. Musculoskeletal: No acute or destructive bony lesions. Reconstructed images demonstrate no additional findings. Review of the MIP images confirms the above findings. IMPRESSION: Chest: 1. No evidence of pulmonary embolus. 2. Bibasilar hypoventilatory changes and trace bilateral effusions. No acute airspace disease. 3. Aortic Atherosclerosis (ICD10-I70.0). Coronary artery atherosclerosis Abdomen/pelvis: 1. Spiculated soft tissue mass along the serosal surface medial aspect ascending colon, measuring up to 2.7 cm. Differential diagnosis includes primary colonic malignancy, metastatic disease, or gastrointestinal stromal tumor. Further evaluation with PET-CT is recommended. 2. Mild peripancreatic fat stranding consistent with acute uncomplicated pancreatitis. Diffuse fatty infiltration of the pancreas. 3. Sigmoid diverticulosis without diverticulitis. 4. Small hiatal hernia. 5.  Aortic Atherosclerosis (ICD10-I70.0). Electronically Signed   By: MRanda NgoM.D.   On: 10/17/2022 21:00   CT Renal Stone Study  Result Date: 10/17/2022 CLINICAL DATA:  Abdominal pain, flank pain EXAM: CT ABDOMEN AND PELVIS WITHOUT CONTRAST TECHNIQUE: Multidetector CT imaging of the abdomen and pelvis was performed following the standard protocol without IV contrast. RADIATION DOSE REDUCTION: This exam was performed according to the departmental dose-optimization program which includes automated exposure control, adjustment of the mA and/or kV according to patient size and/or use of iterative reconstruction technique. COMPARISON:  None Available. FINDINGS: Lower chest: There are small linear patchy infiltrates in the lower lung fields, more so on the right side. Heart is enlarged in size. Minimal right pleural effusion is seen. Hepatobiliary: No focal  abnormalities are seen. Anterior bulging is seen in the left lobe of liver, possibly within ventral hernia. There is no dilation of bile ducts. Gallbladder is unremarkable. Pancreas: There is fatty infiltration in the  pancreas, more so in the head. There is minimal stranding in the fat planes adjacent to head of the pancreas. There are no loculated fluid collections in or around the pancreas. Spleen: Unremarkable. Adrenals/Urinary Tract: Adrenals are unremarkable. There is no hydronephrosis. There are no renal or ureteral stones. Urinary bladder is not distended. Stomach/Bowel: Small hiatal hernia is seen. Stomach is unremarkable. Small bowel loops are not dilated. Appendix is difficult to visualize. In image 62 of series 5, there is a small caliber tubular structure inseparable from tip of cecum, possibly normal appendix. There is no focal pericecal inflammation. Multiple diverticula are seen in colon without focal acute diverticulitis. Vascular/Lymphatic: Scattered arterial calcifications are seen. In image 33 of series 2, there is 2.5 x 2.3 cm nodular density adjacent to the medial margin of ascending colon. There are few subcentimeter nodes in retroperitoneum. Reproductive: Unremarkable. Other: There is no ascites or pneumoperitoneum. Umbilical hernia containing fat is seen. Musculoskeletal: Degenerative changes are noted in lumbar spine. IMPRESSION: There is no evidence of intestinal obstruction or pneumoperitoneum. There is no hydronephrosis. Appendix is not dilated. There is 2.5 cm smooth marginated soft tissue density lesion adjacent to the medial margin of ascending colon. Possibility of metastatic lymphadenopathy is not excluded. Short-term follow-up CT abdomen and pelvis in 3 months along with PET-CT if warranted should be considered. If the patient has any known primary malignancy, immediate further evaluation with PET-CT may be considered. There is minimal stranding in fat planes adjacent to the head of  the pancreas which may be a normal variation or suggest early changes of acute pancreatitis. Please correlate with laboratory findings. There is no loculated fluid collection in or around the pancreas. Small hiatal hernia. Increased markings in the lower lung fields, more so on the right side may suggest scarring or atelectasis/pneumonia. Minimal right pleural effusion. Diverticulosis of colon without signs of focal diverticulitis. Other findings as described in the body of the report. Electronically Signed   By: Elmer Picker M.D.   On: 10/17/2022 16:49    Microbiology: Results for orders placed or performed during the hospital encounter of 10/17/22  Urine Culture     Status: None   Collection Time: 10/17/22  3:30 PM   Specimen: In/Out Cath Urine  Result Value Ref Range Status   Specimen Description   Final    IN/OUT CATH URINE Performed at [facility], [address], [place], [place] [address]    Special Requests   Final    NONE Performed at [facility], [address], [place], [place] [address]    Culture   Final    NO GROWTH Performed at [facility], [place] [address], [place],  [address]    Report Status 10/19/2022 FINAL  Final  Resp panel by RT-PCR (RSV, Flu A&B, Covid) Anterior Nasal Swab     Status: None   Collection Time: 10/17/22  7:10 PM   Specimen: Anterior Nasal Swab  Result Value Ref Range Status   SARS Coronavirus 2 by RT PCR NEGATIVE NEGATIVE Final    Comment: (NOTE) SARS-CoV-2 target nucleic acids are NOT DETECTED.  The SARS-CoV-2 RNA is generally detectable in upper respiratory specimens during the acute phase of infection. The lowest concentration of SARS-CoV-2 viral copies this assay can detect is 138 copies/mL. A negative result does not preclude SARS-Cov-2 infection and should not be used as the sole basis for treatment or other patient management decisions. A negative result may occur with  improper specimen  collection/handling, submission of specimen other than nasopharyngeal swab, presence of viral mutation(s) within the areas targeted by this assay, and inadequate number of viral copies(<138 copies/mL). A negative result must be combined with clinical observations, patient history, and epidemiological information. The expected result is Negative.  Fact Sheet for Patients:  EntrepreneurPulse.com.au  Fact Sheet for Healthcare Providers:  IncredibleEmployment.be  This test is no t yet approved or cleared by the [place] FDA and  has been authorized for detection and/or diagnosis of SARS-CoV-2 by FDA under an Emergency Use Authorization (EUA). This EUA will remain  in effect (meaning this test can be used) for the duration of the COVID-19 declaration under Section 564(b)(1) of the Act, 21 U.S.C.section 360bbb-3(b)(1), unless the authorization is terminated  or revoked sooner.       Influenza A by PCR NEGATIVE NEGATIVE Final   Influenza B by PCR NEGATIVE NEGATIVE Final    Comment: (NOTE) The Xpert Xpress SARS-CoV-2/FLU/RSV plus assay is intended as an aid in the diagnosis of influenza from Nasopharyngeal swab specimens and should not be used as a sole basis for treatment. Nasal washings and aspirates are unacceptable for Xpert Xpress SARS-CoV-2/FLU/RSV testing.  Fact Sheet for Patients: EntrepreneurPulse.com.au  Fact Sheet for Healthcare Providers: IncredibleEmployment.be  This test is not yet approved or cleared by the [place] FDA and has been authorized for detection and/or diagnosis of SARS-CoV-2 by FDA under an Emergency Use Authorization (EUA). This EUA will remain in effect (meaning this test can be used) for the duration of the COVID-19 declaration under Section 564(b)(1) of the Act, 21 U.S.C. section 360bbb-3(b)(1), unless the authorization is terminated or revoked.     Resp Syncytial  Virus by PCR NEGATIVE NEGATIVE Final    Comment: (NOTE) Fact Sheet for Patients: EntrepreneurPulse.com.au  Fact Sheet for Healthcare Providers: IncredibleEmployment.be  This test is not yet approved or cleared by the [place] FDA and has been authorized for detection and/or diagnosis of SARS-CoV-2 by FDA under an Emergency Use Authorization (EUA). This EUA will remain in effect (meaning this test can be used) for the duration of the COVID-19 declaration under Section 564(b)(1) of the Act, 21 U.S.C. section 360bbb-3(b)(1), unless the authorization is terminated or revoked.  Performed at [facility], [address] [place], [place] [address]   Blood Culture (routine x 2)     Status: None (Preliminary result)   Collection Time: 10/17/22  9:29 PM   Specimen: BLOOD  Result Value Ref Range Status   Specimen Description BLOOD BLOOD LEFT FOREARM  Final   Special Requests   Final    BOTTLES DRAWN AEROBIC AND ANAEROBIC Blood Culture results may not be optimal due to an inadequate volume of blood received in culture bottles   Culture   Final    NO GROWTH 4 DAYS Performed at [facility], [address], [place], [place] [address]    Report Status PENDING  Incomplete  Blood Culture (routine x 2)     Status: None (Preliminary result)   Collection Time: 10/17/22  9:29 PM   Specimen: BLOOD  Result Value Ref Range Status   Specimen Description BLOOD BLOOD RIGHT FOREARM  Final   Special Requests   Final    BOTTLES DRAWN AEROBIC AND ANAEROBIC Blood Culture adequate volume   Culture   Final    NO GROWTH 4 DAYS Performed at [facility], [address] [place], [place] [address]    Report Status PENDING  Incomplete  Culture, blood (Routine  X 2) w Reflex to ID Panel     Status: None (Preliminary result)   Collection Time: 10/18/22  3:04 PM   Specimen: BLOOD  Result Value Ref Range Status   Specimen Description BLOOD  BLOOD RIGHT HAND  Final   Special Requests   Final    BOTTLES DRAWN AEROBIC AND ANAEROBIC Blood Culture adequate volume   Culture   Final    NO GROWTH 3 DAYS Performed at [facility], [address] [place], [place] [address]    Report Status PENDING  Incomplete  Culture, blood (Routine X 2) w Reflex to ID Panel     Status: None (Preliminary result)   Collection Time: 10/18/22  3:06 PM   Specimen: BLOOD  Result Value Ref Range Status   Specimen Description BLOOD BLOOD LEFT HAND  Final   Special Requests   Final    BOTTLES DRAWN AEROBIC AND ANAEROBIC Blood Culture adequate volume   Culture   Final    NO GROWTH 3 DAYS Performed at [facility], [address] [place], [place] [address]    Report Status PENDING  Incomplete    Labs: CBC: Recent Labs  Lab 10/17/22 1610 10/19/22 1601  WBC 12.1* 15.4*  NEUTROABS  --  13.6*  HGB 12.1 11.4*  HCT 36.8 35.3*  MCV 84.0 86.3  PLT 189 814   Basic Metabolic Panel: Recent Labs  Lab 10/17/22 1610 10/18/22 1504 10/19/22 0413  NA 138  --  141  K 3.0* 3.0* 4.0  CL 101  --  107  CO2 27  --  28  GLUCOSE 169*  --  164*  BUN 14  --  21  CREATININE 0.89  --  0.84  CALCIUM 9.6  --  8.6*   Liver Function Tests: Recent Labs  Lab 10/17/22 2050  AST 27  ALT 20  ALKPHOS 55  BILITOT 0.8  PROT 6.6  ALBUMIN 3.4*   CBG: No results for input(s): "GLUCAP" in the last 168 hours.  Discharge time spent: greater than 30 minutes.  This record has been created using Systems analyst. Errors have been sought and corrected,but may not always be located. Such creation errors do not reflect on the standard of care.   Signed: Lorella Nimrod, MD [facility] 10/21/2022

## 2022-10-21 NOTE — Progress Notes (Signed)
Patient sat on side of bed without oxygen. Her sats were 69% then increased to 79%. Instructed to do deep brets but did n ot increase o2 sat. Place pt on 4LNC sats immediately went 92-93%

## 2022-10-21 NOTE — Op Note (Signed)
[facility] Gastroenterology Patient Name: Dominique Moore Procedure Date: 10/21/2022 11:01 AM MRN: [id number] Account #: [id number] Date of Birth: 02-25-1941 Admit Type: Inpatient Age: 82 Room: [facility] ENDO ROOM 4 Gender: Female Note Status: Finalized Instrument Name: Jasper Riling [id number] Procedure:             Colonoscopy Indications:           Abdominal pain in the right lower quadrant, Abnormal                         CT of the GI tract Providers:             Lin Landsman MD, MD Referring MD:          Andres Labrum, MD (Referring MD) Medicines:             General Anesthesia Complications:         No immediate complications. Estimated blood loss:                         Minimal. Procedure:             Pre-Anesthesia Assessment:                        - Prior to the procedure, a History and Physical was                         performed, and patient medications and allergies were                         reviewed. The patient is competent. The risks and                         benefits of the procedure and the sedation options and                         risks were discussed with the patient. All questions                         were answered and informed consent was obtained.                         Patient identification and proposed procedure were                         verified by the physician, the nurse, the                         anesthesiologist, the anesthetist and the technician                         in the pre-procedure area in the procedure room in the                         endoscopy suite. Mental Status Examination: alert and                         oriented. Airway Examination: normal oropharyngeal  airway and neck mobility. Respiratory Examination:                         clear to auscultation. CV Examination: normal.                         Prophylactic Antibiotics: The patient does not require                          prophylactic antibiotics. Prior Anticoagulants: The                         patient has taken Eliquis (apixaban), last dose was 5                         days prior to procedure. ASA Grade Assessment: III - A                         patient with severe systemic disease. After reviewing                         the risks and benefits, the patient was deemed in                         satisfactory condition to undergo the procedure. The                         anesthesia plan was to use general anesthesia.                         Immediately prior to administration of medications,                         the patient was re-assessed for adequacy to receive                         sedatives. The heart rate, respiratory rate, oxygen                         saturations, blood pressure, adequacy of pulmonary                         ventilation, and response to care were monitored                         throughout the procedure. The physical status of the                         patient was re-assessed after the procedure.                        After obtaining informed consent, the colonoscope was                         passed under direct vision. Throughout the procedure,                         the patient's blood pressure, pulse, and oxygen  saturations were monitored continuously. The                         Colonoscope was introduced through the anus and                         advanced to the the cecum, identified by appendiceal                         orifice and ileocecal valve. The colonoscopy was                         extremely difficult due to inadequate bowel prep,                         significant looping and the patient's body habitus.                         Successful completion of the procedure was aided by                         applying abdominal pressure. The patient tolerated the                         procedure well. The quality of the bowel  preparation                         was poor. The ileocecal valve, appendiceal orifice,                         and rectum were photographed. Findings:      The perianal and digital rectal examinations were normal. Pertinent       negatives include normal sphincter tone and no palpable rectal lesions.      A continuous area of nonbleeding ulcerated mucosa with no stigmata of       recent bleeding was present in the proximal ascending colon consistent       with ischemic colitis. Biopsies were taken with a cold forceps for       histology. Estimated blood loss: none.      Unable to intubate TI despite several attempts due to significant       looping and body habitus. Normal appearing IC valve      Normal mucosa was found in the rectum, in the sigmoid colon, in the       descending colon and in the transverse colon.      Non-bleeding external and internal hemorrhoids were found during       retroflexion. The hemorrhoids were medium-sized. Impression:            - Preparation of the colon was poor.                        - Mucosal ulceration in the proximal ascending colon.                         Biopsied.                        - Normal mucosa in the rectum, in the sigmoid colon,  in the descending colon and in the transverse colon.                        - Non-bleeding external and internal hemorrhoids. Recommendation:        - Return patient to hospital ward for possible                         discharge same day.                        - High fiber diet indefinitely.                        - Await pathology results.                        - Resume Eliquis (apixaban) in 2 days at prior dose.                         Refer to managing physician for further adjustment of                         therapy.                        - Miralax 1 capful (17 grams) in 8 ounces of water PO                         daily. Procedure Code(s):     --- Professional ---                         [phone], Colonoscopy, flexible; with biopsy, single or                         multiple Diagnosis Code(s):     --- Professional ---                        K63.3, Ulcer of intestine                        K64.8, Other hemorrhoids                        R10.31, Right lower quadrant pain                        R93.3, Abnormal findings on diagnostic imaging of                         other parts of digestive tract CPT copyright 2022 American Medical Association. All rights reserved. The codes documented in this report are preliminary and upon coder review may  be revised to meet current compliance requirements. Dr. Ulyess Mort Lin Landsman MD, MD 10/21/2022 12:17:16 PM This report has been signed electronically. Number of Addenda: 0 Note Initiated On: 10/21/2022 11:01 AM Scope Withdrawal Time: 0 hours 13 minutes 55 seconds  Total Procedure Duration: 0 hours 33 minutes 26 seconds  Estimated Blood Loss:  Estimated blood loss was minimal.      [facility]

## 2022-10-21 NOTE — Anesthesia Procedure Notes (Signed)
Date/Time: 10/21/2022 11:43 AM  Performed by: Doreen Salvage, CRNAPre-anesthesia Checklist: Patient identified, Emergency Drugs available, Suction available and Patient being monitored Patient Re-evaluated:Patient Re-evaluated prior to induction Oxygen Delivery Method: Simple face mask Induction Type: IV induction Dental Injury: Teeth and Oropharynx as per pre-operative assessment

## 2022-10-21 NOTE — Transfer of Care (Signed)
Immediate Anesthesia Transfer of Care Note  Patient: Dominique Moore  Procedure(s) Performed: Procedure(s): COLONOSCOPY WITH PROPOFOL (N/A)  Patient Location: PACU and Endoscopy Unit  Anesthesia Type:General  Level of Consciousness: sedated  Airway & Oxygen Therapy: Patient Spontanous Breathing and Patient connected to nasal cannula oxygen  Post-op Assessment: Report given to RN and Post -op Vital signs reviewed and stable  Post vital signs: Reviewed and stable  Last Vitals:  Vitals:   10/21/22 1119 10/21/22 1219  BP: (!) 148/72 99/66  Pulse: 63 (!) 52  Resp: 18 18  Temp:    SpO2: 96% 29%    Complications: No apparent anesthesia complications

## 2022-10-21 NOTE — Anesthesia Postprocedure Evaluation (Signed)
Anesthesia Post Note  Patient: Dominique Moore  Procedure(s) Performed: COLONOSCOPY WITH PROPOFOL  Patient location during evaluation: Endoscopy Anesthesia Type: General Level of consciousness: awake and alert Pain management: pain level controlled Vital Signs Assessment: post-procedure vital signs reviewed and stable Respiratory status: spontaneous breathing, nonlabored ventilation, respiratory function stable and patient connected to nasal cannula oxygen Cardiovascular status: blood pressure returned to baseline and stable Postop Assessment: no apparent nausea or vomiting Anesthetic complications: no   No notable events documented.   Last Vitals:  Vitals:   10/21/22 1457 10/21/22 1500  BP: (!) 164/69   Pulse: 62   Resp: 18   Temp: 37.5 C   SpO2: 95% 92%    Last Pain:  Vitals:   10/21/22 1457  TempSrc: Oral  PainSc:                  Precious Haws Jeffry Vogelsang

## 2022-10-21 NOTE — Progress Notes (Addendum)
Pt provided discharge instructions and education. Awaiting oxygen. IV removed no complications. No further questions or concerns

## 2022-10-21 NOTE — Assessment & Plan Note (Signed)
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

## 2022-10-21 NOTE — Anesthesia Preprocedure Evaluation (Signed)
Anesthesia Evaluation  Patient identified by MRN, date of birth, ID band Patient awake    Reviewed: Allergy & Precautions, NPO status , Patient's Chart, lab work & pertinent test results  History of Anesthesia Complications (+) PONV and history of anesthetic complications  Airway Mallampati: III  TM Distance: <3 FB Neck ROM: full    Dental  (+) Chipped, Poor Dentition, Missing   Pulmonary shortness of breath and with exertion, sleep apnea    Pulmonary exam normal        Cardiovascular Exercise Tolerance: Good hypertension, + dysrhythmias Atrial Fibrillation      Neuro/Psych  PSYCHIATRIC DISORDERS      negative neurological ROS     GI/Hepatic negative GI ROS, Neg liver ROS,,,  Endo/Other  diabetes, Type 2    Renal/GU negative Renal ROS  negative genitourinary   Musculoskeletal   Abdominal   Peds  Hematology negative hematology ROS (+)   Anesthesia Other Findings Past Medical History: 2000: Breast cancer ([place])     Comment:  left breast lumpectomy with rad tx No date: Complication of anesthesia No date: Depression No date: Hyperlipidemia No date: Hypertension No date: Osteoporosis No date: Personal history of radiation therapy No date: PONV (postoperative nausea and vomiting) No date: Pre-diabetes No date: Sleep apnea     Comment:  no CPAP  Past Surgical History: 2000: BREAST BIOPSY; Left     Comment:  positive No date: BREAST LUMPECTOMY No date: CATARACT EXTRACTION; Bilateral 02/22/2016: COLONOSCOPY WITH PROPOFOL; N/A     Comment:  Procedure: COLONOSCOPY WITH PROPOFOL;  Surgeon: Manya Silvas, MD;  Location: [facility] ENDOSCOPY;  Service:               Endoscopy;  Laterality: N/A; No date: EYE SURGERY     Comment:  bilateral catracts No date: JOINT REPLACEMENT 08/27/2017: KNEE ARTHROPLASTY; Right     Comment:  Procedure: COMPUTER ASSISTED TOTAL KNEE ARTHROPLASTY;                Surgeon:  Dereck Leep, MD;  Location: [facility] ORS;                Service: Orthopedics;  Laterality: Right; 09/20/2020: KNEE ARTHROPLASTY; Left     Comment:  Procedure: COMPUTER ASSISTED TOTAL KNEE ARTHROPLASTY;                Surgeon: Dereck Leep, MD;  Location: [facility] ORS;                Service: Orthopedics;  Laterality: Left;  BMI    Body Mass Index: 42.91 kg/m      Reproductive/Obstetrics negative OB ROS                             Anesthesia Physical Anesthesia Plan  ASA: 3  Anesthesia Plan: General   Post-op Pain Management:    Induction: Intravenous  PONV Risk Score and Plan: Propofol infusion and TIVA  Airway Management Planned: Natural Airway and Nasal Cannula  Additional Equipment:   Intra-op Plan:   Post-operative Plan:   Informed Consent: I have reviewed the patients History and Physical, chart, labs and discussed the procedure including the risks, benefits and alternatives for the proposed anesthesia with the patient or authorized representative who has indicated his/her understanding and acceptance.     Dental Advisory Given  Plan Discussed with: Anesthesiologist, CRNA and  Surgeon  Anesthesia Plan Comments: (Patient consented for risks of anesthesia including but not limited to:  - adverse reactions to medications - risk of airway placement if required - damage to eyes, teeth, lips or other oral mucosa - nerve damage due to positioning  - sore throat or hoarseness - Damage to heart, brain, nerves, lungs, other parts of body or loss of life  Patient voiced understanding.)       Anesthesia Quick Evaluation

## 2022-10-22 ENCOUNTER — Encounter: Payer: Self-pay | Admitting: Gastroenterology

## 2022-10-22 LAB — CULTURE, BLOOD (ROUTINE X 2)
Culture: NO GROWTH
Culture: NO GROWTH
Special Requests: ADEQUATE

## 2022-10-23 LAB — CULTURE, BLOOD (ROUTINE X 2)
Culture: NO GROWTH
Culture: NO GROWTH
Special Requests: ADEQUATE
Special Requests: ADEQUATE

## 2022-10-23 LAB — SURGICAL PATHOLOGY

## 2022-11-05 ENCOUNTER — Other Ambulatory Visit: Payer: Self-pay | Admitting: Gastroenterology

## 2022-11-05 DIAGNOSIS — K559 Vascular disorder of intestine, unspecified: Secondary | ICD-10-CM

## 2022-11-05 DIAGNOSIS — R933 Abnormal findings on diagnostic imaging of other parts of digestive tract: Secondary | ICD-10-CM

## 2022-11-20 ENCOUNTER — Ambulatory Visit
Admission: RE | Admit: 2022-11-20 | Discharge: 2022-11-20 | Disposition: A | Discharge status: Home / Self Care | Payer: Medicare Other | Source: Ambulatory Visit | Attending: Gastroenterology | Admitting: Gastroenterology

## 2022-11-20 DIAGNOSIS — K559 Vascular disorder of intestine, unspecified: Secondary | ICD-10-CM | POA: Diagnosis present

## 2022-11-20 DIAGNOSIS — R933 Abnormal findings on diagnostic imaging of other parts of digestive tract: Secondary | ICD-10-CM

## 2022-11-20 MED ORDER — IOHEXOL 350 MG/ML SOLN
100.0000 mL | Freq: Once | INTRAVENOUS | Status: AC | PRN
  Administered 2022-11-20: 100 mL via INTRAVENOUS

## 2022-11-29 ENCOUNTER — Encounter: Payer: Self-pay | Admitting: Oncology

## 2022-11-29 ENCOUNTER — Inpatient Hospital Stay: Payer: Medicare Other | Attending: Oncology | Admitting: Oncology

## 2022-11-29 ENCOUNTER — Inpatient Hospital Stay: Payer: Medicare Other

## 2022-11-29 VITALS — BP 144/79 | HR 62 | Temp 96.9°F | Resp 16 | Wt 225.8 lb

## 2022-11-29 DIAGNOSIS — G4730 Sleep apnea, unspecified: Secondary | ICD-10-CM | POA: Insufficient documentation

## 2022-11-29 DIAGNOSIS — Z853 Personal history of malignant neoplasm of breast: Secondary | ICD-10-CM | POA: Insufficient documentation

## 2022-11-29 DIAGNOSIS — I10 Essential (primary) hypertension: Secondary | ICD-10-CM | POA: Insufficient documentation

## 2022-11-29 DIAGNOSIS — Z7901 Long term (current) use of anticoagulants: Secondary | ICD-10-CM | POA: Diagnosis not present

## 2022-11-29 DIAGNOSIS — F32A Depression, unspecified: Secondary | ICD-10-CM | POA: Insufficient documentation

## 2022-11-29 DIAGNOSIS — K6389 Other specified diseases of intestine: Secondary | ICD-10-CM | POA: Diagnosis not present

## 2022-11-29 DIAGNOSIS — M810 Age-related osteoporosis without current pathological fracture: Secondary | ICD-10-CM | POA: Diagnosis not present

## 2022-11-29 DIAGNOSIS — E785 Hyperlipidemia, unspecified: Secondary | ICD-10-CM | POA: Diagnosis not present

## 2022-11-29 DIAGNOSIS — Z79899 Other long term (current) drug therapy: Secondary | ICD-10-CM | POA: Insufficient documentation

## 2022-11-29 DIAGNOSIS — E876 Hypokalemia: Secondary | ICD-10-CM | POA: Diagnosis not present

## 2022-11-29 DIAGNOSIS — D372 Neoplasm of uncertain behavior of small intestine: Secondary | ICD-10-CM | POA: Diagnosis present

## 2022-11-29 LAB — CBC WITH DIFFERENTIAL/PLATELET
Abs Immature Granulocytes: 0.02 10*3/uL (ref 0.00–0.07)
Basophils Absolute: 0.1 10*3/uL (ref 0.0–0.1)
Basophils Relative: 1 %
Eosinophils Absolute: 0.2 10*3/uL (ref 0.0–0.5)
Eosinophils Relative: 4 %
HCT: 35.3 % — ABNORMAL LOW (ref 36.0–46.0)
Hemoglobin: 11.9 g/dL — ABNORMAL LOW (ref 12.0–15.0)
Immature Granulocytes: 0 %
Lymphocytes Relative: 20 %
Lymphs Abs: 0.9 10*3/uL (ref 0.7–4.0)
MCH: 28 pg (ref 26.0–34.0)
MCHC: 33.7 g/dL (ref 30.0–36.0)
MCV: 83.1 fL (ref 80.0–100.0)
Monocytes Absolute: 0.5 10*3/uL (ref 0.1–1.0)
Monocytes Relative: 10 %
Neutro Abs: 3 10*3/uL (ref 1.7–7.7)
Neutrophils Relative %: 65 %
Platelets: 194 10*3/uL (ref 150–400)
RBC: 4.25 MIL/uL (ref 3.87–5.11)
RDW: 14.2 % (ref 11.5–15.5)
WBC: 4.5 10*3/uL (ref 4.0–10.5)
nRBC: 0 % (ref 0.0–0.2)

## 2022-11-29 LAB — COMPREHENSIVE METABOLIC PANEL
ALT: 19 U/L (ref 0–44)
AST: 25 U/L (ref 15–41)
Albumin: 4 g/dL (ref 3.5–5.0)
Alkaline Phosphatase: 66 U/L (ref 38–126)
Anion gap: 10 (ref 5–15)
BUN: 12 mg/dL (ref 8–23)
CO2: 29 mmol/L (ref 22–32)
Calcium: 8.9 mg/dL (ref 8.9–10.3)
Chloride: 96 mmol/L — ABNORMAL LOW (ref 98–111)
Creatinine, Ser: 0.95 mg/dL (ref 0.44–1.00)
GFR, Estimated: >60 mL/min (ref >60)
Glucose, Bld: 109 mg/dL — ABNORMAL HIGH (ref 70–99)
Potassium: 3.1 mmol/L — ABNORMAL LOW (ref 3.5–5.1)
Sodium: 135 mmol/L (ref 135–145)
Total Bilirubin: 0.7 mg/dL (ref 0.3–1.2)
Total Protein: 7.5 g/dL (ref 6.5–8.1)

## 2022-11-29 LAB — LACTATE DEHYDROGENASE: LDH: 164 U/L (ref 98–192)

## 2022-11-29 NOTE — Progress Notes (Signed)
Patient is referred here by Octavia Bruckner for mass of small intestine.

## 2022-11-29 NOTE — Assessment & Plan Note (Signed)
Potassium is 3.  This is likely secondary to Lasix use.

## 2022-11-29 NOTE — Progress Notes (Signed)
Hematology/Oncology Consult note [phone] [phone]        REFERRING PROVIDER: Baxter Hire, MD   CHIEF COMPLAINTS/REASON FOR VISIT:  Evaluation of    ASSESSMENT & PLAN:   Small bowel mass Imaging results were reviewed and discussed with patient. Terminal ileum 2.4 cm mass as well as ascending colon mass to 3.2 cm. 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  Check chromogranin A, 5-HIAA [discussed with patient about food to avoid] Recommend dotatate PET scan for further evaluation. She has establish care with surgery.  Further recommendation pending above workup.  Hypokalemia Potassium is 3.  This is likely secondary to Lasix use.   Orders Placed This Encounter  Procedures   NM PET DOTATATE SKULL BASE TO MID THIGH    Standing Status:   Future    Standing Expiration Date:   11/30/2023    Order Specific Question:   If indicated for the ordered procedure, I authorize the administration of a radiopharmaceutical per Radiology protocol    Answer:   Yes    Order Specific Question:   Preferred imaging location?    Answer:   [facility]   Comprehensive metabolic panel    Standing Status:   Future    Number of Occurrences:   1    Standing Expiration Date:   11/30/2023   CBC with Differential/Platelet    Standing Status:   Future    Number of Occurrences:   1    Standing Expiration Date:   11/30/2023   Chromogranin A    Standing Status:   Future    Number of Occurrences:   1    Standing Expiration Date:   11/30/2023   Lactate dehydrogenase    Standing Status:   Future    Number of Occurrences:   1    Standing Expiration Date:   11/30/2023   Follow-up to be determined. All questions were answered. The patient knows to call the clinic with any problems, questions or concerns.  [name], MD, PhD [facility] Hematology Oncology 11/29/2022   HISTORY OF PRESENTING ILLNESS:   Dominique Moore is a  82 y.o.  female with PMH listed below was seen in consultation at the  request of  Baxter Hire, MD  for evaluation of small bowel mass  12/28 - 10/21/2022, patient was admitted due to right abdominal pain 10/17/2022 CT chest angiogram showed no evidence of PE.  Bibasilar hypoventilatory changes and a trace bilateral effusions.  No acute airspace disease.  Spiculated soft tissue mass along the serosal surface medial aspect ascending colon 2.7 cm.  Mild peripancreatic fat stranding consistent with acute uncomplicated pancreatitis.  Sigmoid diverticulosis without typhlitis.  Small hiatal hernia. 10/17/2022 CT abdomen pelvis with contrast showed 1. Spiculated soft tissue mass along the serosal surface medial aspect ascending colon, measuring up to 2.7 cm. Differential diagnosis includes primary colonic malignancy, metastatic disease,or gastrointestinal stromal tumor. Further evaluation with PET-CT is recommended. 2. Mild peripancreatic fat stranding consistent with acute uncomplicated pancreatitis. Diffuse fatty infiltration of the pancreas.3. Sigmoid diverticulosis without diverticulitis. 4. Small hiatal hernia. 5.  Aortic Atherosclerosi  GI was consulted during the admission.  She was seen by Dr. Marius Ditch, colonoscopy showed evidence of right-sided ischemic colitis.  No evidence of mass lesion in the entire examined colon.  Dr. Marius Ditch was not able to intubate the terminal ileum due to significant looping and body habitus.  Patient establish care with outpatient gastroenterology and was seen by Dr. Ricky Stabs team.  11/20/2022, CT angio abdomen  pelvis with or without contrast 1. 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 2. Minimal narrowing at the origin of the superior mesenteric artery due to noncalcified atheromatous plaque. 3. 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 4. Persistent thickening of the wall of the proximal ascending colon consistent with biopsy-proven ischemic colitis. The mesenteric mass appears to be parasitizing the arterial flow from the right colic artery, likely causing ischemia of the downstream proximal ascending colon. 5. Mild colonic diverticulosis without evidence of acute diverticulitis.  Patient was referred to establish care with oncology for further evaluation.  Today she is accompanied by her husband. She denies any skin flushing or diarrhea.  Right-sided abdominal pain has completely resolved. Denies any unintentional weight loss, night sweats or fever.  Denies wheezing.  MEDICAL HISTORY:  Past Medical History:  Diagnosis Date   Breast cancer ([place]) 2000   left breast lumpectomy with rad tx   Complication of anesthesia    Depression    Hyperlipidemia    Hypertension    Osteoporosis    Personal history of radiation therapy    PONV (postoperative nausea and vomiting)    Pre-diabetes    Sleep apnea    no CPAP    SURGICAL HISTORY: Past Surgical History:  Procedure Laterality Date   BREAST BIOPSY Left 2000   positive   BREAST LUMPECTOMY     CATARACT EXTRACTION Bilateral    COLONOSCOPY WITH PROPOFOL N/A 02/22/2016   Procedure: COLONOSCOPY WITH PROPOFOL;  Surgeon: Manya Silvas, MD;  Location: [facility] ENDOSCOPY;  Service: Endoscopy;  Laterality: N/A;   COLONOSCOPY WITH PROPOFOL N/A 10/21/2022   Procedure: COLONOSCOPY WITH PROPOFOL;  Surgeon: Lin Landsman, MD;  Location: [facility] ENDOSCOPY;  Service: Gastroenterology;  Laterality: N/A;   EYE SURGERY     bilateral catracts   JOINT REPLACEMENT     KNEE ARTHROPLASTY Right 08/27/2017   Procedure: COMPUTER ASSISTED TOTAL KNEE ARTHROPLASTY;  Surgeon: Dereck Leep, MD;  Location: [facility] ORS;  Service: Orthopedics;   Laterality: Right;   KNEE ARTHROPLASTY Left 09/20/2020   Procedure: COMPUTER ASSISTED TOTAL KNEE ARTHROPLASTY;  Surgeon: Dereck Leep, MD;  Location: [facility] ORS;  Service: Orthopedics;  Laterality: Left;    SOCIAL HISTORY: Social History   Socioeconomic History   Marital status: Married    Spouse name: Rask   Number of children: Not on file   Years of education: Not on file   Highest education level: Not on file  Occupational History   Not on file  Tobacco Use   Smoking status: Never   Smokeless tobacco: Never  Vaping Use   Vaping Use: Never used  Substance and Sexual Activity   Alcohol use: No   Drug use: No   Sexual activity: Not on file  Other Topics Concern   Not on file  Social History Narrative   Lives with spouse at home    Social Determinants of Health   Financial Resource Strain: Low Risk  (11/29/2022)   Overall Financial Resource Strain (CARDIA)    Difficulty of Paying Living Expenses: Not hard at all  Food Insecurity: No Food Insecurity (10/18/2022)   Hunger Vital Sign  Worried About Charity fundraiser in the Last Year: Never true    [place] in the Last Year: Never true  Transportation Needs: No Transportation Needs (10/18/2022)   PRAPARE - Hydrologist (Medical): No    Lack of Transportation (Non-Medical): No  Physical Activity: Not on file  Stress: No Stress Concern Present (11/29/2022)   [place]    Feeling of Stress : Not at all  Social Connections: Not on file  Intimate Partner Violence: Not At Risk (10/18/2022)   Humiliation, Afraid, Rape, and Kick questionnaire    Fear of Current or Ex-Partner: No    Emotionally Abused: No    Physically Abused: No    Sexually Abused: No    FAMILY HISTORY: Family History  Problem Relation Age of Onset   Breast cancer Mother 63    ALLERGIES:  is allergic to ace inhibitors, avelox [moxifloxacin],  codeine, and penicillins.  MEDICATIONS:  Current Outpatient Medications  Medication Sig Dispense Refill   amiodarone (PACERONE) 200 MG tablet Take 1 tablet (200 mg total) by mouth 2 (two) times daily. Hold until you see your cardiologist     amLODipine (NORVASC) 10 MG tablet Take 10 mg by mouth daily.      apixaban (ELIQUIS) 5 MG TABS tablet Take 1 tablet (5 mg total) by mouth 2 (two) times daily. Restart from Thursday 60 tablet    atenolol (TENORMIN) 25 MG tablet Take 25 mg by mouth daily.      citalopram (CELEXA) 20 MG tablet Take 20 mg by mouth daily.     clotrimazole (LOTRIMIN) 1 % cream Apply 1 application topically as needed (rash on hands).      doxazosin (CARDURA) 4 MG tablet Take 4 mg by mouth daily.     furosemide (LASIX) 20 MG tablet Take 20 mg by mouth daily.     losartan (COZAAR) 100 MG tablet Take 100 mg by mouth daily.     potassium chloride SA (KLOR-CON M) 20 MEQ tablet Take 20 mEq by mouth daily.     pravastatin (PRAVACHOL) 20 MG tablet Take 20 mg by mouth at bedtime.     No current facility-administered medications for this visit.    Review of Systems  Constitutional:  Negative for appetite change, chills, fatigue and fever.  HENT:   Negative for hearing loss and voice change.   Eyes:  Negative for eye problems.  Respiratory:  Negative for chest tightness and cough.   Cardiovascular:  Negative for chest pain.  Gastrointestinal:  Negative for abdominal distention, abdominal pain and blood in stool.  Endocrine: Negative for hot flashes.  Genitourinary:  Negative for difficulty urinating and frequency.   Musculoskeletal:  Negative for arthralgias.  Skin:  Negative for itching and rash.  Neurological:  Negative for extremity weakness.  Hematological:  Negative for adenopathy.  Psychiatric/Behavioral:  Negative for confusion.    PHYSICAL EXAMINATION: ECOG PERFORMANCE STATUS: 1 - Symptomatic but completely ambulatory Vitals:   11/29/22 1218  BP: (!) 144/79  Pulse:  62  Resp: 16  Temp: (!) 96.9 F (36.1 C)  SpO2: 95%   Filed Weights   11/29/22 1218  Weight: 225 lb 12.8 oz (102.4 kg)    Physical Exam Constitutional:      Appearance: She is obese.     LABORATORY DATA:  I have reviewed the data as listed    Latest Ref Rng & Units 11/29/2022  12:56 PM 10/19/2022    4:01 PM 10/17/2022    4:10 PM  CBC  WBC 4.0 - 10.5 K/uL 4.5  15.4  12.1   Hemoglobin 12.0 - 15.0 g/dL 11.9  11.4  12.1   Hematocrit 36.0 - 46.0 % 35.3  35.3  36.8   Platelets 150 - 400 K/uL 194  187  189       Latest Ref Rng & Units 11/29/2022   12:56 PM 10/19/2022    4:13 AM 10/18/2022    3:04 PM  CMP  Glucose 70 - 99 mg/dL 109  164    BUN 8 - 23 mg/dL 12  21    Creatinine 0.44 - 1.00 mg/dL 0.95  0.84    Sodium 135 - 145 mmol/L 135  141    Potassium 3.5 - 5.1 mmol/L 3.1  4.0  3.0   Chloride 98 - 111 mmol/L 96  107    CO2 22 - 32 mmol/L 29  28    Calcium 8.9 - 10.3 mg/dL 8.9  8.6    Total Protein 6.5 - 8.1 g/dL 7.5     Total Bilirubin 0.3 - 1.2 mg/dL 0.7     Alkaline Phos 38 - 126 U/L 66     AST 15 - 41 U/L 25     ALT 0 - 44 U/L 19         RADIOGRAPHIC STUDIES: I have personally reviewed the radiological images as listed and agreed with the findings in the report. CT Angio Abd/Pel w/ and/or w/o  Result Date: 11/20/2022 CLINICAL DATA:  Ischemic colitis. Spiculated soft tissue mass noted adjacent ascending colon on prior CT. Colonoscopy performed on 10/21/2022 shows continuous area of nonbleeding ulcerated mucosa in the proximal ascending colon consistent with ischemic colitis. Biopsies obtained at the time of colonoscopy shows ischemic colitis and ascending colon. EXAM: CTA ABDOMEN AND PELVIS WITHOUT AND WITH CONTRAST TECHNIQUE: Multidetector CT imaging of the abdomen and pelvis was performed using the standard protocol during bolus administration of intravenous contrast. Multiplanar reconstructed images and MIPs were obtained and reviewed to evaluate the vascular  anatomy. RADIATION DOSE REDUCTION: This exam was performed according to the departmental dose-optimization program which includes automated exposure control, adjustment of the mA and/or kV according to patient size and/or use of iterative reconstruction technique. CONTRAST:  127m OMNIPAQUE IOHEXOL 350 MG/ML SOLN COMPARISON:  CT abdomen pelvis 10/17/2022 FINDINGS: VASCULAR Aorta: Minimal scattered calcified atheromatous plaque without aneurysm or flow-limiting stenosis. 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 Splenic, left gastric and common hepatic arteries are patent. SMA: Minimal narrowing at the origin of the superior mesenteric artery due to noncalcified atheromatous plaque. The superior mesenteric artery is otherwise patent. The right colic artery is patent. The right lower quadrant mesenteric mass appears to be parasitizing significant amounts of the arterial blood flow from the right colic artery likely causing ischemia of the downstream segment of ascending colon. Renals: Calcified atheromatous plaque at the origin of the right main renal artery without flow-limiting stenosis. Accessory right renal artery supplying the anterior lower pole is patent without significant stenosis. No significant narrowing of the left main renal artery. Accessory left renal artery supplying the lower pole is also patent without significant narrowing. IMA: Patent without evidence of aneurysm, dissection, vasculitis or significant stenosis. Inflow: Patent without evidence of aneurysm, dissection, vasculitis or significant stenosis. Proximal Outflow: Bilateral common femoral and visualized portions of the superficial and  profunda femoral arteries are patent without evidence of aneurysm, dissection, vasculitis or significant stenosis. Veins: No significant abnormality of the hepatic, portal, superior mesenteric veins. Review of the MIP images confirms the  above findings. NON-VASCULAR Lower chest: Calcified right lung base nodules likely related to prior granulomatous inflammation. No acute abnormality of the visualized lung bases. Hepatobiliary: Subcentimeter hypodensity in the posterior aspect of the right hepatic lobe (image 44, series 12) is too small to fully characterize. It is not significantly changed since the prior exam. Liver, gallbladder common bile ducts are otherwise unremarkable. Pancreas: Fatty atrophy of the pancreatic body and head. Pancreas otherwise normal. Spleen: Normal in size without focal abnormality. Adrenals/Urinary Tract: Adrenal glands are unremarkable. Kidneys are normal, without renal calculi, focal lesion, or hydronephrosis. Tiny focus of air noted within the bladder lumen likely due to recent catheterization. Bladder otherwise normal. Stomach/Bowel: No bowel dilatation to indicate ileus or obstruction. Sigmoid colon is markedly redundant. Mild diverticulosis of the descending and sigmoid colon without evidence of acute diverticulitis. Persistent thickening of the wall of the proximal ascending colon best seen on images 118-129 of series 12. There is a mildly hyperenhancing mass within the terminal ileum measuring 2.4 x 1.8 x 1.7 cm, best seen on image 129 of series 12 and image 44 of series 15. Previously seen right lower quadrant mass abutting the ascending colon is not significantly changed in size measuring 3.2 x 2.3 x 2.2 Cm on the current examination compared to 2.7 x 2.3 x 2.1 cm on the prior study. Appendix is unremarkable. Lymphatic: No enlarged abdominal or pelvic lymph nodes. Reproductive: Uterus and bilateral adnexa are unremarkable. Other: Small fat containing umbilical hernia. Musculoskeletal: No acute or significant osseous findings. IMPRESSION: 1. 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 2. Minimal narrowing at the origin of the superior mesenteric  artery due to noncalcified atheromatous plaque. 3. 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 4. Persistent thickening of the wall of the proximal ascending colon consistent with biopsy-proven ischemic colitis. The mesenteric mass appears to be parasitizing the arterial flow from the right colic artery, likely causing ischemia of the downstream proximal ascending colon. 5. Mild colonic diverticulosis without evidence of acute diverticulitis. Electronically Signed   By: Miachel Roux M.D.   On: 11/20/2022 15:17   DG Abd 1 View  Result Date: 10/19/2022 CLINICAL DATA:  Abdominal pain EXAM: ABDOMEN - 1 VIEW COMPARISON:  None Available. FINDINGS: Gaseous distention of colon noted consistent with ileus versus distal obstruction. No organomegaly. No radiopaque calculi. IMPRESSION: Distention consistent with ileus or distal obstruction Colonic gaseous Electronically Signed   By: Sammie Bench M.D.   On: 10/19/2022 18:32   CT Angio Chest PE W/Cm &/Or Wo Cm  Result Date: 10/17/2022 CLINICAL DATA:  Right-sided back pain radiating to right flank and inguinal region, nausea EXAM: CT ANGIOGRAPHY CHEST CT ABDOMEN AND PELVIS WITH CONTRAST TECHNIQUE: Multidetector CT imaging of the chest was performed using the standard protocol during bolus administration of intravenous contrast. Multiplanar CT image reconstructions and MIPs were obtained to evaluate the vascular anatomy. Multidetector CT imaging of the abdomen and pelvis was performed using the standard protocol during bolus administration of  intravenous contrast. RADIATION DOSE REDUCTION: This exam was performed according to the departmental dose-optimization program which includes  automated exposure control, adjustment of the mA and/or kV according to patient size and/or use of iterative reconstruction technique. CONTRAST:  111m OMNIPAQUE IOHEXOL 350 MG/ML SOLN COMPARISON:  10/09/2022 FINDINGS: CTA CHEST FINDINGS Cardiovascular: This is a technically adequate evaluation of the pulmonary vasculature. No filling defects or pulmonary emboli. The heart is enlarged without pericardial effusion. No evidence of thoracic aortic aneurysm or dissection. Atherosclerosis of the aorta and coronary vasculature. Mediastinum/Nodes: No enlarged mediastinal, hilar, or axillary lymph nodes. Thyroid gland, trachea, and esophagus demonstrate no significant findings. Small hiatal hernia. Lungs/Pleura: Hypoventilatory changes are seen at the lung bases. Trace bilateral pleural effusions. No airspace disease or pneumothorax. Central airways are patent. Musculoskeletal: No acute or destructive bony lesions. Reconstructed images demonstrate no additional findings. Review of the MIP images confirms the above findings. CT ABDOMEN and PELVIS FINDINGS Hepatobiliary: No focal liver abnormality is seen. No gallstones, gallbladder wall thickening, or biliary dilatation. Pancreas: Diffuse fatty infiltration of the pancreas. There is some mild fat stranding along the ventral aspect of the pancreatic body consistent with acute pancreatitis. No fluid collection, pseudocyst, or abscess. Spleen: Normal in size without focal abnormality. Adrenals/Urinary Tract: Adrenal glands are unremarkable. Kidneys are normal, without renal calculi, focal lesion, or hydronephrosis. Bladder is unremarkable. Stomach/Bowel: No bowel obstruction or ileus. Scattered colonic diverticulosis without diverticulitis. Normal appendix right lower quadrant. A spiculated soft tissue mass identified along the medial aspect of the ascending colon, measuring 2.3 x 2.7 x 2.1 cm reference image 41/5. Findings are highly concerning for malignancy, including  primary colonic malignancy, metastatic disease, or gastrointestinal stromal tumor. There is loss of normal fat plane between this mass and the serosal surface of the ascending colon. Further evaluation with PET CT is recommended. Vascular/Lymphatic: Aortic atherosclerosis. No enlarged abdominal or pelvic lymph nodes. Reproductive: Uterus and bilateral adnexa are unremarkable. Other: No free fluid or free intraperitoneal gas. Small fat containing umbilical hernia. Musculoskeletal: No acute or destructive bony lesions. Reconstructed images demonstrate no additional findings. Review of the MIP images confirms the above findings. IMPRESSION: Chest: 1. No evidence of pulmonary embolus. 2. Bibasilar hypoventilatory changes and trace bilateral effusions. No acute airspace disease. 3. Aortic Atherosclerosis (ICD10-I70.0). Coronary artery atherosclerosis Abdomen/pelvis: 1. Spiculated soft tissue mass along the serosal surface medial aspect ascending colon, measuring up to 2.7 cm. Differential diagnosis includes primary colonic malignancy, metastatic disease, or gastrointestinal stromal tumor. Further evaluation with PET-CT is recommended. 2. Mild peripancreatic fat stranding consistent with acute uncomplicated pancreatitis. Diffuse fatty infiltration of the pancreas. 3. Sigmoid diverticulosis without diverticulitis. 4. Small hiatal hernia. 5.  Aortic Atherosclerosis (ICD10-I70.0). Electronically Signed   By: MRanda NgoM.D.   On: 10/17/2022 21:00   CT Abdomen Pelvis W Contrast  Result Date: 10/17/2022 CLINICAL DATA:  Right-sided back pain radiating to right flank and inguinal region, nausea EXAM: CT ANGIOGRAPHY CHEST CT ABDOMEN AND PELVIS WITH CONTRAST TECHNIQUE: Multidetector CT imaging of the chest was performed using the standard protocol during bolus administration of intravenous contrast. Multiplanar CT image reconstructions and MIPs were obtained to evaluate the vascular anatomy. Multidetector CT imaging of  the abdomen and pelvis was performed using the standard protocol during bolus administration of intravenous contrast. RADIATION DOSE REDUCTION: This exam was performed according to the departmental dose-optimization program which includes automated exposure control, adjustment of the mA and/or kV according to patient size and/or use of iterative reconstruction technique. CONTRAST:  1039m  OMNIPAQUE IOHEXOL 350 MG/ML SOLN COMPARISON:  10/09/2022 FINDINGS: CTA CHEST FINDINGS Cardiovascular: This is a technically adequate evaluation of the pulmonary vasculature. No filling defects or pulmonary emboli. The heart is enlarged without pericardial effusion. No evidence of thoracic aortic aneurysm or dissection. Atherosclerosis of the aorta and coronary vasculature. Mediastinum/Nodes: No enlarged mediastinal, hilar, or axillary lymph nodes. Thyroid gland, trachea, and esophagus demonstrate no significant findings. Small hiatal hernia. Lungs/Pleura: Hypoventilatory changes are seen at the lung bases. Trace bilateral pleural effusions. No airspace disease or pneumothorax. Central airways are patent. Musculoskeletal: No acute or destructive bony lesions. Reconstructed images demonstrate no additional findings. Review of the MIP images confirms the above findings. CT ABDOMEN and PELVIS FINDINGS Hepatobiliary: No focal liver abnormality is seen. No gallstones, gallbladder wall thickening, or biliary dilatation. Pancreas: Diffuse fatty infiltration of the pancreas. There is some mild fat stranding along the ventral aspect of the pancreatic body consistent with acute pancreatitis. No fluid collection, pseudocyst, or abscess. Spleen: Normal in size without focal abnormality. Adrenals/Urinary Tract: Adrenal glands are unremarkable. Kidneys are normal, without renal calculi, focal lesion, or hydronephrosis. Bladder is unremarkable. Stomach/Bowel: No bowel obstruction or ileus. Scattered colonic diverticulosis without diverticulitis.  Normal appendix right lower quadrant. A spiculated soft tissue mass identified along the medial aspect of the ascending colon, measuring 2.3 x 2.7 x 2.1 cm reference image 41/5. Findings are highly concerning for malignancy, including primary colonic malignancy, metastatic disease, or gastrointestinal stromal tumor. There is loss of normal fat plane between this mass and the serosal surface of the ascending colon. Further evaluation with PET CT is recommended. Vascular/Lymphatic: Aortic atherosclerosis. No enlarged abdominal or pelvic lymph nodes. Reproductive: Uterus and bilateral adnexa are unremarkable. Other: No free fluid or free intraperitoneal gas. Small fat containing umbilical hernia. Musculoskeletal: No acute or destructive bony lesions. Reconstructed images demonstrate no additional findings. Review of the MIP images confirms the above findings. IMPRESSION: Chest: 1. No evidence of pulmonary embolus. 2. Bibasilar hypoventilatory changes and trace bilateral effusions. No acute airspace disease. 3. Aortic Atherosclerosis (ICD10-I70.0). Coronary artery atherosclerosis Abdomen/pelvis: 1. Spiculated soft tissue mass along the serosal surface medial aspect ascending colon, measuring up to 2.7 cm. Differential diagnosis includes primary colonic malignancy, metastatic disease, or gastrointestinal stromal tumor. Further evaluation with PET-CT is recommended. 2. Mild peripancreatic fat stranding consistent with acute uncomplicated pancreatitis. Diffuse fatty infiltration of the pancreas. 3. Sigmoid diverticulosis without diverticulitis. 4. Small hiatal hernia. 5.  Aortic Atherosclerosis (ICD10-I70.0). Electronically Signed   By: Randa Ngo M.D.   On: 10/17/2022 21:00   CT Renal Stone Study  Result Date: 10/17/2022 CLINICAL DATA:  Abdominal pain, flank pain EXAM: CT ABDOMEN AND PELVIS WITHOUT CONTRAST TECHNIQUE: Multidetector CT imaging of the abdomen and pelvis was performed following the standard protocol  without IV contrast. RADIATION DOSE REDUCTION: This exam was performed according to the departmental dose-optimization program which includes automated exposure control, adjustment of the mA and/or kV according to patient size and/or use of iterative reconstruction technique. COMPARISON:  None Available. FINDINGS: Lower chest: There are small linear patchy infiltrates in the lower lung fields, more so on the right side. Heart is enlarged in size. Minimal right pleural effusion is seen. Hepatobiliary: No focal abnormalities are seen. Anterior bulging is seen in the left lobe of liver, possibly within ventral hernia. There is no dilation of bile ducts. Gallbladder is unremarkable. Pancreas: There is fatty infiltration in the pancreas, more so in the head. There is minimal stranding in the fat planes adjacent to  head of the pancreas. There are no loculated fluid collections in or around the pancreas. Spleen: Unremarkable. Adrenals/Urinary Tract: Adrenals are unremarkable. There is no hydronephrosis. There are no renal or ureteral stones. Urinary bladder is not distended. Stomach/Bowel: Small hiatal hernia is seen. Stomach is unremarkable. Small bowel loops are not dilated. Appendix is difficult to visualize. In image 62 of series 5, there is a small caliber tubular structure inseparable from tip of cecum, possibly normal appendix. There is no focal pericecal inflammation. Multiple diverticula are seen in colon without focal acute diverticulitis. Vascular/Lymphatic: Scattered arterial calcifications are seen. In image 33 of series 2, there is 2.5 x 2.3 cm nodular density adjacent to the medial margin of ascending colon. There are few subcentimeter nodes in retroperitoneum. Reproductive: Unremarkable. Other: There is no ascites or pneumoperitoneum. Umbilical hernia containing fat is seen. Musculoskeletal: Degenerative changes are noted in lumbar spine. IMPRESSION: There is no evidence of intestinal obstruction or  pneumoperitoneum. There is no hydronephrosis. Appendix is not dilated. There is 2.5 cm smooth marginated soft tissue density lesion adjacent to the medial margin of ascending colon. Possibility of metastatic lymphadenopathy is not excluded. Short-term follow-up CT abdomen and pelvis in 3 months along with PET-CT if warranted should be considered. If the patient has any known primary malignancy, immediate further evaluation with PET-CT may be considered. There is minimal stranding in fat planes adjacent to the head of the pancreas which may be a normal variation or suggest early changes of acute pancreatitis. Please correlate with laboratory findings. There is no loculated fluid collection in or around the pancreas. Small hiatal hernia. Increased markings in the lower lung fields, more so on the right side may suggest scarring or atelectasis/pneumonia. Minimal right pleural effusion. Diverticulosis of colon without signs of focal diverticulitis. Other findings as described in the body of the report. Electronically Signed   By: Elmer Picker M.D.   On: 10/17/2022 16:49

## 2022-11-29 NOTE — Assessment & Plan Note (Signed)
Imaging results were reviewed and discussed with patient. Terminal ileum 2.4 cm mass as well as ascending colon mass to 3.2 cm. 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  Check chromogranin A, 5-HIAA [discussed with patient about food to avoid] Recommend dotatate PET scan for further evaluation. She has establish care with surgery.  Further recommendation pending above workup.

## 2022-12-02 ENCOUNTER — Other Ambulatory Visit: Payer: Self-pay

## 2022-12-02 ENCOUNTER — Other Ambulatory Visit: Payer: Self-pay | Admitting: *Deleted

## 2022-12-02 DIAGNOSIS — D372 Neoplasm of uncertain behavior of small intestine: Secondary | ICD-10-CM | POA: Diagnosis not present

## 2022-12-02 DIAGNOSIS — K6389 Other specified diseases of intestine: Secondary | ICD-10-CM

## 2022-12-02 DIAGNOSIS — E876 Hypokalemia: Secondary | ICD-10-CM

## 2022-12-02 LAB — CHROMOGRANIN A: Chromogranin A (ng/mL): 64.8 ng/mL (ref 0.0–101.8)

## 2022-12-04 LAB — 5 HIAA, QUANTITATIVE, URINE, 24 HOUR
5-HIAA, Ur: 4.3 mg/L
5-HIAA,Quant.,24 Hr Urine: 6.5 mg/24 hr (ref 0.0–14.9)
Total Volume: 1500

## 2022-12-11 ENCOUNTER — Inpatient Hospital Stay: Payer: Medicare Other

## 2022-12-17 ENCOUNTER — Ambulatory Visit
Admission: RE | Admit: 2022-12-17 | Discharge: 2022-12-17 | Disposition: A | Discharge status: Home / Self Care | Payer: Medicare Other | Source: Ambulatory Visit | Attending: Oncology | Admitting: Oncology

## 2022-12-17 DIAGNOSIS — K6389 Other specified diseases of intestine: Secondary | ICD-10-CM | POA: Diagnosis present

## 2022-12-17 DIAGNOSIS — C7B8 Other secondary neuroendocrine tumors: Secondary | ICD-10-CM | POA: Diagnosis not present

## 2022-12-17 DIAGNOSIS — R918 Other nonspecific abnormal finding of lung field: Secondary | ICD-10-CM | POA: Insufficient documentation

## 2022-12-17 MED ORDER — COPPER CU 64 DOTATATE 1 MCI/ML IV SOLN
4.0000 | Freq: Once | INTRAVENOUS | Status: AC
  Administered 2022-12-17: 4.42 via INTRAVENOUS

## 2022-12-27 ENCOUNTER — Encounter: Payer: Self-pay | Admitting: Oncology

## 2022-12-27 ENCOUNTER — Telehealth: Payer: Self-pay

## 2022-12-27 ENCOUNTER — Inpatient Hospital Stay: Payer: Medicare Other | Attending: Oncology | Admitting: Oncology

## 2022-12-27 VITALS — BP 141/64 | HR 50 | Temp 97.2°F | Resp 18 | Wt 229.6 lb

## 2022-12-27 DIAGNOSIS — Z79899 Other long term (current) drug therapy: Secondary | ICD-10-CM | POA: Diagnosis not present

## 2022-12-27 DIAGNOSIS — Z853 Personal history of malignant neoplasm of breast: Secondary | ICD-10-CM | POA: Insufficient documentation

## 2022-12-27 DIAGNOSIS — R918 Other nonspecific abnormal finding of lung field: Secondary | ICD-10-CM | POA: Insufficient documentation

## 2022-12-27 DIAGNOSIS — Z7189 Other specified counseling: Secondary | ICD-10-CM

## 2022-12-27 DIAGNOSIS — Z7901 Long term (current) use of anticoagulants: Secondary | ICD-10-CM | POA: Insufficient documentation

## 2022-12-27 DIAGNOSIS — I10 Essential (primary) hypertension: Secondary | ICD-10-CM | POA: Insufficient documentation

## 2022-12-27 DIAGNOSIS — E785 Hyperlipidemia, unspecified: Secondary | ICD-10-CM | POA: Insufficient documentation

## 2022-12-27 DIAGNOSIS — K449 Diaphragmatic hernia without obstruction or gangrene: Secondary | ICD-10-CM | POA: Insufficient documentation

## 2022-12-27 DIAGNOSIS — K6389 Other specified diseases of intestine: Secondary | ICD-10-CM | POA: Insufficient documentation

## 2022-12-27 NOTE — Assessment & Plan Note (Signed)
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 Small lung nodules, favor benign.  Normal 5-HIAA and chromogranin A.   Recommend small bowel resection. Discussed with Dr.Cintron over the phone. His office will reach out to patient to further evaluation. Patient will return to clinic a few weeks after surgery to review pathology reports.

## 2022-12-27 NOTE — Progress Notes (Signed)
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 Small lung nodules, favor benign.  Normal 5-HIAA and chromogranin A.   Recommend small bowel resection. Discussed with Dr.Cintron over the phone. His office will reach out to patient to further evaluation. Patient will return to clinic a few weeks after surgery to review pathology reports.    No orders of the defined types were placed in this encounter.  Follow-up to be determined. All questions were answered. The patient knows to call the clinic with any problems, questions or concerns.  [name], MD, PhD [facility] Hematology Oncology 12/27/2022   HISTORY OF PRESENTING ILLNESS:   Dominique Moore is a  82 y.o.  female with PMH listed below was seen in consultation at the request of  Baxter Hire, MD  for evaluation of small bowel mass  12/28 - 10/21/2022, patient was admitted due to right abdominal pain 10/17/2022 CT chest angiogram showed no evidence of PE.  Bibasilar hypoventilatory changes and a trace bilateral effusions.  No acute airspace disease.  Spiculated soft tissue mass along the serosal surface medial aspect ascending colon 2.7 cm.  Mild peripancreatic fat stranding consistent with acute uncomplicated pancreatitis.  Sigmoid diverticulosis without typhlitis.  Small hiatal hernia. 10/17/2022 CT abdomen pelvis with contrast showed 1. Spiculated soft tissue mass along the serosal surface medial aspect ascending colon, measuring up to 2.7 cm. Differential diagnosis includes primary colonic malignancy, metastatic disease,or gastrointestinal stromal tumor. Further evaluation with PET-CT is  recommended. 2. Mild peripancreatic fat stranding consistent with acute uncomplicated pancreatitis. Diffuse fatty infiltration of the pancreas.3. Sigmoid diverticulosis without diverticulitis. 4. Small hiatal hernia. 5.  Aortic Atherosclerosi  GI was consulted during the admission.  She was seen by Dr. Marius Ditch, colonoscopy showed evidence of right-sided ischemic colitis.  No evidence of mass lesion in the entire examined colon.  Dr. Marius Ditch was not able to intubate the terminal ileum due to significant looping and body habitus.  Patient establish care with outpatient gastroenterology and was seen by Dr. Ricky Stabs team.  11/20/2022, CT angio abdomen pelvis with or without contrast 1. 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 2. Minimal narrowing at the origin of the superior mesenteric artery due to noncalcified atheromatous plaque. 3. 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 4. Persistent thickening of the wall of the proximal ascending colon consistent with biopsy-proven ischemic colitis. The mesenteric mass appears to be parasitizing the arterial flow from the right colic artery, likely causing ischemia of the downstream proximal ascending colon. 5. Mild colonic diverticulosis without evidence of acute diverticulitis.  Patient was referred to establish care with oncology for further evaluation.  Today she is accompanied by her husband. She denies any skin flushing or diarrhea.  Right-sided abdominal pain has completely resolved. Denies any unintentional weight loss, night sweats or fever.  Denies wheezing.     INTERVAL HISTORY Dominique Moore  is a 82 y.o. female who has above history reviewed by me today presents for follow up visit for small bowel mass.  She has had DOTATATE PET scan done and presents to discuss results.   12/17/2022 DOTATAE PET showed  1. Intense radiotracer activity associated with a rounded soft tissue mass in the distal ileum/terminal ileum consistent well differentiated neuroendocrine tumor. Favor small bowel primary neuroendocrine tumor. 2. Local neuroendocrine tumor nodal metastasis to the ileocecal mesentery. 3. No evidence of additional metastatic adenopathy in the abdomen pelvis. 4. No evidence of liver metastasis. 5. No evidence of distant metastatic disease. 6. Several small RIGHT lung nodules are indeterminate but favored unrelated to neuroendocrine tumor  And favored benign  She has no new complaints.   MEDICAL HISTORY:  Past Medical History:  Diagnosis Date   Breast cancer ([place]) 2000   left breast lumpectomy with rad tx   Complication of anesthesia    Depression    Hyperlipidemia    Hypertension    Osteoporosis    Personal history of radiation therapy    PONV (postoperative nausea and vomiting)    Pre-diabetes    Sleep apnea    no CPAP    SURGICAL HISTORY: Past Surgical History:  Procedure Laterality Date   BREAST BIOPSY Left 2000   positive   BREAST LUMPECTOMY     CATARACT EXTRACTION Bilateral    COLONOSCOPY WITH PROPOFOL N/A 02/22/2016   Procedure: COLONOSCOPY WITH PROPOFOL;  Surgeon: Manya Silvas, MD;  Location: [facility] ENDOSCOPY;  Service: Endoscopy;  Laterality: N/A;   COLONOSCOPY WITH PROPOFOL N/A 10/21/2022   Procedure: COLONOSCOPY WITH PROPOFOL;  Surgeon: Lin Landsman, MD;  Location: [facility] ENDOSCOPY;  Service: Gastroenterology;  Laterality: N/A;   EYE SURGERY     bilateral catracts   JOINT REPLACEMENT     KNEE ARTHROPLASTY Right 08/27/2017   Procedure: COMPUTER ASSISTED TOTAL KNEE ARTHROPLASTY;  Surgeon: Dereck Leep, MD;  Location: [facility] ORS;  Service: Orthopedics;   Laterality: Right;   KNEE ARTHROPLASTY Left 09/20/2020   Procedure: COMPUTER ASSISTED TOTAL KNEE ARTHROPLASTY;  Surgeon: Dereck Leep, MD;  Location: [facility] ORS;  Service: Orthopedics;  Laterality: Left;    SOCIAL HISTORY: Social History   Socioeconomic History   Marital status: Married    Spouse name: Douthat   Number of children: Not on file   Years of education: Not on file   Highest education level: Not on file  Occupational History   Not on file  Tobacco Use   Smoking status: Never   Smokeless tobacco: Never  Vaping Use   Vaping Use: Never used  Substance and Sexual Activity   Alcohol use: No   Drug use: No   Sexual activity: Not on file  Other Topics Concern   Not on file  Social History Narrative   Lives with spouse at home    Social Determinants of Health   Financial Resource Strain: Low Risk  (11/29/2022)   Overall Financial Resource Strain (CARDIA)    Difficulty of Paying Living Expenses: Not hard at all  Food Insecurity: No Food Insecurity (10/18/2022)   Hunger Vital Sign    Worried About Running Out of Food in the Last Year: Never true    [place] in the Last Year: Never true  Transportation Needs: No Transportation Needs (10/18/2022)   PRAPARE - Hydrologist (Medical): No    Lack of Transportation (Non-Medical): No  Physical Activity: Not on  file  Stress: No Stress Concern Present (11/29/2022)   [place]    Feeling of Stress : Not at all  Social Connections: Not on file  Intimate Partner Violence: Not At Risk (10/18/2022)   Humiliation, Afraid, Rape, and Kick questionnaire    Fear of Current or Ex-Partner: No    Emotionally Abused: No    Physically Abused: No    Sexually Abused: No    FAMILY HISTORY: Family History  Problem Relation Age of Onset   Breast cancer Mother 17    ALLERGIES:  is allergic to ace inhibitors, avelox [moxifloxacin],  codeine, and penicillins.  MEDICATIONS:  Current Outpatient Medications  Medication Sig Dispense Refill   amiodarone (PACERONE) 200 MG tablet Take 1 tablet (200 mg total) by mouth 2 (two) times daily. Hold until you see your cardiologist     amLODipine (NORVASC) 10 MG tablet Take 10 mg by mouth daily.      apixaban (ELIQUIS) 5 MG TABS tablet Take 1 tablet (5 mg total) by mouth 2 (two) times daily. Restart from Thursday 60 tablet    atenolol (TENORMIN) 25 MG tablet Take 25 mg by mouth daily.      citalopram (CELEXA) 20 MG tablet Take 20 mg by mouth daily.     clotrimazole (LOTRIMIN) 1 % cream Apply 1 application topically as needed (rash on hands).      doxazosin (CARDURA) 4 MG tablet Take 4 mg by mouth daily.     furosemide (LASIX) 20 MG tablet Take 20 mg by mouth daily.     losartan (COZAAR) 100 MG tablet Take 100 mg by mouth daily.     potassium chloride SA (KLOR-CON M) 20 MEQ tablet Take 20 mEq by mouth daily.     pravastatin (PRAVACHOL) 20 MG tablet Take 20 mg by mouth at bedtime.     No current facility-administered medications for this visit.    Review of Systems  Constitutional:  Negative for appetite change, chills, fatigue and fever.  HENT:   Negative for hearing loss and voice change.   Eyes:  Negative for eye problems.  Respiratory:  Negative for chest tightness and cough.   Cardiovascular:  Negative for chest pain.  Gastrointestinal:  Negative for abdominal distention, abdominal pain and blood in stool.  Endocrine: Negative for hot flashes.  Genitourinary:  Negative for difficulty urinating and frequency.   Musculoskeletal:  Negative for arthralgias.  Skin:  Negative for itching and rash.  Neurological:  Negative for extremity weakness.  Hematological:  Negative for adenopathy.  Psychiatric/Behavioral:  Negative for confusion.    PHYSICAL EXAMINATION: ECOG PERFORMANCE STATUS: 1 - Symptomatic but completely ambulatory Vitals:   12/27/22 1208  BP: (!) 141/64  Pulse:  (!) 50  Resp: 18  Temp: (!) 97.2 F (36.2 C)   Filed Weights   12/27/22 1208  Weight: 229 lb 9.6 oz (104.1 kg)    Physical Exam Constitutional:      General: She is not in acute distress.    Appearance: She is obese.  HENT:     Head: Normocephalic and atraumatic.  Eyes:     General: No scleral icterus. Cardiovascular:     Rate and Rhythm: Normal rate.  Pulmonary:     Effort: Pulmonary effort is normal. No respiratory distress.     Breath sounds: No wheezing.  Abdominal:     General: There is no distension.  Musculoskeletal:        General: No deformity.  Normal range of motion.     Cervical back: Normal range of motion.  Skin:    Findings: No rash.  Neurological:     Mental Status: She is alert and oriented to person, place, and time. Mental status is at baseline.     Cranial Nerves: No cranial nerve deficit.     Coordination: Coordination normal.  Psychiatric:        Mood and Affect: Mood normal.     LABORATORY DATA:  I have reviewed the data as listed    Latest Ref Rng & Units 11/29/2022   12:56 PM 10/19/2022    4:01 PM 10/17/2022    4:10 PM  CBC  WBC 4.0 - 10.5 K/uL 4.5  15.4  12.1   Hemoglobin 12.0 - 15.0 g/dL 11.9  11.4  12.1   Hematocrit 36.0 - 46.0 % 35.3  35.3  36.8   Platelets 150 - 400 K/uL 194  187  189       Latest Ref Rng & Units 11/29/2022   12:56 PM 10/19/2022    4:13 AM 10/18/2022    3:04 PM  CMP  Glucose 70 - 99 mg/dL 109  164    BUN 8 - 23 mg/dL 12  21    Creatinine 0.44 - 1.00 mg/dL 0.95  0.84    Sodium 135 - 145 mmol/L 135  141    Potassium 3.5 - 5.1 mmol/L 3.1  4.0  3.0   Chloride 98 - 111 mmol/L 96  107    CO2 22 - 32 mmol/L 29  28    Calcium 8.9 - 10.3 mg/dL 8.9  8.6    Total Protein 6.5 - 8.1 g/dL 7.5     Total Bilirubin 0.3 - 1.2 mg/dL 0.7     Alkaline Phos 38 - 126 U/L 66     AST 15 - 41 U/L 25     ALT 0 - 44 U/L 19         RADIOGRAPHIC STUDIES: I have personally reviewed the radiological images as listed and agreed with  the findings in the report. NM PET DOTATATE SKULL BASE TO MID THIGH  Result Date: 12/17/2022 CLINICAL DATA:  Small-bowel mass. EXAM: NUCLEAR MEDICINE PET SKULL BASE TO THIGH TECHNIQUE: 4.4 mCi copper 5 DOTATATE was injected intravenously. Full-ring PET imaging was performed from the skull base to thigh after the radiotracer. CT data was obtained and used for attenuation correction and anatomic localization. COMPARISON:  None Available. FINDINGS: NECK No radiotracer activity in neck lymph nodes. Incidental CT findings: None CHEST No radiotracer accumulation within mediastinal or hilar lymph nodes. Several small RIGHT lung nodules. For example 5 mm nodule on image 51/4. 6 mm nodule on image 56/4. 4 mm nodule on image 66/4. No radiotracer associated with these nodules. Incidental CT finding:None ABDOMEN/PELVIS Within the RIGHT lower quadrant there is a focus of intense radiotracer activity associated with the distal ileum with SUV max equal 75 on image 112. On the CT portion exam there is a rounded soft tissue lesion in the distal ileum measuring 19 mm x 17 mm (image 112/series 4). This small-bowel lesion is essentially at the terminal ileum In the ileocecal mesentery, adjacent to the cecum and distal ileum, there is a rounded nodule measuring 21 mm x 24 mm (image 106) also with intense radiotracer activity (SUV max equal 48). No additional radiotracer avid nodules are present in the mesentery. No retroperitoneal radiotracer at avid adenopathy. No abnormal activity in the liver. No evidence  of bowel obstruction. Physiologic activity noted in the liver, spleen, adrenal glands and kidneys. Incidental CT findings:Uterus and adnexa unremarkable. SKELETON No focal activity to suggest skeletal metastasis. Incidental CT findings:None IMPRESSION: 1. Intense radiotracer activity associated with a rounded soft tissue mass in the distal ileum/terminal ileum consistent well differentiated neuroendocrine tumor. Favor small  bowel primary neuroendocrine tumor. 2. Local neuroendocrine tumor nodal metastasis to the ileocecal mesentery. 3. No evidence of additional metastatic adenopathy in the abdomen pelvis. 4. No evidence of liver metastasis. 5. No evidence of distant metastatic disease. 6. Several small RIGHT lung nodules are indeterminate but favored unrelated to neuroendocrine tumor And favored benign Electronically Signed   By: Suzy Bouchard M.D.   On: 12/17/2022 15:17   CT Angio Abd/Pel w/ and/or w/o  Result Date: 11/20/2022 CLINICAL DATA:  Ischemic colitis. Spiculated soft tissue mass noted adjacent ascending colon on prior CT. Colonoscopy performed on 10/21/2022 shows continuous area of nonbleeding ulcerated mucosa in the proximal ascending colon consistent with ischemic colitis. Biopsies obtained at the time of colonoscopy shows ischemic colitis and ascending colon. EXAM: CTA ABDOMEN AND PELVIS WITHOUT AND WITH CONTRAST TECHNIQUE: Multidetector CT imaging of the abdomen and pelvis was performed using the standard protocol during bolus administration of intravenous contrast. Multiplanar reconstructed images and MIPs were obtained and reviewed to evaluate the vascular anatomy. RADIATION DOSE REDUCTION: This exam was performed according to the departmental dose-optimization program which includes automated exposure control, adjustment of the mA and/or kV according to patient size and/or use of iterative reconstruction technique. CONTRAST:  140m OMNIPAQUE IOHEXOL 350 MG/ML SOLN COMPARISON:  CT abdomen pelvis 10/17/2022 FINDINGS: VASCULAR Aorta: Minimal scattered calcified atheromatous plaque without aneurysm or flow-limiting stenosis. 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 Splenic, left gastric and common hepatic arteries are patent. SMA: Minimal narrowing at the origin of the superior mesenteric artery due to noncalcified atheromatous plaque. The  superior mesenteric artery is otherwise patent. The right colic artery is patent. The right lower quadrant mesenteric mass appears to be parasitizing significant amounts of the arterial blood flow from the right colic artery likely causing ischemia of the downstream segment of ascending colon. Renals: Calcified atheromatous plaque at the origin of the right main renal artery without flow-limiting stenosis. Accessory right renal artery supplying the anterior lower pole is patent without significant stenosis. No significant narrowing of the left main renal artery. Accessory left renal artery supplying the lower pole is also patent without significant narrowing. IMA: Patent without evidence of aneurysm, dissection, vasculitis or significant stenosis. Inflow: Patent without evidence of aneurysm, dissection, vasculitis or significant stenosis. Proximal Outflow: Bilateral common femoral and visualized portions of the superficial and profunda femoral arteries are patent without evidence of aneurysm, dissection, vasculitis or significant stenosis. Veins: No significant abnormality of the hepatic, portal, superior mesenteric veins. Review of the MIP images confirms the above findings. NON-VASCULAR Lower chest: Calcified right lung base nodules likely related to prior granulomatous inflammation. No acute abnormality of the visualized lung bases. Hepatobiliary: Subcentimeter hypodensity in the posterior aspect of the right hepatic lobe (image 44, series 12) is too small to fully characterize. It is not significantly changed since the prior exam. Liver, gallbladder common bile ducts are otherwise unremarkable. Pancreas: Fatty atrophy of the pancreatic body and head. Pancreas otherwise normal. Spleen: Normal in size without focal abnormality. Adrenals/Urinary Tract: Adrenal glands are unremarkable. Kidneys are normal, without renal calculi, focal lesion, or hydronephrosis. Tiny focus  of air noted within the bladder lumen likely  due to recent catheterization. Bladder otherwise normal. Stomach/Bowel: No bowel dilatation to indicate ileus or obstruction. Sigmoid colon is markedly redundant. Mild diverticulosis of the descending and sigmoid colon without evidence of acute diverticulitis. Persistent thickening of the wall of the proximal ascending colon best seen on images 118-129 of series 12. There is a mildly hyperenhancing mass within the terminal ileum measuring 2.4 x 1.8 x 1.7 cm, best seen on image 129 of series 12 and image 44 of series 15. Previously seen right lower quadrant mass abutting the ascending colon is not significantly changed in size measuring 3.2 x 2.3 x 2.2 Cm on the current examination compared to 2.7 x 2.3 x 2.1 cm on the prior study. Appendix is unremarkable. Lymphatic: No enlarged abdominal or pelvic lymph nodes. Reproductive: Uterus and bilateral adnexa are unremarkable. Other: Small fat containing umbilical hernia. Musculoskeletal: No acute or significant osseous findings. IMPRESSION: 1. 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 2. Minimal narrowing at the origin of the superior mesenteric artery due to noncalcified atheromatous plaque. 3. 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 4. Persistent thickening of the wall of the proximal ascending colon consistent with biopsy-proven ischemic colitis. The mesenteric mass appears to be parasitizing the arterial flow from the right colic artery, likely causing ischemia of the downstream proximal ascending colon. 5. Mild colonic diverticulosis without evidence of acute diverticulitis. Electronically Signed   By: Miachel Roux M.D.   On: 11/20/2022 15:17   DG Abd 1 View  Result Date: 10/19/2022 CLINICAL DATA:  Abdominal pain EXAM: ABDOMEN - 1 VIEW COMPARISON:  None Available. FINDINGS: Gaseous distention of colon noted consistent with ileus versus distal obstruction. No organomegaly. No radiopaque calculi. IMPRESSION: Distention consistent with ileus or distal obstruction Colonic gaseous Electronically Signed   By: Sammie Bench M.D.   On: 10/19/2022 18:32   CT Angio Chest PE W/Cm &/Or Wo Cm  Result Date: 10/17/2022 CLINICAL DATA:  Right-sided back pain radiating to right flank and inguinal region, nausea EXAM: CT ANGIOGRAPHY CHEST CT ABDOMEN AND PELVIS WITH CONTRAST TECHNIQUE: Multidetector CT imaging of the chest was performed using the standard protocol during bolus administration of intravenous contrast. Multiplanar CT image reconstructions and MIPs were obtained to evaluate the vascular anatomy. Multidetector CT imaging of the abdomen and pelvis was performed using the standard protocol during bolus administration of intravenous contrast. RADIATION DOSE REDUCTION: This exam was performed according to the departmental dose-optimization program which includes automated exposure control, adjustment of the mA and/or kV according to patient size and/or use of iterative reconstruction technique. CONTRAST:  179m OMNIPAQUE IOHEXOL 350 MG/ML SOLN COMPARISON:  10/09/2022 FINDINGS: CTA CHEST FINDINGS Cardiovascular: This is a technically adequate evaluation of the pulmonary vasculature. No filling defects or pulmonary emboli. The heart is enlarged without pericardial effusion. No evidence of thoracic aortic aneurysm or dissection. Atherosclerosis of the aorta and coronary vasculature. Mediastinum/Nodes: No enlarged mediastinal, hilar, or axillary lymph nodes. Thyroid gland, trachea, and esophagus demonstrate no significant findings. Small hiatal hernia. Lungs/Pleura: Hypoventilatory changes are seen at the lung bases. Trace  bilateral pleural effusions. No airspace disease or pneumothorax. Central airways  are patent. Musculoskeletal: No acute or destructive bony lesions. Reconstructed images demonstrate no additional findings. Review of the MIP images confirms the above findings. CT ABDOMEN and PELVIS FINDINGS Hepatobiliary: No focal liver abnormality is seen. No gallstones, gallbladder wall thickening, or biliary dilatation. Pancreas: Diffuse fatty infiltration of the pancreas. There is some mild fat stranding along the ventral aspect of the pancreatic body consistent with acute pancreatitis. No fluid collection, pseudocyst, or abscess. Spleen: Normal in size without focal abnormality. Adrenals/Urinary Tract: Adrenal glands are unremarkable. Kidneys are normal, without renal calculi, focal lesion, or hydronephrosis. Bladder is unremarkable. Stomach/Bowel: No bowel obstruction or ileus. Scattered colonic diverticulosis without diverticulitis. Normal appendix right lower quadrant. A spiculated soft tissue mass identified along the medial aspect of the ascending colon, measuring 2.3 x 2.7 x 2.1 cm reference image 41/5. Findings are highly concerning for malignancy, including primary colonic malignancy, metastatic disease, or gastrointestinal stromal tumor. There is loss of normal fat plane between this mass and the serosal surface of the ascending colon. Further evaluation with PET CT is recommended. Vascular/Lymphatic: Aortic atherosclerosis. No enlarged abdominal or pelvic lymph nodes. Reproductive: Uterus and bilateral adnexa are unremarkable. Other: No free fluid or free intraperitoneal gas. Small fat containing umbilical hernia. Musculoskeletal: No acute or destructive bony lesions. Reconstructed images demonstrate no additional findings. Review of the MIP images confirms the above findings. IMPRESSION: Chest: 1. No evidence of pulmonary embolus. 2. Bibasilar hypoventilatory changes and trace bilateral effusions. No acute airspace  disease. 3. Aortic Atherosclerosis (ICD10-I70.0). Coronary artery atherosclerosis Abdomen/pelvis: 1. Spiculated soft tissue mass along the serosal surface medial aspect ascending colon, measuring up to 2.7 cm. Differential diagnosis includes primary colonic malignancy, metastatic disease, or gastrointestinal stromal tumor. Further evaluation with PET-CT is recommended. 2. Mild peripancreatic fat stranding consistent with acute uncomplicated pancreatitis. Diffuse fatty infiltration of the pancreas. 3. Sigmoid diverticulosis without diverticulitis. 4. Small hiatal hernia. 5.  Aortic Atherosclerosis (ICD10-I70.0). Electronically Signed   By: Randa Ngo M.D.   On: 10/17/2022 21:00   CT Abdomen Pelvis W Contrast  Result Date: 10/17/2022 CLINICAL DATA:  Right-sided back pain radiating to right flank and inguinal region, nausea EXAM: CT ANGIOGRAPHY CHEST CT ABDOMEN AND PELVIS WITH CONTRAST TECHNIQUE: Multidetector CT imaging of the chest was performed using the standard protocol during bolus administration of intravenous contrast. Multiplanar CT image reconstructions and MIPs were obtained to evaluate the vascular anatomy. Multidetector CT imaging of the abdomen and pelvis was performed using the standard protocol during bolus administration of intravenous contrast. RADIATION DOSE REDUCTION: This exam was performed according to the departmental dose-optimization program which includes automated exposure control, adjustment of the mA and/or kV according to patient size and/or use of iterative reconstruction technique. CONTRAST:  155m OMNIPAQUE IOHEXOL 350 MG/ML SOLN COMPARISON:  10/09/2022 FINDINGS: CTA CHEST FINDINGS Cardiovascular: This is a technically adequate evaluation of the pulmonary vasculature. No filling defects or pulmonary emboli. The heart is enlarged without pericardial effusion. No evidence of thoracic aortic aneurysm or dissection. Atherosclerosis of the aorta and coronary vasculature.  Mediastinum/Nodes: No enlarged mediastinal, hilar, or axillary lymph nodes. Thyroid gland, trachea, and esophagus demonstrate no significant findings. Small hiatal hernia. Lungs/Pleura: Hypoventilatory changes are seen at the lung bases. Trace bilateral pleural effusions. No airspace disease or pneumothorax. Central airways are patent. Musculoskeletal: No acute or destructive bony lesions. Reconstructed images demonstrate no additional findings. Review of the MIP images confirms the above findings. CT ABDOMEN and PELVIS FINDINGS Hepatobiliary: No focal liver abnormality is seen. No gallstones, gallbladder wall  thickening, or biliary dilatation. Pancreas: Diffuse fatty infiltration of the pancreas. There is some mild fat stranding along the ventral aspect of the pancreatic body consistent with acute pancreatitis. No fluid collection, pseudocyst, or abscess. Spleen: Normal in size without focal abnormality. Adrenals/Urinary Tract: Adrenal glands are unremarkable. Kidneys are normal, without renal calculi, focal lesion, or hydronephrosis. Bladder is unremarkable. Stomach/Bowel: No bowel obstruction or ileus. Scattered colonic diverticulosis without diverticulitis. Normal appendix right lower quadrant. A spiculated soft tissue mass identified along the medial aspect of the ascending colon, measuring 2.3 x 2.7 x 2.1 cm reference image 41/5. Findings are highly concerning for malignancy, including primary colonic malignancy, metastatic disease, or gastrointestinal stromal tumor. There is loss of normal fat plane between this mass and the serosal surface of the ascending colon. Further evaluation with PET CT is recommended. Vascular/Lymphatic: Aortic atherosclerosis. No enlarged abdominal or pelvic lymph nodes. Reproductive: Uterus and bilateral adnexa are unremarkable. Other: No free fluid or free intraperitoneal gas. Small fat containing umbilical hernia. Musculoskeletal: No acute or destructive bony lesions.  Reconstructed images demonstrate no additional findings. Review of the MIP images confirms the above findings. IMPRESSION: Chest: 1. No evidence of pulmonary embolus. 2. Bibasilar hypoventilatory changes and trace bilateral effusions. No acute airspace disease. 3. Aortic Atherosclerosis (ICD10-I70.0). Coronary artery atherosclerosis Abdomen/pelvis: 1. Spiculated soft tissue mass along the serosal surface medial aspect ascending colon, measuring up to 2.7 cm. Differential diagnosis includes primary colonic malignancy, metastatic disease, or gastrointestinal stromal tumor. Further evaluation with PET-CT is recommended. 2. Mild peripancreatic fat stranding consistent with acute uncomplicated pancreatitis. Diffuse fatty infiltration of the pancreas. 3. Sigmoid diverticulosis without diverticulitis. 4. Small hiatal hernia. 5.  Aortic Atherosclerosis (ICD10-I70.0). Electronically Signed   By: Randa Ngo M.D.   On: 10/17/2022 21:00   CT Renal Stone Study  Result Date: 10/17/2022 CLINICAL DATA:  Abdominal pain, flank pain EXAM: CT ABDOMEN AND PELVIS WITHOUT CONTRAST TECHNIQUE: Multidetector CT imaging of the abdomen and pelvis was performed following the standard protocol without IV contrast. RADIATION DOSE REDUCTION: This exam was performed according to the departmental dose-optimization program which includes automated exposure control, adjustment of the mA and/or kV according to patient size and/or use of iterative reconstruction technique. COMPARISON:  None Available. FINDINGS: Lower chest: There are small linear patchy infiltrates in the lower lung fields, more so on the right side. Heart is enlarged in size. Minimal right pleural effusion is seen. Hepatobiliary: No focal abnormalities are seen. Anterior bulging is seen in the left lobe of liver, possibly within ventral hernia. There is no dilation of bile ducts. Gallbladder is unremarkable. Pancreas: There is fatty infiltration in the pancreas, more so in the  head. There is minimal stranding in the fat planes adjacent to head of the pancreas. There are no loculated fluid collections in or around the pancreas. Spleen: Unremarkable. Adrenals/Urinary Tract: Adrenals are unremarkable. There is no hydronephrosis. There are no renal or ureteral stones. Urinary bladder is not distended. Stomach/Bowel: Small hiatal hernia is seen. Stomach is unremarkable. Small bowel loops are not dilated. Appendix is difficult to visualize. In image 62 of series 5, there is a small caliber tubular structure inseparable from tip of cecum, possibly normal appendix. There is no focal pericecal inflammation. Multiple diverticula are seen in colon without focal acute diverticulitis. Vascular/Lymphatic: Scattered arterial calcifications are seen. In image 33 of series 2, there is 2.5 x 2.3 cm nodular density adjacent to the medial margin of ascending colon. There are few subcentimeter nodes in retroperitoneum. Reproductive: Unremarkable.  Other: There is no ascites or pneumoperitoneum. Umbilical hernia containing fat is seen. Musculoskeletal: Degenerative changes are noted in lumbar spine. IMPRESSION: There is no evidence of intestinal obstruction or pneumoperitoneum. There is no hydronephrosis. Appendix is not dilated. There is 2.5 cm smooth marginated soft tissue density lesion adjacent to the medial margin of ascending colon. Possibility of metastatic lymphadenopathy is not excluded. Short-term follow-up CT abdomen and pelvis in 3 months along with PET-CT if warranted should be considered. If the patient has any known primary malignancy, immediate further evaluation with PET-CT may be considered. There is minimal stranding in fat planes adjacent to the head of the pancreas which may be a normal variation or suggest early changes of acute pancreatitis. Please correlate with laboratory findings. There is no loculated fluid collection in or around the pancreas. Small hiatal hernia. Increased markings  in the lower lung fields, more so on the right side may suggest scarring or atelectasis/pneumonia. Minimal right pleural effusion. Diverticulosis of colon without signs of focal diverticulitis. Other findings as described in the body of the report. Electronically Signed   By: Elmer Picker M.D.   On: 10/17/2022 16:49

## 2023-01-09 ENCOUNTER — Ambulatory Visit: Payer: Self-pay | Admitting: General Surgery

## 2023-01-09 NOTE — H&P (View-Only) (Signed)
HISTORY OF PRESENT ILLNESS:    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  Patient who presented with abdominal pain and admitted to the hospital for further evaluation.  Initial concern of ischemic colitis was ruled out.  She had a colonoscopy which showed an ulcerated mucosa of the ascending colon but no masses, identified.  Further workup with CT scan of the abdomen and pelvis showed a large mass at the mesentery between the terminal ileum and cecum.  There was also abnormally looking lymph nodes in the periphery of the terminal ileum and ascending colon.  Appearance of the mass was suspicious for neuroendocrine tumor.  Patient had a PET dotatate scan which was positive for activity in the mass at the distal terminal ileum consistent with differentiated neuroendocrine tumor.  Local nodal metastases was also identified.  No evidence of other metastatic disease.  I personally Maletta the images.  Patient was evaluated by medical oncology and upfront surgical resection of the mass was recommended.  Patient here for discussion of surgical intervention.      PAST MEDICAL HISTORY:  Past Medical History:  Diagnosis Date   Carcinoma in situ of breast    COVID-19 08/2021   Depressive disorder, not elsewhere classified    Essential hypertension, benign    History of bronchitis    History of recurrent bronchitis, stable.   Impaired fasting glucose    Osteoporosis, unspecified    Other and unspecified hyperlipidemia    Unspecified sleep apnea         PAST SURGICAL HISTORY:   Past Surgical History:  Procedure Laterality Date   MASTECTOMY Left 2002   Partial left mastectomy in 2002.   COLONOSCOPY  01/20/2006   Hyperplastic Polyps: CBF 01/2016; Recall Ltr mailed 12/11/2015 (dw);  OV made 01/26/2016 @ 10:30am w/Kim Mills NP (dw)   CATARACT EXTRACTION Bilateral 2014   COLONOSCOPY  02/22/2016   Adenomatous  Polyps: CBF 02/2021   Right total knee arthroplasty using computer-assisted navigation  08/27/2017   Dr Hooten   Left total knee arthroplasty using computer-assisted navigation  09/20/2020   Dr Hooten         MEDICATIONS:  Outpatient Encounter Medications as of 01/09/2023  Medication Sig Dispense Refill   acetaminophen (TYLENOL) 500 MG tablet Take 500 mg by mouth every 6 (six) hours as needed       amLODIPine (NORVASC) 10 MG tablet TAKE 1 TABLET BY MOUTH DAILY 90 tablet 3   apixaban (ELIQUIS) 5 mg tablet Take by mouth     apixaban (ELIQUIS) 5 mg tablet Take 1 tablet (5 mg total) by mouth every 12 (twelve) hours 180 tablet 1   atenoloL (TENORMIN) 25 MG tablet TAKE 1 TABLET BY MOUTH DAILY 90 tablet 3   citalopram (CELEXA) 20 MG tablet TAKE 1 TABLET BY MOUTH ONCE DAILY 90 tablet 3   clotrimazole (LOTRIMIN) 1 % cream Apply topically 2 (two) times daily 14 g 1   doxazosin (CARDURA) 4 MG tablet TAKE 1 TABLET BY MOUTH DAILY 90 tablet 3   FUROsemide (LASIX) 20 MG tablet Take 1 tablet (20 mg total) by mouth once daily 90 tablet 3   hydroCHLOROthiazide (HYDRODIURIL) 25 MG tablet TAKE 1 TABLET BY MOUTH DAILY 90 tablet 3   losartan (COZAAR) 100 MG tablet TAKE 1 TABLET BY MOUTH ONCE DAILY 90 tablet 3   multivitamin tablet Take 1 tablet by mouth once daily       potassium chloride (KLOR-CON) 20 MEQ ER tablet Take 1 tablet (20 mEq total) by mouth once daily 90 tablet 3   pravastatin (PRAVACHOL) 20 MG tablet TAKE 1 TABLET BY MOUTH AT  NIGHT 90 tablet 3   metroNIDAZOLE (FLAGYL) 500 MG tablet Take 2 tablets at 2 pm, 3 pm and 10 pm the day before surgery. 6 tablet 0   neomycin 500 mg tablet Take 2 tablets at 2 pm, 3 pm and 10 pm the day before surgery. 6 tablet 0   omega 3-dha-epa-fish oil 360 mg-144 mg- 216 mg-1,200 mg CpDR Take 2,400 mg by mouth once daily (Patient not taking: Reported on 11/26/2022)     No facility-administered encounter medications on file as of 01/09/2023.     ALLERGIES:   Ace inhibitors,  Avelox [moxifloxacin], Penicillins, Codeine phosphate, and Zocor [simvastatin]   SOCIAL HISTORY:  Social History   Socioeconomic History   Marital status: Married    Spouse name: Dominique Moore   Number of children: 2   Years of education: 12  Occupational History   Occupation: Retired  Tobacco Use   Smoking status: Never   Smokeless tobacco: Never  Vaping Use   Vaping Use: Never used  Substance and Sexual Activity   Alcohol use: No    Alcohol/week: 0.0 standard drinks of alcohol   Drug use: No   Sexual activity: Defer    Partners: Male    FAMILY HISTORY:  Family History  Problem Relation Age of Onset   Breast cancer Mother    Heart disease Mother    Diabetes type II Mother    Myocardial Infarction (Heart attack) Mother    High blood pressure (Hypertension) Mother    Fibrocystic breast disease Sister    Heart disease Brother        Rheumatic heart disease.   Fibrocystic breast disease Sister    Fibrocystic breast disease Sister    Fibrocystic breast disease Sister    Cancer Father      GENERAL REVIEW OF SYSTEMS:   General ROS: negative for - chills, fatigue, fever, weight gain or weight loss Allergy and Immunology ROS: negative for - hives  Hematological and Lymphatic ROS: negative for - bleeding problems or bruising, negative for palpable nodes Endocrine ROS: negative for - heat or cold intolerance, hair changes Respiratory ROS: negative for - cough, shortness of breath or wheezing Cardiovascular ROS: no chest pain or palpitations GI ROS: negative for nausea, vomiting, abdominal pain, diarrhea, constipation Musculoskeletal ROS: negative for - joint swelling or muscle pain Neurological ROS: negative for - confusion, syncope Dermatological ROS: negative for pruritus and rash  PHYSICAL EXAM:  Vitals:   01/09/23 1048  BP: (!) 142/74  Pulse: 62  .  Ht:154.9 cm (5' 1") Wt:(!) 101.2 kg (223 lb) BSA:Body surface area is 2.09 meters squared. Body mass index is  42.14 kg/m..   GENERAL: Alert, active, oriented x3  HEENT: Pupils equal reactive to light. Extraocular movements are intact. Sclera clear. Palpebral conjunctiva normal red color.Pharynx clear.  NECK: Supple with no palpable mass and no adenopathy.  LUNGS: Sound clear with no rales rhonchi or wheezes.  HEART: Regular rhythm S1 and S2 without murmur.  ABDOMEN: Soft and depressible, nontender with no palpable mass, no hepatomegaly.   EXTREMITIES: Well-developed well-nourished symmetrical with no dependent edema.  NEUROLOGICAL: Awake alert oriented, facial expression symmetrical, moving all extremities.      IMPRESSION:     Small bowel mass [K63.89]  -Highly suspicious for neuroendocrine tumor with high   activity in the PET dotatate scan.  Local nodal metastatic disease also identified.  No distant metastatic disease identified. -Patient already evaluated by medical oncology and resection was recommended -Patient was oriented about surgical intervention that includes right hemicolectomy.  Discussed with patient alternative of minimally invasive approach.  Discussed with patient the risk of surgery that includes bleeding, infection, anastomosis leak, bowel obstruction, injury to adjacent organs such as intestine, liver, kidney, ureter, bladder, stoma, among others.  Also discussed with patient the risk of anesthesia. -Patient with history of A-fib on Eliquis.  Also with history of hypertension.  I recommend to be evaluated by cardiology for cardiac restratification. -Once cleared by his cardiologist, patient already consented and agreed to proceed with right hemicolectomy.         PLAN:  1.  Robotic assisted laparoscopic right hemicolectomy (44205) 2.  CBC, CMP done 3.  Cardiology clearance 4.  Bowel prep the day before as instructed 5.  Take the prescribed antibiotic therapy the day before surgery 6.  Will need to hold Eliquis today before surgery  Patient verbalized understanding,  all questions were answered, and were agreeable with the plan outlined above.   Dominique Audia Cintron-Diaz, MD  Electronically signed by Dominique Dykman Cintron-Diaz, MD  

## 2023-01-09 NOTE — H&P (Signed)
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  Patient who presented with abdominal pain and admitted to the hospital for further evaluation.  Initial concern of ischemic colitis was ruled out.  She had a colonoscopy which showed an ulcerated mucosa of the ascending colon but no masses, identified.  Further workup with CT scan of the abdomen and pelvis showed a large mass at the mesentery between the terminal ileum and cecum.  There was also abnormally looking lymph nodes in the periphery of the terminal ileum and ascending colon.  Appearance of the mass was suspicious for neuroendocrine tumor.  Patient had a PET dotatate scan which was positive for activity in the mass at the distal terminal ileum consistent with differentiated neuroendocrine tumor.  Local nodal metastases was also identified.  No evidence of other metastatic disease.  I personally Maletta the images.  Patient was evaluated by medical oncology and upfront surgical resection of the mass was recommended.  Patient here for discussion of surgical intervention.      PAST MEDICAL HISTORY:  Past Medical History:  Diagnosis Date   Carcinoma in situ of breast    COVID-19 08/2021   Depressive disorder, not elsewhere classified    Essential hypertension, benign    History of bronchitis    History of recurrent bronchitis, stable.   Impaired fasting glucose    Osteoporosis, unspecified    Other and unspecified hyperlipidemia    Unspecified sleep apnea         PAST SURGICAL HISTORY:   Past Surgical History:  Procedure Laterality Date   MASTECTOMY Left 2002   Partial left mastectomy in 2002.   COLONOSCOPY  01/20/2006   Hyperplastic Polyps: CBF 01/2016; Recall Ltr mailed 12/11/2015 (dw);  OV made 01/26/2016 @ 10:30am w/Kim Jerelene Redden NP (dw)   CATARACT EXTRACTION Bilateral 2014   COLONOSCOPY  02/22/2016   Adenomatous  Polyps: CBF 02/2021   Right total knee arthroplasty using computer-assisted navigation  08/27/2017   Dr Marry Guan   Left total knee arthroplasty using computer-assisted navigation  09/20/2020   Dr Marry Guan         MEDICATIONS:  Outpatient Encounter Medications as of 01/09/2023  Medication Sig Dispense Refill   acetaminophen (TYLENOL) 500 MG tablet Take 500 mg by mouth every 6 (six) hours as needed       amLODIPine (NORVASC) 10 MG tablet TAKE 1 TABLET BY MOUTH DAILY 90 tablet 3   apixaban (ELIQUIS) 5 mg tablet Take by mouth     apixaban (ELIQUIS) 5 mg tablet Take 1 tablet (5 mg total) by mouth every 12 (twelve) hours 180 tablet 1   atenoloL (TENORMIN) 25 MG tablet TAKE 1 TABLET BY MOUTH DAILY 90 tablet 3   citalopram (CELEXA) 20 MG tablet TAKE 1 TABLET BY MOUTH ONCE DAILY 90 tablet 3   clotrimazole (LOTRIMIN) 1 % cream Apply topically 2 (two) times daily 14 g 1   doxazosin (CARDURA) 4 MG tablet TAKE 1 TABLET BY MOUTH DAILY 90 tablet 3   FUROsemide (LASIX) 20 MG tablet Take 1 tablet (20 mg total) by mouth once daily 90 tablet 3   hydroCHLOROthiazide (HYDRODIURIL) 25 MG tablet TAKE 1 TABLET BY MOUTH DAILY 90 tablet 3   losartan (COZAAR) 100 MG tablet TAKE 1 TABLET BY MOUTH ONCE DAILY 90 tablet 3   multivitamin tablet Take 1 tablet by mouth once daily  potassium chloride (KLOR-CON) 20 MEQ ER tablet Take 1 tablet (20 mEq total) by mouth once daily 90 tablet 3   pravastatin (PRAVACHOL) 20 MG tablet TAKE 1 TABLET BY MOUTH AT  NIGHT 90 tablet 3   metroNIDAZOLE (FLAGYL) 500 MG tablet Take 2 tablets at 2 pm, 3 pm and 10 pm the day before surgery. 6 tablet 0   neomycin 500 mg tablet Take 2 tablets at 2 pm, 3 pm and 10 pm the day before surgery. 6 tablet 0   omega 3-dha-epa-fish oil 360 mg-144 mg- 216 mg-1,200 mg CpDR Take 2,400 mg by mouth once daily (Patient not taking: Reported on 11/26/2022)     No facility-administered encounter medications on file as of 01/09/2023.     ALLERGIES:   Ace inhibitors,  Avelox [moxifloxacin], Penicillins, Codeine phosphate, and Zocor [simvastatin]   SOCIAL HISTORY:  Social History   Socioeconomic History   Marital status: Married    Spouse name: Lovelyn Zilles   Number of children: 2   Years of education: 12  Occupational History   Occupation: Retired  Tobacco Use   Smoking status: Never   Smokeless tobacco: Never  Vaping Use   Vaping Use: Never used  Substance and Sexual Activity   Alcohol use: No    Alcohol/week: 0.0 standard drinks of alcohol   Drug use: No   Sexual activity: Defer    Partners: Male    FAMILY HISTORY:  Family History  Problem Relation Age of Onset   Breast cancer Mother    Heart disease Mother    Diabetes type II Mother    Myocardial Infarction (Heart attack) Mother    High blood pressure (Hypertension) Mother    Fibrocystic breast disease Sister    Heart disease Brother        Rheumatic heart disease.   Fibrocystic breast disease Sister    Fibrocystic breast disease Sister    Fibrocystic breast disease Sister    Cancer Father      GENERAL REVIEW OF SYSTEMS:   General ROS: negative for - chills, fatigue, fever, weight gain or weight loss Allergy and Immunology ROS: negative for - hives  Hematological and Lymphatic ROS: negative for - bleeding problems or bruising, negative for palpable nodes Endocrine ROS: negative for - heat or cold intolerance, hair changes Respiratory ROS: negative for - cough, shortness of breath or wheezing Cardiovascular ROS: no chest pain or palpitations GI ROS: negative for nausea, vomiting, abdominal pain, diarrhea, constipation Musculoskeletal ROS: negative for - joint swelling or muscle pain Neurological ROS: negative for - confusion, syncope Dermatological ROS: negative for pruritus and rash  PHYSICAL EXAM:  Vitals:   01/09/23 1048  BP: (!) 142/74  Pulse: 62  .  Ht:154.9 cm (5\' 1" ) Wt:(!) 101.2 kg (223 lb) [id number] surface area is 2.09 meters squared. Body mass index is  42.14 kg/m.Marland Kitchen   GENERAL: Alert, active, oriented x3  HEENT: Pupils equal reactive to light. Extraocular movements are intact. Sclera clear. Palpebral conjunctiva normal red color.Pharynx clear.  NECK: Supple with no palpable mass and no adenopathy.  LUNGS: Sound clear with no rales rhonchi or wheezes.  HEART: Regular rhythm S1 and S2 without murmur.  ABDOMEN: Soft and depressible, nontender with no palpable mass, no hepatomegaly.   EXTREMITIES: Well-developed well-nourished symmetrical with no dependent edema.  NEUROLOGICAL: Awake alert oriented, facial expression symmetrical, moving all extremities.      IMPRESSION:     Small bowel mass [K63.89]  -Highly suspicious for neuroendocrine tumor with high  activity in the PET dotatate scan.  Local nodal metastatic disease also identified.  No distant metastatic disease identified. -Patient already evaluated by medical oncology and resection was recommended -Patient was oriented about surgical intervention that includes right hemicolectomy.  Discussed with patient alternative of minimally invasive approach.  Discussed with patient the risk of surgery that includes bleeding, infection, anastomosis leak, bowel obstruction, injury to adjacent organs such as intestine, liver, kidney, ureter, bladder, stoma, among others.  Also discussed with patient the risk of anesthesia. -Patient with history of A-fib on Eliquis.  Also with history of hypertension.  I recommend to be evaluated by cardiology for cardiac restratification. -Once cleared by his cardiologist, patient already consented and agreed to proceed with right hemicolectomy.         PLAN:  1.  Robotic assisted laparoscopic right hemicolectomy [id number]) 2.  CBC, CMP done 3.  Cardiology clearance 4.  Bowel prep the day before as instructed 5.  Take the prescribed antibiotic therapy the day before surgery 6.  Will need to hold Eliquis today before surgery  Patient verbalized understanding,  all questions were answered, and were agreeable with the plan outlined above.   Herbert Pun, MD  Electronically signed by Herbert Pun, MD

## 2023-01-21 ENCOUNTER — Encounter
Admission: RE | Admit: 2023-01-21 | Discharge: 2023-01-21 | Disposition: A | Discharge status: Home / Self Care | Payer: Medicare Other | Source: Ambulatory Visit | Attending: General Surgery | Admitting: General Surgery

## 2023-01-21 ENCOUNTER — Other Ambulatory Visit: Payer: Self-pay

## 2023-01-21 VITALS — Ht 62.0 in | Wt 233.0 lb

## 2023-01-21 DIAGNOSIS — E119 Type 2 diabetes mellitus without complications: Secondary | ICD-10-CM

## 2023-01-21 DIAGNOSIS — Z01818 Encounter for other preprocedural examination: Secondary | ICD-10-CM

## 2023-01-21 DIAGNOSIS — I10 Essential (primary) hypertension: Secondary | ICD-10-CM

## 2023-01-21 DIAGNOSIS — E876 Hypokalemia: Secondary | ICD-10-CM

## 2023-01-21 HISTORY — DX: Anxiety disorder, unspecified: F41.9

## 2023-01-21 HISTORY — DX: Unspecified atrial fibrillation: I48.91

## 2023-01-21 HISTORY — DX: Anemia, unspecified: D64.9

## 2023-01-21 HISTORY — DX: Dyspnea, unspecified: R06.00

## 2023-01-21 HISTORY — DX: Type 2 diabetes mellitus without complications: E11.9

## 2023-01-21 NOTE — Patient Instructions (Addendum)
Your procedure is scheduled on: Wednesday 01/29/23 To find out your arrival time, please call [phone] between [place] on:   Tuesday 01/28/23 Report to the Registration Desk on the 1st floor of the [facility]. Valet parking is available.  If your arrival time is 6:00 am, do not arrive before that time as the [facility] entrance doors do not open until 6:00 am.  REMEMBER: Instructions that are not followed completely may result in serious medical risk, up to and including death; or upon the discretion of your surgeon and anesthesiologist your surgery may need to be rescheduled.  DIET THE DAY BEFORE SURGERY AS INSTRUCTED BY DR Windell Moment  One week prior to surgery: Stop Anti-inflammatories (NSAIDS) such as Advil, Aleve, Ibuprofen, Motrin, Naproxen, Naprosyn and Aspirin based products such as Excedrin, Goody's Powder, BC Powder. You may however, continue to take Tylenol if needed for pain up until the day of surgery.  Stop ANY OVER THE COUNTER supplements until after surgery.  Continue taking all prescribed medications with the exception of the following: HOLD ELIQUIS AS INSTRUCTED BY CARDIOLOGY, USUALLY 1-2 DAYS BEFORE.  TAKE ONLY THESE MEDICATIONS THE MORNING OF SURGERY WITH A SIP OF WATER:  amLODipine (NORVASC) 10 MG tablet  atenolol (TENORMIN) 25 MG tablet  citalopram (CELEXA) 20 MG tablet  doxazosin (CARDURA) 4 MG tablet   No Alcohol for 24 hours before or after surgery.  No Smoking including e-cigarettes for 24 hours before surgery.  No chewable tobacco products for at least 6 hours before surgery.  No nicotine patches on the day of surgery.  Do not use any "recreational" drugs for at least a week (preferably 2 weeks) before your surgery.  Please be advised that the combination of cocaine and anesthesia may have negative outcomes, up to and including death. If you test positive for cocaine, your surgery will be cancelled.  On the morning of surgery brush your teeth  with toothpaste and water, you may rinse your mouth with mouthwash if you wish. Do not swallow any toothpaste or mouthwash.  Use CHG Soap or wipes as directed on instruction sheet.  Do not wear lotions, powders, or perfumes.   Do not shave body hair from the neck down 48 hours before surgery.  Wear comfortable clothing (specific to your surgery type) to the hospital.  Do not wear jewelry, make-up, hairpins, clips or nail polish.  Contact lenses, hearing aids and dentures may not be worn into surgery.  Do not bring valuables to the hospital. [facility] is not responsible for any missing/lost belongings or valuables.   Notify your doctor if there is any change in your medical condition (cold, fever, infection).  If you are being discharged the day of surgery, you will not be allowed to drive home. You will need a responsible individual to drive you home and stay with you for 24 hours after surgery.   If you are taking public transportation, you will need to have a responsible individual with you.  If you are being admitted to the hospital overnight, leave your suitcase in the car. After surgery it may be brought to your room.  In case of increased patient census, it may be necessary for you, the patient, to continue your postoperative care in the Same Day Surgery department.  After surgery, you can help prevent lung complications by doing breathing exercises.  Take deep breaths and cough every 1-2 hours. Your doctor may order a device called an Incentive Spirometer to help you take  deep breaths. When coughing or sneezing, hold a pillow firmly against your incision with both hands. This is called "splinting." Doing this helps protect your incision. It also decreases belly discomfort.  Surgery Visitation Policy:  Patients undergoing a surgery or procedure may have two family members or support persons with them as long as the person is not COVID-19 positive or experiencing its  symptoms.   Inpatient Visitation:    Visiting hours are 7 a.m. to 8 p.m. Up to four visitors are allowed at one time in a patient room. The visitors may rotate out with other people during the day. One designated support person (adult) may remain overnight.  Please call the [facility]. at [phone] if you have any questions about these instructions.     Preparing for Surgery with CHLORHEXIDINE GLUCONATE (CHG) Soap  Chlorhexidine Gluconate (CHG) Soap  o An antiseptic cleaner that kills germs and bonds with the skin to continue killing germs even after washing  o Used for showering the night before surgery and morning of surgery  Before surgery, you can play an important role by reducing the number of germs on your skin.  CHG (Chlorhexidine gluconate) soap is an antiseptic cleanser which kills germs and bonds with the skin to continue killing germs even after washing.  Please do not use if you have an allergy to CHG or antibacterial soaps. If your skin becomes reddened/irritated stop using the CHG.  1. Shower the NIGHT BEFORE SURGERY and the MORNING OF SURGERY with CHG soap.  2. If you choose to wash your hair, wash your hair first as usual with your normal shampoo.  3. After shampooing, rinse your hair and body thoroughly to remove the shampoo.  4. Use CHG as you would any other liquid soap. You can apply CHG directly to the skin and wash gently with a scrungie or a clean washcloth.  5. Apply the CHG soap to your body only from the neck down. Do not use on open wounds or open sores. Avoid contact with your eyes, ears, mouth, and genitals (private parts). Wash face and genitals (private parts) with your normal soap.  6. Wash thoroughly, paying special attention to the area where your surgery will be performed.  7. Thoroughly rinse your body with warm water.  8. Do not shower/wash with your normal soap after using and rinsing off the CHG soap.  9. Pat  yourself dry with a clean towel.  10. Wear clean pajamas to bed the night before surgery.  12. Place clean sheets on your bed the night of your first shower and do not sleep with pets.  13. Shower again with the CHG soap on the day of surgery prior to arriving at the hospital.  14. Do not apply any deodorants/lotions/powders.  15. Please wear clean clothes to the hospital.

## 2023-01-22 ENCOUNTER — Encounter
Admission: RE | Admit: 2023-01-22 | Discharge: 2023-01-22 | Disposition: A | Discharge status: Home / Self Care | Payer: Medicare Other | Source: Ambulatory Visit | Attending: General Surgery | Admitting: General Surgery

## 2023-01-22 ENCOUNTER — Encounter: Payer: Self-pay | Admitting: Urgent Care

## 2023-01-22 DIAGNOSIS — E876 Hypokalemia: Secondary | ICD-10-CM | POA: Diagnosis not present

## 2023-01-22 DIAGNOSIS — I10 Essential (primary) hypertension: Secondary | ICD-10-CM

## 2023-01-22 DIAGNOSIS — Z01818 Encounter for other preprocedural examination: Secondary | ICD-10-CM

## 2023-01-22 DIAGNOSIS — E119 Type 2 diabetes mellitus without complications: Secondary | ICD-10-CM | POA: Diagnosis not present

## 2023-01-22 LAB — TYPE AND SCREEN
ABO/RH(D): O POS
Antibody Screen: NEGATIVE

## 2023-01-22 LAB — POTASSIUM: Potassium: 3.3 mmol/L — ABNORMAL LOW (ref 3.5–5.1)

## 2023-01-23 LAB — HEMOGLOBIN A1C
Hgb A1c MFr Bld: 6.7 % — ABNORMAL HIGH (ref 4.8–5.6)
Mean Plasma Glucose: 146 mg/dL

## 2023-01-24 NOTE — Progress Notes (Addendum)
Perioperative Services Pre-Admission/Anesthesia Testing   Date: 01/24/23 Name: Dominique Moore MRN:   [id number]  Re: Consideration of preoperative prophylactic antibiotic change   Request sent to: Carolan Shiverintron-Diaz, Edgardo, MD (routed and/or faxed [facility])  Planned Surgical Procedure(s):    Case: [id number] Date/Time: 01/29/23 1115   Procedure: XI ROBOT ASSISTED RIGHT COLECTOMY   Anesthesia type: General   Pre-op diagnosis: K63.89 small bowel mass   Location: [facility] OR ROOM 07 / [facility] ORS FOR ANESTHESIA GROUP   Surgeons: Carolan Shiverintron-Diaz, Edgardo, MD      Clinical Notes:  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   EMR review indicated that patient received PCN and/or cephalosporin in the past as follows: CEFAZOLIN received on 08/27/2017 and 09/20/2020 with no documented ADRs. IgE (PCN) = 123 IU/ML on 09/07/2020  Screened as appropriate for cephalosporin use during medication reconciliation No immediate angioedema, dysphagia, SOB, anaphylaxis symptoms. No severe rash involving mucous membranes or skin necrosis. No hospital admissions related to side effects of PCN/cephalosporin use.  No documented reaction to PCN or cephalosporin in the last 10 years.  Request:  As an evidence based approach to reducing the rate of incidence for post-operative SSI and the development of MDROs, could an agent that allows for narrower antimicrobial coverage for preoperative prophylaxis in this patient's upcoming surgical course be considered?   Currently ordered preoperative prophylactic ABX: vancomycin.   Specifically requesting change to cephalosporin (CEFAZOLIN or CEFOTETAN).  Drug of choice for many procedures; it is the most widely studied antimicrobial agent with proven efficacy for antimicrobial prophylaxis.   Desirable duration of action, spectrum of activity against organisms commonly encountered in surgery,  and it has an excellent safety profile and low cost.   Active against streptococci, methicillin-susceptible staphylococci, and many gram-negative organisms.  Please communicate decision with me and I will change the orders in Epic as per your direction.   Things to consider: Many patients report that they were "allergic" to PCN earlier in life, however this does not translate into a true lifelong allergy. Patients can lose sensitivity to specific IgE antibodies over time if PCN is avoided (Kleris & Lugar, 2019).  Up to 10% of the adult population and 15% of hospitalized patients report an allergy to PCN, however clinical studies suggest that 90% of those reporting an allergy can tolerate PCN antibiotics (Kleris & Lugar, 2019).  Cross-sensitivity between PCN and cephalosporins has been documented as being as high as 10%, however this estimation included data believed to have been collected in a setting where there was contamination. Newer data suggests that the prevalence of cross-sensitivity between PCN and cephalosporins is actually estimated to be closer to 1% (Hermanides et al., 2018).   Patients labeled as PCN allergic, whether they are truly allergic or not, have been found to have inferior outcomes in terms of rates of serious infection, and these patients tend to have longer hospital stays [facility](Kleris & Lugar, 2019).  Treatment related secondary infections, such as Clostridioides difficile, have been linked to the improper use of broad spectrum antibiotics in patients improperly labeled as PCN allergic (Kleris & Lugar, 2019).  Anaphylaxis from cephalosporins is rare and the evidence suggests that there is no increased risk of an anaphylactic type reaction when cephalosporins are used in a PCN allergic patient (Pichichero, 2006).  Citations: Hermanides J, Lemkes BA, Prins Gwenyth BenderJM, Hollmann MW, Terreehorst I. Presumed ?-Lactam Allergy and Cross-reactivity in the Operating Theater: A  Practical Approach.  Anesthesiology. 2018 Aug;129(2):335-342. doi: 10.1097/ALN.0000000000002252. PMID: 00459977.  Kleris, R. S., & Lugar, P. L. (2019). Things We Do For No Reason: Failing to Question a Penicillin Allergy History. Journal of hospital medicine, 14(10), ([phone]. Advance online publication. airportbarriers.com  Pichichero, M. E. (2006). Cephalosporins can be prescribed safely for penicillin-allergic patients. Journal of family medicine, 55(2), 106-112. Accessed: https://cdn.mdedge.com/files/s70fs-public/Document/September-2017/5502JFP_AppliedEvidence1.pdf   Quentin Mulling, MSN, APRN, FNP-C, CEN [facility]  Peri-operative Services Nurse Practitioner FAX: [phone] 01/24/23 8:45 AM

## 2023-01-24 NOTE — Progress Notes (Signed)
  Perioperative Services Pre-Admission/Anesthesia Testing    Date: 01/24/23  Name: Dominique Moore MRN:   [id number]  Re: Clearance for colectomy  Planned Surgical Procedure(s):    Case: [id number] Date/Time: 01/29/23 1115   Procedure: XI ROBOT ASSISTED RIGHT COLECTOMY   Anesthesia type: General   Pre-op diagnosis: K63.89 small bowel mass   Location: [facility] OR ROOM 07 / [facility] ORS FOR ANESTHESIA GROUP   Surgeons: Carolan Shiver, MD   Clinical Notes:  Patient is scheduled for the above procedure on 01/29/2023 with Dr. Carolan Shiver, MD. Notes reviewed prior to upcoming procedure.  Surgeon indicated that he would like patient to be cleared by cardiology prior to upcoming procedure.  Clearance sent to patient's primary cardiology team Gwen Pounds, MD/Steiner, PA-C), however I received a note back from them saying that patient is no longer followed by cardiology service.  Of note, patient previously seen for atrial fibrillation.  She is being solely managed by her PCP Letitia Libra, MD) at this point.  Patient has had recent noninvasive cardiovascular studies as follows:  TTE was performed on 03/04/2022 revealing a normal left ventricular systolic function with an EF of >55%.  There was mild LVH.  There were no regional wall motion abnormalities.  There was mild biatrial enlargement.  Mild mitral, tricuspid, and pulmonary valve regurgitation noted.  All transvalvular gradients were noted to be normal providing no evidence suggestive of valvular stenosis.  Myocardial perfusion imaging study performed on 03/04/2022 revealed a normal left ventricular systolic function with a hyperdynamic LVEF of 72%.  There was normal regional wall motion and wall thickening.  Left ventricular cavity size normal.  There was no evidence of stress-induced myocardial ischemia or arrhythmia; no scintigraphic evidence of scar.  Study was determined to be indeterminant due to baseline ECG changes and atrial  fibrillation.  Reached out to surgeons office to discuss.  Per surgery scheduler Cordelia Pen), surgeon is out of the office. Surgeon was contacted by his office for direction and advised to relay to [place] that, in light of the fact that the patient is no longer seeing cardiology, he was fine with patient's PCP providing input and clearance for upcoming surgery. Clearance sent to Dr. Letitia Libra, who subsequently advised that patient could hold her apixaban for 2 days prior to her procedure with plans to restart since postoperatively respectively minimized by her primary attending surgeon. Medicine has cleared patient to proceed at an overall MODERATE risk of experiencing significant perioperative cardiovascular complications. Copy of signed clearance was placed on patient's chart for review by surgical/anesthetic team and inclusion in her medical record.  Quentin Mulling, MSN, APRN, FNP-C, CEN [facility]  Peri-operative Services Nurse Practitioner Phone: [phone] 01/24/23 8:31 AM  NOTE: This note has been prepared using Dragon dictation software. Despite my best ability to proofread, there is always the potential that unintentional transcriptional errors may still occur from this process.

## 2023-01-24 NOTE — Progress Notes (Signed)
  Perioperative Services Pre-Admission/Anesthesia Testing     Date: 01/24/23  Name: GABRELLA FREKING MRN:   [id number]  Re: Change in ABX for upcoming surgery   Case: [id number] Date/Time: 01/29/23 1115   Procedure: XI ROBOT ASSISTED RIGHT COLECTOMY   Anesthesia type: General   Pre-op diagnosis: K63.89 small bowel mass   Location: [facility] OR ROOM 07 / [facility] ORS FOR ANESTHESIA GROUP   Surgeons: Carolan Shiver, MD     Primary attending surgeon was consulted regarding consideration of therapeutic change in antimicrobial agent being used for preoperative prophylaxis in this patient's upcoming surgical case. Following analysis of the risk versus benefits, the patient's primary attending surgeon advised that it would be acceptable to discontinue the ordered vancomycin and place an order for CEFOTETAN 2 gm IV on call to the OR. Orders for this patient were amended by me following collaborative conversation with attending surgeon taking into consideration of risk versus benefits associated with the change in therapy.  Quentin Mulling, MSN, APRN, FNP-C, CEN [facility]  Peri-operative Services Nurse Practitioner Phone: [phone] 01/24/23 11:02 AM

## 2023-01-28 MED ORDER — FAMOTIDINE 20 MG PO TABS
20.0000 mg | ORAL_TABLET | Freq: Once | ORAL | Status: AC
  Administered 2023-01-29: 20 mg via ORAL

## 2023-01-28 MED ORDER — ALVIMOPAN 12 MG PO CAPS
12.0000 mg | ORAL_CAPSULE | ORAL | Status: AC
  Administered 2023-01-29: 12 mg via ORAL

## 2023-01-28 MED ORDER — SODIUM CHLORIDE 0.9 % IV SOLN: INTRAVENOUS | Status: DC

## 2023-01-28 MED ORDER — ACETAMINOPHEN 500 MG PO TABS
1000.0000 mg | ORAL_TABLET | ORAL | Status: AC
  Administered 2023-01-29: 1000 mg via ORAL

## 2023-01-28 MED ORDER — BUPIVACAINE LIPOSOME 1.3 % IJ SUSP: 20.0000 mL | Freq: Once | INTRAMUSCULAR | Status: DC

## 2023-01-28 MED ORDER — SODIUM CHLORIDE 0.9 % IV SOLN
2.0000 g | Freq: Once | INTRAVENOUS | Status: AC
  Administered 2023-01-29: 2 g via INTRAVENOUS

## 2023-01-28 MED ORDER — GABAPENTIN 300 MG PO CAPS
300.0000 mg | ORAL_CAPSULE | ORAL | Status: AC
  Administered 2023-01-29: 300 mg via ORAL

## 2023-01-28 MED ORDER — CHLORHEXIDINE GLUCONATE 0.12 % MT SOLN
15.0000 mL | Freq: Once | OROMUCOSAL | Status: AC
  Administered 2023-01-29: 15 mL via OROMUCOSAL

## 2023-01-28 MED ORDER — CELECOXIB 200 MG PO CAPS
200.0000 mg | ORAL_CAPSULE | ORAL | Status: AC
  Administered 2023-01-29: 200 mg via ORAL

## 2023-01-28 MED ORDER — ORAL CARE MOUTH RINSE: 15.0000 mL | Freq: Once | OROMUCOSAL | Status: AC

## 2023-01-29 ENCOUNTER — Other Ambulatory Visit: Payer: Self-pay

## 2023-01-29 ENCOUNTER — Inpatient Hospital Stay: Payer: Medicare Other | Admitting: Urgent Care

## 2023-01-29 ENCOUNTER — Encounter: Payer: Self-pay | Admitting: General Surgery

## 2023-01-29 ENCOUNTER — Inpatient Hospital Stay
Admission: RE | Admit: 2023-01-29 | Discharge: 2023-02-02 | DRG: 827 | Disposition: A | Discharge status: Home / Self Care | Payer: Medicare Other | Attending: General Surgery | Admitting: General Surgery

## 2023-01-29 ENCOUNTER — Encounter
Admission: RE | Disposition: A | Discharge status: Home / Self Care | Payer: Self-pay | Source: Home / Self Care | Attending: General Surgery

## 2023-01-29 DIAGNOSIS — Z888 Allergy status to other drugs, medicaments and biological substances status: Secondary | ICD-10-CM | POA: Diagnosis not present

## 2023-01-29 DIAGNOSIS — Z8616 Personal history of COVID-19: Secondary | ICD-10-CM | POA: Diagnosis not present

## 2023-01-29 DIAGNOSIS — Z79899 Other long term (current) drug therapy: Secondary | ICD-10-CM | POA: Diagnosis not present

## 2023-01-29 DIAGNOSIS — Z803 Family history of malignant neoplasm of breast: Secondary | ICD-10-CM | POA: Diagnosis not present

## 2023-01-29 DIAGNOSIS — Z86000 Personal history of in-situ neoplasm of breast: Secondary | ICD-10-CM

## 2023-01-29 DIAGNOSIS — I081 Rheumatic disorders of both mitral and tricuspid valves: Secondary | ICD-10-CM | POA: Diagnosis present

## 2023-01-29 DIAGNOSIS — Z6841 Body Mass Index (BMI) 40.0 and over, adult: Secondary | ICD-10-CM

## 2023-01-29 DIAGNOSIS — C182 Malignant neoplasm of ascending colon: Secondary | ICD-10-CM | POA: Diagnosis present

## 2023-01-29 DIAGNOSIS — Z96653 Presence of artificial knee joint, bilateral: Secondary | ICD-10-CM | POA: Diagnosis present

## 2023-01-29 DIAGNOSIS — M810 Age-related osteoporosis without current pathological fracture: Secondary | ICD-10-CM | POA: Diagnosis present

## 2023-01-29 DIAGNOSIS — Z885 Allergy status to narcotic agent status: Secondary | ICD-10-CM

## 2023-01-29 DIAGNOSIS — C7A8 Other malignant neuroendocrine tumors: Principal | ICD-10-CM | POA: Diagnosis present

## 2023-01-29 DIAGNOSIS — Z5331 Laparoscopic surgical procedure converted to open procedure: Secondary | ICD-10-CM

## 2023-01-29 DIAGNOSIS — Z7901 Long term (current) use of anticoagulants: Secondary | ICD-10-CM

## 2023-01-29 DIAGNOSIS — I4891 Unspecified atrial fibrillation: Secondary | ICD-10-CM | POA: Diagnosis present

## 2023-01-29 DIAGNOSIS — E785 Hyperlipidemia, unspecified: Secondary | ICD-10-CM | POA: Diagnosis present

## 2023-01-29 DIAGNOSIS — Z9012 Acquired absence of left breast and nipple: Secondary | ICD-10-CM

## 2023-01-29 DIAGNOSIS — G4730 Sleep apnea, unspecified: Secondary | ICD-10-CM | POA: Diagnosis present

## 2023-01-29 DIAGNOSIS — Z8249 Family history of ischemic heart disease and other diseases of the circulatory system: Secondary | ICD-10-CM | POA: Diagnosis not present

## 2023-01-29 DIAGNOSIS — E119 Type 2 diabetes mellitus without complications: Secondary | ICD-10-CM

## 2023-01-29 DIAGNOSIS — Z880 Allergy status to penicillin: Secondary | ICD-10-CM | POA: Diagnosis not present

## 2023-01-29 DIAGNOSIS — I10 Essential (primary) hypertension: Secondary | ICD-10-CM | POA: Diagnosis present

## 2023-01-29 DIAGNOSIS — C189 Malignant neoplasm of colon, unspecified: Secondary | ICD-10-CM | POA: Diagnosis present

## 2023-01-29 LAB — GLUCOSE, CAPILLARY
Glucose-Capillary: 135 mg/dL — ABNORMAL HIGH (ref 70–99)
Glucose-Capillary: 218 mg/dL — ABNORMAL HIGH (ref 70–99)

## 2023-01-29 SURGERY — COLECTOMY, RIGHT, ROBOT-ASSISTED
Anesthesia: General

## 2023-01-29 MED ORDER — OXYCODONE HCL 5 MG/5ML PO SOLN: 5.0000 mg | Freq: Once | ORAL | Status: DC | PRN

## 2023-01-29 MED ORDER — KETAMINE HCL 10 MG/ML IJ SOLN
INTRAMUSCULAR | Status: DC | PRN
  Administered 2023-01-29 (×2): 25 mg via INTRAVENOUS

## 2023-01-29 MED ORDER — FAMOTIDINE 20 MG PO TABS
ORAL_TABLET | ORAL | Status: AC
  Filled 2023-01-29: qty 1

## 2023-01-29 MED ORDER — DOXAZOSIN MESYLATE 4 MG PO TABS
4.0000 mg | ORAL_TABLET | Freq: Every day | ORAL | Status: DC
  Administered 2023-01-30 – 2023-02-02 (×4): 4 mg via ORAL
  Filled 2023-01-29 (×4): qty 1

## 2023-01-29 MED ORDER — AMLODIPINE BESYLATE 10 MG PO TABS
10.0000 mg | ORAL_TABLET | Freq: Every day | ORAL | Status: DC
  Administered 2023-01-30 – 2023-02-02 (×4): 10 mg via ORAL
  Filled 2023-01-29 (×4): qty 1

## 2023-01-29 MED ORDER — STERILE WATER FOR INJECTION IV SOLN
INTRAMUSCULAR | Status: DC | PRN
  Administered 2023-01-29: 2 mL

## 2023-01-29 MED ORDER — PROMETHAZINE HCL 25 MG/ML IJ SOLN: 6.2500 mg | INTRAMUSCULAR | Status: DC | PRN

## 2023-01-29 MED ORDER — SODIUM CHLORIDE 0.9 % IV SOLN
INTRAVENOUS | Status: AC
  Filled 2023-01-29: qty 2

## 2023-01-29 MED ORDER — LIDOCAINE HCL (PF) 2 % IJ SOLN
INTRAMUSCULAR | Status: AC
  Filled 2023-01-29: qty 5

## 2023-01-29 MED ORDER — ONDANSETRON HCL 4 MG/2ML IJ SOLN
INTRAMUSCULAR | Status: DC | PRN
  Administered 2023-01-29: 4 mg via INTRAVENOUS

## 2023-01-29 MED ORDER — PHENYLEPHRINE HCL-NACL 20-0.9 MG/250ML-% IV SOLN
INTRAVENOUS | Status: DC | PRN
  Administered 2023-01-29: 10 ug/min via INTRAVENOUS

## 2023-01-29 MED ORDER — DEXAMETHASONE SODIUM PHOSPHATE 10 MG/ML IJ SOLN
INTRAMUSCULAR | Status: DC | PRN
  Administered 2023-01-29: 10 mg via INTRAVENOUS

## 2023-01-29 MED ORDER — FENTANYL CITRATE (PF) 100 MCG/2ML IJ SOLN
INTRAMUSCULAR | Status: DC | PRN
  Administered 2023-01-29: 25 ug via INTRAVENOUS
  Administered 2023-01-29: 75 ug via INTRAVENOUS

## 2023-01-29 MED ORDER — PRAVASTATIN SODIUM 20 MG PO TABS
20.0000 mg | ORAL_TABLET | Freq: Every day | ORAL | Status: DC
  Administered 2023-01-29 – 2023-02-01 (×4): 20 mg via ORAL
  Filled 2023-01-29 (×4): qty 1

## 2023-01-29 MED ORDER — BUPIVACAINE LIPOSOME 1.3 % IJ SUSP
INTRAMUSCULAR | Status: DC | PRN
  Administered 2023-01-29: 20 mL

## 2023-01-29 MED ORDER — GABAPENTIN 300 MG PO CAPS
300.0000 mg | ORAL_CAPSULE | Freq: Two times a day (BID) | ORAL | Status: DC
  Administered 2023-01-29 – 2023-02-02 (×8): 300 mg via ORAL
  Filled 2023-01-29 (×8): qty 1

## 2023-01-29 MED ORDER — PROPOFOL 10 MG/ML IV BOLUS
INTRAVENOUS | Status: DC | PRN
  Administered 2023-01-29: 120 mg via INTRAVENOUS

## 2023-01-29 MED ORDER — KETAMINE HCL 50 MG/5ML IJ SOSY
PREFILLED_SYRINGE | INTRAMUSCULAR | Status: AC
  Filled 2023-01-29: qty 5

## 2023-01-29 MED ORDER — EPHEDRINE SULFATE (PRESSORS) 50 MG/ML IJ SOLN
INTRAMUSCULAR | Status: DC | PRN
  Administered 2023-01-29 (×3): 5 mg via INTRAVENOUS

## 2023-01-29 MED ORDER — PROPOFOL 10 MG/ML IV BOLUS
INTRAVENOUS | Status: AC
  Filled 2023-01-29: qty 20

## 2023-01-29 MED ORDER — EPHEDRINE 5 MG/ML INJ
INTRAVENOUS | Status: AC
  Filled 2023-01-29: qty 5

## 2023-01-29 MED ORDER — ONDANSETRON 4 MG PO TBDP: 4.0000 mg | ORAL_TABLET | Freq: Four times a day (QID) | ORAL | Status: DC | PRN

## 2023-01-29 MED ORDER — ONDANSETRON HCL 4 MG/2ML IJ SOLN: 4.0000 mg | Freq: Four times a day (QID) | INTRAMUSCULAR | Status: DC | PRN

## 2023-01-29 MED ORDER — LOSARTAN POTASSIUM 50 MG PO TABS
100.0000 mg | ORAL_TABLET | Freq: Every day | ORAL | Status: DC
  Administered 2023-01-30 – 2023-02-02 (×4): 100 mg via ORAL
  Filled 2023-01-29 (×4): qty 2

## 2023-01-29 MED ORDER — OXYCODONE HCL 5 MG PO TABS: 5.0000 mg | ORAL_TABLET | Freq: Once | ORAL | Status: DC | PRN

## 2023-01-29 MED ORDER — DROPERIDOL 2.5 MG/ML IJ SOLN: 0.6250 mg | Freq: Once | INTRAMUSCULAR | Status: DC | PRN

## 2023-01-29 MED ORDER — CELECOXIB 200 MG PO CAPS
ORAL_CAPSULE | ORAL | Status: AC
  Filled 2023-01-29: qty 1

## 2023-01-29 MED ORDER — MIDAZOLAM HCL 2 MG/2ML IJ SOLN
INTRAMUSCULAR | Status: DC | PRN
  Administered 2023-01-29: 1 mg via INTRAVENOUS

## 2023-01-29 MED ORDER — LIDOCAINE HCL (CARDIAC) PF 100 MG/5ML IV SOSY
PREFILLED_SYRINGE | INTRAVENOUS | Status: DC | PRN
  Administered 2023-01-29: 60 mg via INTRAVENOUS

## 2023-01-29 MED ORDER — BUPIVACAINE HCL (PF) 0.5 % IJ SOLN
INTRAMUSCULAR | Status: DC | PRN
  Administered 2023-01-29: 30 mL

## 2023-01-29 MED ORDER — MORPHINE SULFATE (PF) 4 MG/ML IV SOLN: 4.0000 mg | INTRAVENOUS | Status: DC | PRN

## 2023-01-29 MED ORDER — ENOXAPARIN SODIUM 60 MG/0.6ML IJ SOSY
50.0000 mg | PREFILLED_SYRINGE | INTRAMUSCULAR | Status: DC
  Administered 2023-01-30 – 2023-02-01 (×3): 50 mg via SUBCUTANEOUS
  Filled 2023-01-29 (×3): qty 0.6

## 2023-01-29 MED ORDER — GLYCOPYRROLATE 0.2 MG/ML IJ SOLN
INTRAMUSCULAR | Status: DC | PRN
  Administered 2023-01-29: .2 mg via INTRAVENOUS

## 2023-01-29 MED ORDER — ATENOLOL 25 MG PO TABS
25.0000 mg | ORAL_TABLET | Freq: Every day | ORAL | Status: DC
  Administered 2023-01-30 – 2023-02-02 (×4): 25 mg via ORAL
  Filled 2023-01-29 (×4): qty 1

## 2023-01-29 MED ORDER — SUGAMMADEX SODIUM 200 MG/2ML IV SOLN
INTRAVENOUS | Status: DC | PRN
  Administered 2023-01-29: 200 mg via INTRAVENOUS

## 2023-01-29 MED ORDER — LACTATED RINGERS IV SOLN: INTRAVENOUS | Status: DC | PRN

## 2023-01-29 MED ORDER — HYDROCHLOROTHIAZIDE 25 MG PO TABS
25.0000 mg | ORAL_TABLET | Freq: Every day | ORAL | Status: DC
  Administered 2023-01-30 – 2023-02-02 (×4): 25 mg via ORAL
  Filled 2023-01-29 (×4): qty 1

## 2023-01-29 MED ORDER — ROCURONIUM BROMIDE 100 MG/10ML IV SOLN
INTRAVENOUS | Status: DC | PRN
  Administered 2023-01-29: 80 mg via INTRAVENOUS
  Administered 2023-01-29: 10 mg via INTRAVENOUS
  Administered 2023-01-29: 20 mg via INTRAVENOUS

## 2023-01-29 MED ORDER — SEVOFLURANE IN SOLN
RESPIRATORY_TRACT | Status: AC
  Filled 2023-01-29: qty 250

## 2023-01-29 MED ORDER — SODIUM CHLORIDE 0.9 % IV SOLN: INTRAVENOUS | Status: DC

## 2023-01-29 MED ORDER — SODIUM CHLORIDE (PF) 0.9 % IJ SOLN
INTRAMUSCULAR | Status: AC
  Filled 2023-01-29: qty 50

## 2023-01-29 MED ORDER — GLYCOPYRROLATE 0.2 MG/ML IJ SOLN
INTRAMUSCULAR | Status: AC
  Filled 2023-01-29: qty 1

## 2023-01-29 MED ORDER — CITALOPRAM HYDROBROMIDE 20 MG PO TABS
20.0000 mg | ORAL_TABLET | Freq: Every day | ORAL | Status: DC
  Administered 2023-01-30 – 2023-02-02 (×4): 20 mg via ORAL
  Filled 2023-01-29 (×4): qty 1

## 2023-01-29 MED ORDER — BUPIVACAINE LIPOSOME 1.3 % IJ SUSP
INTRAMUSCULAR | Status: AC
  Filled 2023-01-29: qty 20

## 2023-01-29 MED ORDER — POTASSIUM CHLORIDE CRYS ER 20 MEQ PO TBCR
20.0000 meq | EXTENDED_RELEASE_TABLET | Freq: Every day | ORAL | Status: DC
  Administered 2023-01-30 – 2023-02-02 (×4): 20 meq via ORAL
  Filled 2023-01-29 (×4): qty 1

## 2023-01-29 MED ORDER — ALVIMOPAN 12 MG PO CAPS
ORAL_CAPSULE | ORAL | Status: AC
  Filled 2023-01-29: qty 1

## 2023-01-29 MED ORDER — ALVIMOPAN 12 MG PO CAPS
12.0000 mg | ORAL_CAPSULE | Freq: Two times a day (BID) | ORAL | Status: DC
  Administered 2023-01-30 – 2023-01-31 (×4): 12 mg via ORAL
  Filled 2023-01-29 (×5): qty 1

## 2023-01-29 MED ORDER — NITROGLYCERIN IN D5W 200-5 MCG/ML-% IV SOLN
INTRAVENOUS | Status: AC
  Filled 2023-01-29: qty 250

## 2023-01-29 MED ORDER — HYDROCODONE-ACETAMINOPHEN 5-325 MG PO TABS
1.0000 | ORAL_TABLET | ORAL | Status: DC | PRN
  Administered 2023-01-30: 2 via ORAL
  Filled 2023-01-29: qty 2

## 2023-01-29 MED ORDER — ACETAMINOPHEN 10 MG/ML IV SOLN: 1000.0000 mg | Freq: Once | INTRAVENOUS | Status: DC | PRN

## 2023-01-29 MED ORDER — CHLORHEXIDINE GLUCONATE 0.12 % MT SOLN
OROMUCOSAL | Status: AC
  Filled 2023-01-29: qty 15

## 2023-01-29 MED ORDER — FENTANYL CITRATE (PF) 100 MCG/2ML IJ SOLN: 25.0000 ug | INTRAMUSCULAR | Status: DC | PRN

## 2023-01-29 MED ORDER — PANTOPRAZOLE SODIUM 40 MG IV SOLR
40.0000 mg | Freq: Every day | INTRAVENOUS | Status: DC
  Administered 2023-01-29 – 2023-02-01 (×4): 40 mg via INTRAVENOUS
  Filled 2023-01-29 (×4): qty 10

## 2023-01-29 MED ORDER — MIDAZOLAM HCL 2 MG/2ML IJ SOLN
INTRAMUSCULAR | Status: AC
  Filled 2023-01-29: qty 2

## 2023-01-29 MED ORDER — BUPIVACAINE HCL (PF) 0.5 % IJ SOLN
INTRAMUSCULAR | Status: AC
  Filled 2023-01-29: qty 30

## 2023-01-29 MED ORDER — PHENYLEPHRINE HCL-NACL 20-0.9 MG/250ML-% IV SOLN
INTRAVENOUS | Status: AC
  Filled 2023-01-29: qty 250

## 2023-01-29 MED ORDER — GABAPENTIN 300 MG PO CAPS
ORAL_CAPSULE | ORAL | Status: AC
  Filled 2023-01-29: qty 1

## 2023-01-29 MED ORDER — FENTANYL CITRATE (PF) 100 MCG/2ML IJ SOLN
INTRAMUSCULAR | Status: AC
  Filled 2023-01-29: qty 2

## 2023-01-29 MED ORDER — ACETAMINOPHEN 500 MG PO TABS
ORAL_TABLET | ORAL | Status: AC
  Filled 2023-01-29: qty 2

## 2023-01-29 SURGICAL SUPPLY — 102 items
ADH SKN CLS APL DERMABOND .7 (GAUZE/BANDAGES/DRESSINGS) ×1
APPLIER CLIP 9.375 SM OPEN (CLIP) ×1
APR CLP SM 9.3 20 MLT OPN (CLIP) ×1
BLADE CLIPPER SURG (BLADE) ×1 IMPLANT
BLADE SURG SZ10 CARB STEEL (BLADE) ×1 IMPLANT
BLADE SURG SZ11 CARB STEEL (BLADE) ×1 IMPLANT
CANNULA REDUC XI 12-8 STAPL (CANNULA) ×1
CANNULA REDUCER 12-8 DVNC XI (CANNULA) ×1 IMPLANT
CLIP APPLIE 9.375 SM OPEN (CLIP) IMPLANT
CLIP LIGATING HEMO O LOK GREEN (MISCELLANEOUS) ×1 IMPLANT
COVER TIP SHEARS 8 DVNC (MISCELLANEOUS) ×1 IMPLANT
COVER TIP SHEARS 8MM DA VINCI (MISCELLANEOUS) ×1
DERMABOND ADVANCED .7 DNX12 (GAUZE/BANDAGES/DRESSINGS) ×1 IMPLANT
DRAPE ARM DVNC X/XI (DISPOSABLE) ×4 IMPLANT
DRAPE COLUMN DVNC XI (DISPOSABLE) ×1 IMPLANT
DRAPE DA VINCI XI ARM (DISPOSABLE) ×4
DRAPE DA VINCI XI COLUMN (DISPOSABLE) ×1
DRSG OPSITE POSTOP 3X4 (GAUZE/BANDAGES/DRESSINGS) IMPLANT
DRSG OPSITE POSTOP 4X10 (GAUZE/BANDAGES/DRESSINGS) IMPLANT
DRSG OPSITE POSTOP 4X8 (GAUZE/BANDAGES/DRESSINGS) IMPLANT
ELECT BLADE 6.5 EXT (BLADE) IMPLANT
ELECT CAUTERY BLADE 6.4 (BLADE) IMPLANT
ELECT REM PT RETURN 9FT ADLT (ELECTROSURGICAL) ×1
ELECTRODE REM PT RTRN 9FT ADLT (ELECTROSURGICAL) ×1 IMPLANT
FORCEPS BPLR R/ABLATION 8 DVNC (INSTRUMENTS) ×1 IMPLANT
GLOVE BIO SURGEON STRL SZ 6.5 (GLOVE) ×3 IMPLANT
GLOVE BIOGEL PI IND STRL 6.5 (GLOVE) ×3 IMPLANT
GOWN STRL REUS W/ TWL LRG LVL3 (GOWN DISPOSABLE) ×6 IMPLANT
GOWN STRL REUS W/TWL LRG LVL3 (GOWN DISPOSABLE) ×6
GRASPER TIP-UP FEN DVNC XI (INSTRUMENTS) ×1 IMPLANT
GRASPER XI TIP-UP FEN DA VINCI (INSTRUMENTS) ×1
HANDLE YANKAUER SUCT BULB TIP (MISCELLANEOUS) ×1 IMPLANT
IRRIGATION STRYKERFLOW (MISCELLANEOUS) IMPLANT
IRRIGATOR STRYKERFLOW (MISCELLANEOUS)
IRRIGATOR SUCT 8 DISP DVNC XI (IRRIGATION / IRRIGATOR) IMPLANT
IRRIGATOR SUCTION 8MM XI DISP (IRRIGATION / IRRIGATOR)
IV NS 1000ML (IV SOLUTION)
IV NS 1000ML BAXH (IV SOLUTION) IMPLANT
KIT IMAGING PINPOINTPAQ (MISCELLANEOUS) IMPLANT
KIT PINK PAD W/HEAD ARE REST (MISCELLANEOUS) ×1
KIT PINK PAD W/HEAD ARM REST (MISCELLANEOUS) ×1 IMPLANT
LABEL OR SOLS (LABEL) IMPLANT
LIGASURE IMPACT 36 18CM CVD LR (INSTRUMENTS) IMPLANT
MANIFOLD NEPTUNE II (INSTRUMENTS) ×1 IMPLANT
NDL DRIVE SUT CUT DVNC (INSTRUMENTS) ×1 IMPLANT
NDL HYPO 22X1.5 SAFETY MO (MISCELLANEOUS) ×1 IMPLANT
NDL INSUFFLATION 14GA 120MM (NEEDLE) ×1 IMPLANT
NEEDLE DRIVE SUT CUT DVNC (INSTRUMENTS) IMPLANT
NEEDLE HYPO 22X1.5 SAFETY MO (MISCELLANEOUS) ×1 IMPLANT
NEEDLE INSUFFLATION 14GA 120MM (NEEDLE) ×1 IMPLANT
OBTURATOR OPTICAL STANDARD 8MM (TROCAR) ×1
OBTURATOR OPTICAL STND 8 DVNC (TROCAR) ×1
OBTURATOR OPTICALSTD 8 DVNC (TROCAR) ×1 IMPLANT
PACK COLON CLEAN CLOSURE (MISCELLANEOUS) ×1 IMPLANT
PACK LAP CHOLECYSTECTOMY (MISCELLANEOUS) ×1 IMPLANT
PORT ACCESS TROCAR AIRSEAL 5 (TROCAR) ×1 IMPLANT
RELOAD PROXIMATE 75MM BLUE (ENDOMECHANICALS) ×3 IMPLANT
RELOAD STAPLE 45 3.5 BLU DVNC (STAPLE) IMPLANT
RELOAD STAPLE 60 3.5 BLU DVNC (STAPLE) IMPLANT
RELOAD STAPLE 75 3.8 BLU REG (ENDOMECHANICALS) IMPLANT
RELOAD STAPLER 3.5X45 BLU DVNC (STAPLE) IMPLANT
RELOAD STAPLER 3.5X60 BLU DVNC (STAPLE) IMPLANT
RETRACTOR WOUND ALXS 18CM SML (MISCELLANEOUS) IMPLANT
RTRCTR WOUND ALEXIS O 18CM SML (MISCELLANEOUS)
SCISSORS MNPLR CVD DVNC XI (INSTRUMENTS) ×1 IMPLANT
SCISSORS XI MNPLR CVD DVNC (INSTRUMENTS) ×1
SEAL UNIV 5-12 XI (MISCELLANEOUS) ×3 IMPLANT
SEAL XI UNIVERSAL 5-12 (MISCELLANEOUS) ×3
SEALER VESSEL DA VINCI XI (MISCELLANEOUS) ×1
SEALER VESSEL EXT DVNC XI (MISCELLANEOUS) IMPLANT
SET TRI-LUMEN FLTR TB AIRSEAL (TUBING) ×1 IMPLANT
SOL ELECTROSURG ANTI STICK (MISCELLANEOUS) ×1
SOLUTION ELECTROSURG ANTI STCK (MISCELLANEOUS) ×1 IMPLANT
SPONGE T-LAP 18X18 ~~LOC~~+RFID (SPONGE) ×1 IMPLANT
SPONGE T-LAP 4X18 ~~LOC~~+RFID (SPONGE) ×1 IMPLANT
STAPLER 45 DA VINCI SURE FORM (STAPLE)
STAPLER 45 SUREFORM DVNC (STAPLE) IMPLANT
STAPLER 60 DA VINCI SURE FORM (STAPLE)
STAPLER 60 SUREFORM DVNC (STAPLE) IMPLANT
STAPLER PROXIMATE 75MM BLUE (STAPLE) IMPLANT
STAPLER RELOAD 3.5X45 BLU DVNC (STAPLE)
STAPLER RELOAD 3.5X45 BLUE (STAPLE)
STAPLER RELOAD 3.5X60 BLU DVNC (STAPLE)
STAPLER RELOAD 3.5X60 BLUE (STAPLE)
STAPLER SKIN PROX 35W (STAPLE) IMPLANT
SUT MNCRL 4-0 (SUTURE) ×2
SUT MNCRL 4-0 27XMFL (SUTURE) ×2
SUT PDS AB 1 CT1 27 (SUTURE) IMPLANT
SUT SILK 3 0 SH 30 (SUTURE) IMPLANT
SUT STRATAFIX 0 PDS+ CT-2 23 (SUTURE) ×1
SUT V-LOC 90 ABS 3-0 VLT  V-20 (SUTURE) ×1
SUT V-LOC 90 ABS 3-0 VLT V-20 (SUTURE) ×1 IMPLANT
SUT VIC AB 3-0 SH 27 (SUTURE) ×1
SUT VIC AB 3-0 SH 27X BRD (SUTURE) ×1 IMPLANT
SUT VICRYL 0 UR6 27IN ABS (SUTURE) IMPLANT
SUT VLOC 90 S/L VL9 GS22 (SUTURE) IMPLANT
SUTURE MNCRL 4-0 27XMF (SUTURE) ×2 IMPLANT
SUTURE STRATFX 0 PDS+ CT-2 23 (SUTURE) ×1 IMPLANT
SYR 30ML LL (SYRINGE) ×1 IMPLANT
TRAP FLUID SMOKE EVACUATOR (MISCELLANEOUS) ×1 IMPLANT
TRAY FOLEY MTR SLVR 16FR STAT (SET/KITS/TRAYS/PACK) ×1 IMPLANT
WATER STERILE IRR 500ML POUR (IV SOLUTION) ×1 IMPLANT

## 2023-01-29 NOTE — Transfer of Care (Signed)
Immediate Anesthesia Transfer of Care Note  Patient: Dominique Moore  Procedure(s) Performed: XI ROBOT ASSISTED RIGHT COLECTOMY  Patient Location: PACU  Anesthesia Type:General  Level of Consciousness: drowsy  Airway & Oxygen Therapy: Patient Spontanous Breathing and Patient connected to face mask oxygen  Post-op Assessment: Report given to RN and Post -op Vital signs reviewed and stable  Post vital signs: Reviewed and stable  Last Vitals:  Vitals Value Taken Time  BP 102/52   Temp    Pulse 54 01/29/23 1534  Resp 13   SpO2 90 % 01/29/23 1534  Vitals shown include unvalidated device data.  Last Pain:  Vitals:   01/29/23 1005  TempSrc: Oral         Complications: No notable events documented.

## 2023-01-29 NOTE — Anesthesia Procedure Notes (Signed)
Procedure Name: Intubation Date/Time: 01/29/2023 10:36 AM  Performed by: Meklit Cotta, [place], CRNAPre-anesthesia Checklist: Patient identified, Patient being monitored, Timeout performed, Emergency Drugs available and Suction available Patient Re-evaluated:Patient Re-evaluated prior to induction Oxygen Delivery Method: Circle system utilized Preoxygenation: Pre-oxygenation with 100% oxygen Induction Type: IV induction Ventilation: Mask ventilation without difficulty Laryngoscope Size: 3 and McGraph Grade View: Grade I Tube type: Oral Tube size: 7.0 mm Number of attempts: 1 Airway Equipment and Method: Stylet Placement Confirmation: ETT inserted through vocal cords under direct vision, positive ETCO2 and breath sounds checked- equal and bilateral Secured at: 22 cm Tube secured with: Tape Dental Injury: Teeth and Oropharynx as per pre-operative assessment

## 2023-01-29 NOTE — Op Note (Signed)
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   Anesthesia: GETA   Surgeon: Carolan Shiver, MD  Assistant: None    Wound Classification: clean contaminated   Specimen: Right colon   Complications: None   Estimated Blood Loss: 100 mL   Indications: Patient is a 82 y.o. female with large mass on terminal ileum with PET dotatate scan positive for neuroendocrine tumor.    FIndings: 1.  Large terminal ileum mass adhere to cecum and anterior abdominal wall.  2.  Adequate hemostasis.  3. Abundant amount of adhesions of omental fat, intestine to mass.  4.  No gross metastasis noted   Description of procedure: The patient was placed on the operating table in the supine position, both arms tucked. General anesthesia was induced. A time-out was completed verifying correct patient, procedure, site, positioning, and implant(s) and/or special equipment prior to beginning this procedure. The abdomen was prepped and draped in the usual sterile fashion.    Veress needle was inserted in the Palmer's point.  Gas insufflation was initiated until the abdominal pressure was measured at 15 mmHg.  A small incision was done in the left upper quadrant.  Access to the abdominal cavity was done in the Optiview fashion.  Afterwards, 3 additional incision was made 5 cm apart along the left side of the abdominal wall from the initial incision and two additional 8mm port and one 12 mm port were placed under direct visualization. 5 mm assistant port was then placed between 2 of the robotic ports.  No injuries from trocar placements were noted. Exparel infused on the bilateral abdominal wall. The table was placed in the reverse Trendelenburg position with the right side elevated.  Xi robotic platform was then brought to the operative field and docked at an angle from the left  lower quadrant.  Tip up grasper and scissors was placed in right arm ports.  Fenestrated bipolar in left arm port.   Examination of the abdominal cavity noted no signs of gross metastasis.  There was abundant amount of adhesions of the mass to omental fat, abdominal wall, mesentery and intestine. Difficult and time consuming lysis of adhesion was done to freed the terminal ileum and cecum. The posterior fascia of the rectus muscle was dissected with the mass to try to get R0 resection. Once the large mass was dissected, it was so large and heavy that it was difficult to manipulate with laparoscopic instruments. It was decided to convert to open right colectomy.   A vertical midline incision was made. This was deepened through the subcutaneous tissues and hemostasis was achieved with electrocautery. The linea alba was identified and incised and the peritoneal cavity entered.  A mass was palpated in the terminal ileum and cecum. The liver, omentum, and peritoneum, were inspected for the evidence of metastatic disease. No metastatic disease was noted.  The small bowel was inspected and retracted to the left using a moist gauze and self-retaining retractor. Using electrocautery, the colon was freed from its peritoneal attachments along the avascular line of Toldt from the cecum to the hepatic flexure. Additional lateral peritoneal coverings were incised to further mobilize the colon. The dissection was extended across the ileocolic junction and terminal ileum was mobilized. Both ureters were identified and protected, as were the duodenum, right kidney, and gonadal vessels. The hepatic flexure was carefully mobilized by dividing the peritoneum in the hepatorenal fossa.  Points of  transection were selected proximally and distally. The bowel was divided with the linear cutting stapler. The peritoneum overlying the mesentery was then scored with electrocautery and divided with LigaSure. The right branch/main trunk of  the middle colic and right colic arteries were similarly identified and ligated. Remaining mesentery and all associated nodal tissue was divided and swept down with the specimen. The specimen was removed, proximal and distal ends tagged, and sent to pathology. Hemostasis was checked in the operative field. The two ends of bowel were checked for perfusion with ICG green and the end of the terminal ileum was found with poor perfusion. It was decided to resect a segment of the terminal ileum were good adequate ICG green perfusion was identified. The proximal and distal segments were brought into apposition and found to lie comfortably next to each other. Two stay sutures of 3-0 silk were placed to approximate the antimesenteric borders of the bowel segments. Enterotomies were made on the antimesenteric corner of the staple line on the ileum and transverse colon and the linear cutting stapler inserted and fired. Hemostasis was checked on the staple line. The enterotomies were then closed with 3-0 V lock. The anastomosis was checked and found to be intact and widely patent. Mesenteric defect was closed with interrupted 3-0 Vicryl. The abdominal cavity was then copiously irrigated and hemostasis was checked.  Using a clean closure technique, the fascia was closed with a running suture of PDS 0. The skin was closed with staples.  The patient tolerated the procedure well and was taken to the postanesthesia care unit in stable condition.

## 2023-01-29 NOTE — Interval H&P Note (Signed)
History and Physical Interval Note:  01/29/2023 9:43 AM  Dominique Moore  has presented today for surgery, with the diagnosis of K63.89 small bowel mass.  The various methods of treatment have been discussed with the patient and family. After consideration of risks, benefits and other options for treatment, the patient has consented to  Procedure(s): XI ROBOT ASSISTED RIGHT COLECTOMY (N/A) as a surgical intervention.  The patient's history has been reviewed, patient examined, no change in status, stable for surgery.  I have reviewed the patient's chart and labs.  Questions were answered to the patient's satisfaction.     Carolan Shiver

## 2023-01-29 NOTE — Anesthesia Preprocedure Evaluation (Signed)
Anesthesia Evaluation  Patient identified by MRN, date of birth, ID band Patient awake    Reviewed: Allergy & Precautions, H&P , NPO status , Patient's Chart, lab work & pertinent test results, reviewed documented beta blocker date and time   History of Anesthesia Complications (+) PONV and history of anesthetic complications  Airway Mallampati: II  TM Distance: >3 FB Neck ROM: full    Dental  (+) Teeth Intact, Poor Dentition, Dental Advidsory Given, Missing   Pulmonary shortness of breath, sleep apnea    Pulmonary exam normal        Cardiovascular Exercise Tolerance: Poor hypertension, On Medications negative cardio ROS Atrial Fibrillation  Rhythm:regular Rate:Normal     Neuro/Psych  PSYCHIATRIC DISORDERS Anxiety Depression    negative neurological ROS     GI/Hepatic negative GI ROS, Neg liver ROS,,,  Endo/Other  negative endocrine ROSdiabetes, Well Controlled    Renal/GU negative Renal ROS  negative genitourinary   Musculoskeletal   Abdominal   Peds  Hematology negative hematology ROS (+)   Anesthesia Other Findings Past Medical History: No date: A-fib No date: Anemia No date: Anxiety 2000: Breast cancer     Comment:  left breast lumpectomy with rad tx No date: Complication of anesthesia No date: Depression No date: Diabetes mellitus without complication No date: Dyspnea No date: Hyperlipidemia No date: Hypertension No date: Osteoporosis No date: Personal history of radiation therapy No date: PONV (postoperative nausea and vomiting)     Comment:  with breast surgery No date: Pre-diabetes No date: Sleep apnea     Comment:  no CPAP Past Surgical History: 2000: BREAST BIOPSY; Left     Comment:  positive No date: BREAST LUMPECTOMY No date: CATARACT EXTRACTION; Bilateral 02/22/2016: COLONOSCOPY WITH PROPOFOL; N/A     Comment:  Procedure: COLONOSCOPY WITH PROPOFOL;  Surgeon: Scot Jun, MD;  Location: [facility] ENDOSCOPY;  Service:               Endoscopy;  Laterality: N/A; 10/21/2022: COLONOSCOPY WITH PROPOFOL; N/A     Comment:  Procedure: COLONOSCOPY WITH PROPOFOL;  Surgeon: Toney Reil, MD;  Location: [facility] ENDOSCOPY;  Service:               Gastroenterology;  Laterality: N/A; No date: EYE SURGERY     Comment:  bilateral catracts No date: JOINT REPLACEMENT 08/27/2017: KNEE ARTHROPLASTY; Right     Comment:  Procedure: COMPUTER ASSISTED TOTAL KNEE ARTHROPLASTY;                Surgeon: Donato Heinz, MD;  Location: [facility] ORS;                Service: Orthopedics;  Laterality: Right; 09/20/2020: KNEE ARTHROPLASTY; Left     Comment:  Procedure: COMPUTER ASSISTED TOTAL KNEE ARTHROPLASTY;                Surgeon: Donato Heinz, MD;  Location: [facility] ORS;                Service: Orthopedics;  Laterality: Left;   Reproductive/Obstetrics negative OB ROS                             Anesthesia Physical Anesthesia Plan  ASA: 3  Anesthesia Plan: General ETT   Post-op Pain Management:  Induction:   PONV Risk Score and Plan:   Airway Management Planned:   Additional Equipment:   Intra-op Plan:   Post-operative Plan:   Informed Consent: I have reviewed the patients History and Physical, chart, labs and discussed the procedure including the risks, benefits and alternatives for the proposed anesthesia with the patient or authorized representative who has indicated his/her understanding and acceptance.     Dental Advisory Given  Plan Discussed with: CRNA  Anesthesia Plan Comments:        Anesthesia Quick Evaluation

## 2023-01-29 NOTE — Progress Notes (Signed)
Attempted to contact patient's husband. No answer and voicemail was not set up.   Madie Reno, RN

## 2023-01-30 LAB — CBC
HCT: 32 % — ABNORMAL LOW (ref 36.0–46.0)
Hemoglobin: 10.5 g/dL — ABNORMAL LOW (ref 12.0–15.0)
MCH: 28 pg (ref 26.0–34.0)
MCHC: 32.8 g/dL (ref 30.0–36.0)
MCV: 85.3 fL (ref 80.0–100.0)
Platelets: 158 10*3/uL (ref 150–400)
RBC: 3.75 MIL/uL — ABNORMAL LOW (ref 3.87–5.11)
RDW: 13.4 % (ref 11.5–15.5)
WBC: 11.7 10*3/uL — ABNORMAL HIGH (ref 4.0–10.5)
nRBC: 0 % (ref 0.0–0.2)

## 2023-01-30 LAB — BASIC METABOLIC PANEL
Anion gap: 8 (ref 5–15)
BUN: 15 mg/dL (ref 8–23)
CO2: 24 mmol/L (ref 22–32)
Calcium: 8 mg/dL — ABNORMAL LOW (ref 8.9–10.3)
Chloride: 105 mmol/L (ref 98–111)
Creatinine, Ser: 0.88 mg/dL (ref 0.44–1.00)
GFR, Estimated: >60 mL/min (ref >60)
Glucose, Bld: 182 mg/dL — ABNORMAL HIGH (ref 70–99)
Potassium: 3.3 mmol/L — ABNORMAL LOW (ref 3.5–5.1)
Sodium: 137 mmol/L (ref 135–145)

## 2023-01-30 NOTE — Progress Notes (Signed)
Patient has had a good shift. She was out of bed, standing at the bedside and sitting up to the recliner this afternoon.   Cornell Barman Signe Tackitt

## 2023-01-30 NOTE — TOC Initial Note (Signed)
Transition of Care [facility]) - Initial/Assessment Note    Patient Details  Name: Dominique Moore MRN: [id number] Date of Birth: 03/05/1941  Transition of Care [facility]) CM/SW Contact:    Chapman Fitch, RN Phone Number: 01/30/2023, 11:27 AM  Clinical Narrative:                   Transition of Care [facility]) Screening Note   Patient Details  Name: Dominique Moore Date of Birth: [birth year]   Transition of Care [facility]) CM/SW Contact:    Chapman Fitch, RN Phone Number: 01/30/2023, 11:27 AM    Transition of Care Department [facility]) has reviewed patient and no TOC needs have been identified at this time. We will continue to monitor patient advancement through interdisciplinary progression rounds. If new patient transition needs arise, please place a TOC consult.         Patient Goals and CMS Choice            Expected Discharge Plan and Services                                              Prior Living Arrangements/Services                       Activities of Daily Living Home Assistive Devices/Equipment: Cane (specify quad or straight) ADL Screening (condition at time of admission) Patient's cognitive ability adequate to safely complete daily activities?: Yes Is the patient deaf or have difficulty hearing?: No Does the patient have difficulty seeing, even when wearing glasses/contacts?: No Does the patient have difficulty concentrating, remembering, or making decisions?: No Patient able to express need for assistance with ADLs?: No Does the patient have difficulty dressing or bathing?: No Independently performs ADLs?: Yes (appropriate for developmental age) Does the patient have difficulty walking or climbing stairs?: Yes Weakness of Legs: None Weakness of Arms/Hands: None  Permission Sought/Granted                  Emotional Assessment              Admission diagnosis:  Colon cancer [C18.9] Patient Active Problem List   Diagnosis Date  Noted   Colon cancer 01/29/2023   Goals of care, counseling/discussion 12/27/2022   Hypokalemia 11/29/2022   Ischemic colitis 10/21/2022   RLQ abdominal pain 10/21/2022   Gaseous abdominal distention 10/18/2022   Right-sided abdominal pain of unknown etiology 10/18/2022   Sepsis 10/17/2022   Hypertension 10/17/2022   Personal history of radiation therapy 10/17/2022   Depression 10/17/2022   Small bowel mass 10/17/2022   History of breast cancer 10/17/2022   Right flank pain 10/17/2022   Chronic pulmonary hypertension 03/14/2022   Paroxysmal A-fib 02/13/2022   Total knee replacement status 09/20/2020   Obesity, Class III, BMI 40-49.9 (morbid obesity) 05/19/2019   Type 2 diabetes, diet controlled 09/21/2018   Status post total right knee replacement 08/27/2017   PCP:  Gracelyn Nurse, MD Pharmacy:   [facility] DELIVERY - [address] [place] - [address] [place] [address] Phone: [phone] Fax: [phone]  [facility] [address], [place] - [address] [place] [address] Phone: [phone] Fax: [phone]     Social Determinants of Health (SDOH) Social History: SDOH Screenings   Food  Insecurity: No Food Insecurity (10/18/2022)  Housing: Low Risk  (10/18/2022)  Transportation Needs: No Transportation Needs (10/18/2022)  Utilities: Not At Risk (10/18/2022)  Alcohol Screen: Low Risk  (11/29/2022)  Depression (PHQ2-9): Low Risk  (11/29/2022)  Financial Resource Strain: Low Risk  (11/29/2022)  Stress: No Stress Concern Present (11/29/2022)  Tobacco Use: Low Risk  (01/29/2023)   SDOH Interventions:     Readmission Risk Interventions     No data to display

## 2023-01-30 NOTE — Progress Notes (Signed)
Patient ID: Dominique Moore, female   DOB: 1941/09/23, 82 y.o.   MRN: [id number]     SURGICAL PROGRESS NOTE   Hospital Day(s): 1.   Interval History: Patient seen and examined, no acute events or new complaints overnight. Patient reports feeling well.  She has suspected shortness of breath controlled with the medication.  She denies any nausea or vomiting.  She still not passing gas.  Vital signs in last 24 hours: [min-max] current  Temp:  [97 F (36.1 C)-98.9 F (37.2 C)] 98.9 F (37.2 C) (04/11 1606) Pulse Rate:  [53-65] 64 (04/11 1606) Resp:  [11-18] 18 (04/11 1606) BP: (111-134)/(54-86) 120/54 (04/11 1606) SpO2:  [90 %-97 %] 95 % (04/11 1606)             Physical Exam:  Constitutional: alert, cooperative and no distress  Respiratory: breathing non-labored at rest  Cardiovascular: regular rate and sinus rhythm  Gastrointestinal: soft, non-tender, and mild-distended  Labs:     Latest Ref Rng & Units 01/30/2023    5:32 AM 11/29/2022   12:56 PM 10/19/2022    4:01 PM  CBC  WBC 4.0 - 10.5 K/uL 11.7  4.5  15.4   Hemoglobin 12.0 - 15.0 g/dL 26.4  15.8  30.9   Hematocrit 36.0 - 46.0 % 32.0  35.3  35.3   Platelets 150 - 400 K/uL 158  194  187       Latest Ref Rng & Units 01/30/2023    5:32 AM 01/22/2023    1:48 PM 11/29/2022   12:56 PM  CMP  Glucose 70 - 99 mg/dL 407   680   BUN 8 - 23 mg/dL 15   12   Creatinine 8.81 - 1.00 mg/dL 1.03   1.59   Sodium 458 - 145 mmol/L 137   135   Potassium 3.5 - 5.1 mmol/L 3.3  3.3  3.1   Chloride 98 - 111 mmol/L 105   96   CO2 22 - 32 mmol/L 24   29   Calcium 8.9 - 10.3 mg/dL 8.0   8.9   Total Protein 6.5 - 8.1 g/dL   7.5   Total Bilirubin 0.3 - 1.2 mg/dL   0.7   Alkaline Phos 38 - 126 U/L   66   AST 15 - 41 U/L   25   ALT 0 - 44 U/L   19     Imaging studies: No new pertinent imaging studies   Assessment/Plan:  82 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  -Vital sign has  been stable.  There has been no fever or tachycardia. -Pain is controlled with current medications -Patient still not passing gas.  Will continue Entereg -Continue cardiac diet until adequate GI function -Encourage patient to ambulate -Continue DVT prophylaxis -Will monitor hemoglobin.  He is no sign of bleeding we will consider restarting anticoagulation.  Gae Gallop, MD

## 2023-01-31 LAB — BASIC METABOLIC PANEL
Anion gap: 7 (ref 5–15)
BUN: 13 mg/dL (ref 8–23)
CO2: 26 mmol/L (ref 22–32)
Calcium: 8.1 mg/dL — ABNORMAL LOW (ref 8.9–10.3)
Chloride: 107 mmol/L (ref 98–111)
Creatinine, Ser: 0.82 mg/dL (ref 0.44–1.00)
GFR, Estimated: >60 mL/min (ref >60)
Glucose, Bld: 131 mg/dL — ABNORMAL HIGH (ref 70–99)
Potassium: 3.2 mmol/L — ABNORMAL LOW (ref 3.5–5.1)
Sodium: 140 mmol/L (ref 135–145)

## 2023-01-31 LAB — CBC
HCT: 31.2 % — ABNORMAL LOW (ref 36.0–46.0)
Hemoglobin: 9.8 g/dL — ABNORMAL LOW (ref 12.0–15.0)
MCH: 27.6 pg (ref 26.0–34.0)
MCHC: 31.4 g/dL (ref 30.0–36.0)
MCV: 87.9 fL (ref 80.0–100.0)
Platelets: 155 10*3/uL (ref 150–400)
RBC: 3.55 MIL/uL — ABNORMAL LOW (ref 3.87–5.11)
RDW: 13.9 % (ref 11.5–15.5)
WBC: 10.3 10*3/uL (ref 4.0–10.5)
nRBC: 0 % (ref 0.0–0.2)

## 2023-01-31 NOTE — Progress Notes (Signed)
Patient ID: Dominique Moore, female   DOB: [birth year], 82 y.o.   MRN: [id number]     SURGICAL PROGRESS NOTE   Hospital Day(s): 2.   Interval History: Patient seen and examined, no acute events or new complaints overnight. Patient reports feeling better with less soreness. Still not passing gas. Denies nausea or vomiting  Vital signs in last 24 hours: [min-max] current  Temp:  [98.2 F (36.8 C)-98.9 F (37.2 C)] 98.2 F (36.8 C) (04/12 0415) Pulse Rate:  [64-98] 98 (04/12 0415) Resp:  [18-20] 20 (04/12 0415) BP: (119-129)/(54-65) 119/65 (04/12 0415) SpO2:  [86 %-95 %] 95 % (04/12 0415)             Physical Exam:  Constitutional: alert, cooperative and no distress  Respiratory: breathing non-labored at rest  Cardiovascular: regular rate and sinus rhythm  Gastrointestinal: soft, non-tender, and distended  Labs:     Latest Ref Rng & Units 01/31/2023    3:49 AM 01/30/2023    5:32 AM 11/29/2022   12:56 PM  CBC  WBC 4.0 - 10.5 K/uL 10.3  11.7  4.5   Hemoglobin 12.0 - 15.0 g/dL 9.8  92.1  19.4   Hematocrit 36.0 - 46.0 % 31.2  32.0  35.3   Platelets 150 - 400 K/uL 155  158  194       Latest Ref Rng & Units 01/31/2023    3:49 AM 01/30/2023    5:32 AM 01/22/2023    1:48 PM  CMP  Glucose 70 - 99 mg/dL 174  081    BUN 8 - 23 mg/dL 13  15    Creatinine 4.48 - 1.00 mg/dL 1.85  6.31    Sodium 497 - 145 mmol/L 140  137    Potassium 3.5 - 5.1 mmol/L 3.2  3.3  3.3   Chloride 98 - 111 mmol/L 107  105    CO2 22 - 32 mmol/L 26  24    Calcium 8.9 - 10.3 mg/dL 8.1  8.0      Imaging studies: No new pertinent imaging studies   Assessment/Plan:  82 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   -Stable vitals without fever or tachycardia -Pain improving. Feeling comfortable -Still with expected post operative ileus -Not passing gas. Will continue on clear liquid diet. Continue Entereg -Encourage the patient to ambulate -Continue  DVT prophylaxis.    Gae Gallop, MD

## 2023-01-31 NOTE — Care Management Important Message (Signed)
Important Message  Patient Details  Name: Dominique Moore MRN: [id number] Date of Birth: 1941/04/24   Medicare Important Message Given:  N/A - LOS <3 / Initial given by admissions     Johnell Comings 01/31/2023, 10:52 AM

## 2023-02-01 MED ORDER — ORAL CARE MOUTH RINSE: 15.0000 mL | OROMUCOSAL | Status: DC | PRN

## 2023-02-01 MED ORDER — APIXABAN 5 MG PO TABS
5.0000 mg | ORAL_TABLET | Freq: Two times a day (BID) | ORAL | Status: DC
  Administered 2023-02-01 – 2023-02-02 (×3): 5 mg via ORAL
  Filled 2023-02-01 (×3): qty 1

## 2023-02-01 NOTE — Progress Notes (Signed)
Patient ID: Dominique Moore, female   DOB: November 11, 1940, 82 y.o.   MRN: [id number]     SURGICAL PROGRESS NOTE   Hospital Day(s): 3.   Interval History: Patient seen and examined, no acute events or new complaints overnight. Patient reports feeling well this morning.  She endorsed that she had another big bowel movement this morning.  She also endorses continued passing gas.  She denies any nausea or vomiting with the full liquid.  She had a good breakfast.  Denies any abdominal pain.  Vital signs in last 24 hours: [min-max] current  Temp:  [98.1 F (36.7 C)-98.6 F (37 C)] 98.5 F (36.9 C) (04/13 0832) Pulse Rate:  [53-91] 53 (04/13 0832) Resp:  [17-20] 18 (04/13 0832) BP: (107-132)/(62-87) 127/71 (04/13 0832) SpO2:  [91 %-94 %] 91 % (04/13 3748)             Physical Exam:  Constitutional: alert, cooperative and no distress  Respiratory: breathing non-labored at rest  Cardiovascular: regular rate and sinus rhythm  Gastrointestinal: soft, non-tender, and non-distended.  The wounds are dry and clean.  Labs:     Latest Ref Rng & Units 01/31/2023    3:49 AM 01/30/2023    5:32 AM 11/29/2022   12:56 PM  CBC  WBC 4.0 - 10.5 K/uL 10.3  11.7  4.5   Hemoglobin 12.0 - 15.0 g/dL 9.8  27.0  78.6   Hematocrit 36.0 - 46.0 % 31.2  32.0  35.3   Platelets 150 - 400 K/uL 155  158  194       Latest Ref Rng & Units 01/31/2023    3:49 AM 01/30/2023    5:32 AM 01/22/2023    1:48 PM  CMP  Glucose 70 - 99 mg/dL 754  492    BUN 8 - 23 mg/dL 13  15    Creatinine 0.10 - 1.00 mg/dL 0.71  2.19    Sodium 758 - 145 mmol/L 140  137    Potassium 3.5 - 5.1 mmol/L 3.2  3.3  3.3   Chloride 98 - 111 mmol/L 107  105    CO2 22 - 32 mmol/L 26  24    Calcium 8.9 - 10.3 mg/dL 8.1  8.0      Imaging studies: No new pertinent imaging studies   Assessment/Plan:  82 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   Patient endorses feeling well.  She has  adequate vital signs.  No fever no tachycardia. -Abdominal physical exam benign. -She continued having bowel movements and passing gas.  Will advance diet to soft diet -Patient ambulating adequately -Will we will restart Eliquis as home medication for A-fib -Will assess diet toleration and possible discharge tomorrow is no clinical deterioration.  Gae Gallop, MD

## 2023-02-01 NOTE — Progress Notes (Signed)
Mobility Specialist - Progress Note     02/01/23 1600  Mobility  Activity Stood at bedside;Dangled on edge of bed;Ambulated with assistance in hallway  Level of Assistance Modified independent, requires aide device or extra time  Assistive Device Front wheel walker  Distance Ambulated (ft) 160 ft  Range of Motion/Exercises Active  Activity Response Tolerated well  Mobility Referral Yes  $Mobility charge 1 Mobility    Pt resting in bed on RA upon entry. Pt STS and ambulates around NS for 1 lap with RW ModI. Pt expressed sob toward the end of the mobility session. Pt returned to bed and left with needs in reach.   Johnathan Hausen Mobility Specialist 02/01/23, 4:11 PM

## 2023-02-01 NOTE — Progress Notes (Signed)
Mobility Specialist - Progress Note    02/01/23 1622  Mobility  Activity Ambulated independently to bathroom  Level of Assistance Modified independent, requires aide device or extra time  Assistive Device Front wheel walker  Distance Ambulated (ft) 10 ft  Range of Motion/Exercises Active  Activity Response Tolerated well  Mobility Referral Yes  $Mobility charge 1 Mobility   Pt resting in bed on RA upon entry. Pt STS and ambulates to bathroom ModI with RW. Pt returned to bed and left with needs in reach.    Dominique Moore Mobility Specialist 02/01/23, 4:27 PM

## 2023-02-01 NOTE — Progress Notes (Signed)
Patient tolerating food/drink. Order received from Dr Hazle Quant to discontinue ivf.

## 2023-02-02 MED ORDER — HYDROCODONE-ACETAMINOPHEN 5-325 MG PO TABS: 1.0000 | ORAL_TABLET | ORAL | 0 refills | Status: DC | PRN

## 2023-02-02 NOTE — Discharge Instructions (Signed)

## 2023-02-02 NOTE — Discharge Summary (Signed)
Patient ID: Dominique Moore MRN: [id number] DOB/AGE: 80-Mar-1942 80 y.o.  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 She underwent management with robotic assisted laparoscopic converted to open right hemicolectomy. She has been recovering adequately. Wounds are healing well. Pain is controlled. She is passing gas and having bowel movements. She is tolerating soft diet. No nausea or vomiting. Patient ambulating well.   Physical Exam Vitals reviewed.  HENT:     Head: Normocephalic.  Cardiovascular:     Rate and Rhythm: Normal rate and regular rhythm.  Pulmonary:     Effort: Pulmonary effort is normal.     Breath sounds: Normal breath sounds.  Abdominal:     General: Abdomen is flat. There is no distension.     Tenderness: There is no abdominal tenderness.  Musculoskeletal:        General: Normal range of motion.     Cervical back: Normal range of motion.  Skin:    General: Skin is warm.  Neurological:     Mental Status: She is alert and oriented to person, place, and time.      Consults: None  Disposition: Discharge disposition: 01-Home or Self Care       Discharge Instructions     Diet - low sodium heart healthy   Complete by: As directed    Increase activity slowly   Complete by: As directed       Allergies as of 02/02/2023       Reactions   Ace Inhibitors Swelling   Feet swelling   Avelox [moxifloxacin] Swelling   Codeine Nausea And Vomiting   Penicillins Hives   IgE = 123 (WNL) on 09/07/2020        Medication List     TAKE these medications    acetaminophen 500 MG tablet Commonly known as: TYLENOL Take 500 mg by mouth every 6 (six) hours as needed.   amiodarone 200 MG tablet Commonly known as: PACERONE Take 1 tablet (200 mg total) by mouth 2 (two) times  daily. Hold until you see your cardiologist   amLODipine 10 MG tablet Commonly known as: NORVASC Take 10 mg by mouth daily.   apixaban 5 MG Tabs tablet Commonly known as: ELIQUIS Take 1 tablet (5 mg total) by mouth 2 (two) times daily. Restart from Thursday   atenolol 25 MG tablet Commonly known as: TENORMIN Take 25 mg by mouth daily.   citalopram 20 MG tablet Commonly known as: CELEXA Take 20 mg by mouth daily.   clotrimazole 1 % cream Commonly known as: LOTRIMIN Apply 1 application topically as needed (rash on hands).   doxazosin 4 MG tablet Commonly known as: CARDURA Take 4 mg by mouth daily.   furosemide 20 MG tablet Commonly known as: LASIX Take 20 mg by mouth daily.   hydrochlorothiazide 25 MG tablet Commonly known as: HYDRODIURIL Take 25 mg by mouth daily.   HYDROcodone-acetaminophen 5-325 MG tablet Commonly known as: NORCO/VICODIN Take 1 tablet by mouth every 4 (four) hours as needed for moderate pain.   losartan 100 MG tablet Commonly known as: COZAAR Take 100 mg by mouth daily.   potassium chloride SA 20 MEQ tablet Commonly known as: KLOR-CON M Take 20 mEq by mouth daily.   pravastatin 20 MG tablet Commonly known as: PRAVACHOL Take 20 mg by mouth at bedtime.  Follow-up Information     Carolan Shiver, MD Follow up in 2 week(s).   Specialty: General Surgery Contact information: [address] [place] [address] [phone]

## 2023-02-03 ENCOUNTER — Other Ambulatory Visit: Payer: Self-pay | Admitting: Anatomic Pathology & Clinical Pathology

## 2023-02-03 LAB — SURGICAL PATHOLOGY

## 2023-02-03 NOTE — Anesthesia Postprocedure Evaluation (Signed)
Anesthesia Post Note  Patient: Dominique Moore  Procedure(s) Performed: XI ROBOT ASSISTED RIGHT COLECTOMY  Patient location during evaluation: PACU Anesthesia Type: General Level of consciousness: awake and alert Pain management: pain level controlled Vital Signs Assessment: post-procedure vital signs reviewed and stable Respiratory status: spontaneous breathing, nonlabored ventilation, respiratory function stable and patient connected to nasal cannula oxygen Cardiovascular status: blood pressure returned to baseline and stable Postop Assessment: no apparent nausea or vomiting Anesthetic complications: no   No notable events documented.   Last Vitals:  Vitals:   02/02/23 0403 02/02/23 0745  BP: 138/79 132/69  Pulse: 78 74  Resp: 18 16  Temp: 37.1 C 36.7 C  SpO2: 97% 94%    Last Pain:  Vitals:   02/02/23 0745  TempSrc:   PainSc: 0-No pain                 Yevette Edwards

## 2023-02-06 ENCOUNTER — Other Ambulatory Visit: Payer: Medicare Other

## 2023-02-06 ENCOUNTER — Telehealth: Payer: Self-pay

## 2023-02-06 NOTE — Telephone Encounter (Signed)
-----   Message from Rickard Patience, MD sent at 02/06/2023 12:39 PM EDT ----- Please arrange her to do a follow up appt with me to review pathology result and surveillance plan.  MD only, non urgent. Thanks.

## 2023-03-10 ENCOUNTER — Ambulatory Visit: Payer: Medicare Other | Admitting: Oncology

## 2023-03-24 ENCOUNTER — Other Ambulatory Visit: Payer: Self-pay | Admitting: Oncology

## 2023-03-24 ENCOUNTER — Encounter: Payer: Self-pay | Admitting: Oncology

## 2023-03-24 ENCOUNTER — Inpatient Hospital Stay: Payer: Medicare Other | Attending: Oncology | Admitting: Oncology

## 2023-03-24 ENCOUNTER — Inpatient Hospital Stay: Payer: Medicare Other

## 2023-03-24 VITALS — BP 135/57 | HR 68 | Temp 96.3°F | Resp 18 | Wt 234.4 lb

## 2023-03-24 DIAGNOSIS — Z79899 Other long term (current) drug therapy: Secondary | ICD-10-CM | POA: Diagnosis not present

## 2023-03-24 DIAGNOSIS — D649 Anemia, unspecified: Secondary | ICD-10-CM | POA: Diagnosis not present

## 2023-03-24 DIAGNOSIS — C7A012 Malignant carcinoid tumor of the ileum: Secondary | ICD-10-CM | POA: Insufficient documentation

## 2023-03-24 DIAGNOSIS — C7A8 Other malignant neuroendocrine tumors: Secondary | ICD-10-CM | POA: Diagnosis not present

## 2023-03-24 DIAGNOSIS — E876 Hypokalemia: Secondary | ICD-10-CM | POA: Insufficient documentation

## 2023-03-24 DIAGNOSIS — Z803 Family history of malignant neoplasm of breast: Secondary | ICD-10-CM | POA: Insufficient documentation

## 2023-03-24 DIAGNOSIS — R918 Other nonspecific abnormal finding of lung field: Secondary | ICD-10-CM | POA: Diagnosis not present

## 2023-03-24 DIAGNOSIS — Z7189 Other specified counseling: Secondary | ICD-10-CM | POA: Diagnosis not present

## 2023-03-24 DIAGNOSIS — Z853 Personal history of malignant neoplasm of breast: Secondary | ICD-10-CM | POA: Insufficient documentation

## 2023-03-24 DIAGNOSIS — Z7901 Long term (current) use of anticoagulants: Secondary | ICD-10-CM | POA: Diagnosis not present

## 2023-03-24 DIAGNOSIS — I4891 Unspecified atrial fibrillation: Secondary | ICD-10-CM | POA: Insufficient documentation

## 2023-03-24 DIAGNOSIS — K6389 Other specified diseases of intestine: Secondary | ICD-10-CM

## 2023-03-24 LAB — CBC (CANCER CENTER ONLY)
HCT: 33.5 % — ABNORMAL LOW (ref 36.0–46.0)
Hemoglobin: 11 g/dL — ABNORMAL LOW (ref 12.0–15.0)
MCH: 27.8 pg (ref 26.0–34.0)
MCHC: 32.8 g/dL (ref 30.0–36.0)
MCV: 84.8 fL (ref 80.0–100.0)
Platelet Count: 182 10*3/uL (ref 150–400)
RBC: 3.95 MIL/uL (ref 3.87–5.11)
RDW: 13.6 % (ref 11.5–15.5)
WBC Count: 4.6 10*3/uL (ref 4.0–10.5)
nRBC: 0 % (ref 0.0–0.2)

## 2023-03-24 LAB — CMP (CANCER CENTER ONLY)
ALT: 16 U/L (ref 0–44)
AST: 22 U/L (ref 15–41)
Albumin: 3.8 g/dL (ref 3.5–5.0)
Alkaline Phosphatase: 80 U/L (ref 38–126)
Anion gap: 8 (ref 5–15)
BUN: 16 mg/dL (ref 8–23)
CO2: 26 mmol/L (ref 22–32)
Calcium: 8.6 mg/dL — ABNORMAL LOW (ref 8.9–10.3)
Chloride: 102 mmol/L (ref 98–111)
Creatinine: 0.79 mg/dL (ref 0.44–1.00)
GFR, Estimated: >60 mL/min (ref >60)
Glucose, Bld: 140 mg/dL — ABNORMAL HIGH (ref 70–99)
Potassium: 3.3 mmol/L — ABNORMAL LOW (ref 3.5–5.1)
Sodium: 136 mmol/L (ref 135–145)
Total Bilirubin: 0.5 mg/dL (ref 0.3–1.2)
Total Protein: 7.2 g/dL (ref 6.5–8.1)

## 2023-03-24 NOTE — Assessment & Plan Note (Signed)
Pathology results were reviewed by me and discussed with patient Repeat chromogranin A level and urine 5-HIAA  Check CT chest abdomen pelvis 6 months after surgery.

## 2023-03-24 NOTE — Assessment & Plan Note (Signed)
Potassium is 3.3  This is likely secondary to Lasix use. She is on potassium supplementation.

## 2023-03-24 NOTE — Progress Notes (Signed)
Hematology/Oncology Consult note [phone] [phone]        REFERRING PROVIDER: Gracelyn Nurse, MD   CHIEF COMPLAINTS/REASON FOR VISIT:  ileum well-differentiated neuroendocrine tumor   ASSESSMENT & PLAN:   Neuroendocrine carcinoma [facility]) Pathology results were reviewed by me and discussed with patient Repeat chromogranin A level and urine 5-HIAA  Check CT chest abdomen pelvis 6 months after surgery.   Hypokalemia Potassium is 3.3  This is likely secondary to Lasix use. She is on potassium supplementation.   Normocytic anemia Likely due to recent surgery.  Repeat Cbc showed improved Hb    Orders Placed This Encounter  Procedures   US Venous Img Lower Unilateral Right    Standing Status:   Future    Standing Expiration Date:   03/23/2024    Order Specific Question:   Reason for Exam (SYMPTOM  OR DIAGNOSIS REQUIRED)    Answer:   leg swelling    Order Specific Question:   Preferred imaging location?    Answer:   [facility]   CT CHEST ABDOMEN PELVIS W CONTRAST    Standing Status:   Future    Standing Expiration Date:   03/23/2024    Order Specific Question:   If indicated for the ordered procedure, I authorize the administration of contrast media per Radiology protocol    Answer:   Yes    Order Specific Question:   Does the patient have a contrast media/X-ray dye allergy?    Answer:   No    Order Specific Question:   Preferred imaging location?    Answer:   [facility]    Order Specific Question:   If indicated for the ordered procedure, I authorize the administration of oral contrast media per Radiology protocol    Answer:   Yes   CBC (Cancer Center Only)    Standing Status:   Future    Number of Occurrences:   1    Standing Expiration Date:   03/23/2024   CMP (Cancer Center only)    Standing Status:   Future    Number of Occurrences:   1    Standing Expiration Date:   03/23/2024   Chromogranin A    Standing Status:   Future     Number of Occurrences:   1    Standing Expiration Date:   03/23/2024   5 HIAA, quantitative, urine, 24 hour    Standing Status:   Future    Standing Expiration Date:   03/23/2024    Follow-up 4 months. All questions were answered. The patient knows to call the clinic with any problems, questions or concerns.  Rickard Patience, MD, PhD [facility] Hematology Oncology 03/24/2023   HISTORY OF PRESENTING ILLNESS:   Dominique Moore is a  82 y.o.  female with PMH listed below was seen in consultation at the request of  Gracelyn Nurse, MD  for ileum well-differentiated neuroendocrine tumor  12/28 - 10/21/2022, patient was admitted due to right abdominal pain 10/17/2022 CT chest angiogram showed no evidence of PE.  Bibasilar hypoventilatory changes and a trace bilateral effusions.  No acute airspace disease.  Spiculated soft tissue mass along the serosal surface medial aspect ascending colon 2.7 cm.  Mild peripancreatic fat stranding consistent with acute uncomplicated pancreatitis.  Sigmoid diverticulosis without typhlitis.  Small hiatal hernia. 10/17/2022 CT abdomen pelvis with contrast showed 1. Spiculated soft tissue mass along the serosal surface medial aspect ascending colon, measuring up to 2.7 cm. Differential diagnosis  includes primary colonic malignancy, metastatic disease,or gastrointestinal stromal tumor. Further evaluation with PET-CT is recommended. 2. Mild peripancreatic fat stranding consistent with acute uncomplicated pancreatitis. Diffuse fatty infiltration of the pancreas.3. Sigmoid diverticulosis without diverticulitis. 4. Small hiatal hernia. 5.  Aortic Atherosclerosi  GI was consulted during the admission.  She was seen by Dr. Allegra Lai, colonoscopy showed evidence of right-sided ischemic colitis.  No evidence of mass lesion in the entire examined colon.  Dr. Allegra Lai was not able to intubate the terminal ileum due to significant looping and body habitus.  Patient establish care with  outpatient gastroenterology and was seen by Dr. Horace Porteous team.  11/20/2022, CT angio abdomen pelvis with or without contrast 1. 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 2. Minimal narrowing at the origin of the superior mesenteric artery due to noncalcified atheromatous plaque. 3. 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 4. Persistent thickening of the wall of the proximal ascending colon consistent with biopsy-proven ischemic colitis. The mesenteric mass appears to be parasitizing the arterial flow from the right colic artery, likely causing ischemia of the downstream proximal ascending colon. 5. Mild colonic diverticulosis without evidence of acute diverticulitis.  Patient was referred to establish care with oncology for further evaluation.  Today she is accompanied by her husband. She denies any skin flushing or diarrhea.  Right-sided abdominal pain has completely resolved. Denies any unintentional weight loss, night sweats or fever.  Denies wheezing.  12/17/2022 DOTATAE PET showed  1. Intense radiotracer activity associated with a rounded soft tissue mass in the distal ileum/terminal ileum consistent well differentiated neuroendocrine tumor. Favor small bowel primary neuroendocrine tumor. 2. Local neuroendocrine tumor nodal metastasis to the ileocecal mesentery. 3. No evidence of additional metastatic adenopathy in the abdomen pelvis. 4. No evidence of liver metastasis. 5. No evidence of distant metastatic disease. 6. Several small RIGHT lung nodules are indeterminate but favored unrelated to neuroendocrine tumor  And favored  benign    INTERVAL HISTORY Dominique Moore is a 82 y.o. female who has above history reviewed by me today presents for follow up visit for ileum well-differentiated neuroendocrine tumor.  S/p right hemicolectomy.  Pathology showed A. COLON, RIGHT; HEMICOLECTOMY:  - WELL-DIFFERENTIATED NEUROENDOCRINE TUMOR, WHO GRADE 1, 2.0 CM,  INVOLVING TERMINAL ILEUM.  - LARGE MESENTERIC MASS, 3.5 CM, COMPATIBLE WITH WELL-DIFFERENTIATED  NEUROENDOCRINE TUMOR.  - BENIGN VERMIFORM APPENDIX WITH FIBROUS OBLITERATION OF THE DISTAL TIP.  - COLONIC TISSUE WITH ULCERATION, DENSE FIBROUS SEROSAL ADHESIONS, AND  CHRONIC INFLAMMATORY RESPONSE.  - SIXTEEN LYMPH NODES, NEGATIVE FOR MALIGNANCY (0/16).  - SEE CANCER SUMMARY.  TUMOR  Tumor Site: Ileum  Histologic Type and Grade: G1, well-differentiated neuroendocrine tumor  Histologic Grade Determination:                 Mitotic Rate: Less than 2 mitoses per 2 mm                 Ki-67 Labeling Index: Less than 3%  Tumor Size: Greatest dimension: 2.0 cm  Tumor Focality: Unifocal  Tumor Extent: Invades visceral peritoneum (serosa)  Lymphatic and/or Vascular Invasion: Not identified  Large Mesenteric Mass (>2cm): Present  Number of large mesenteric masses:  1   MARGINS  Margin Status: All margins negative for tumor   REGIONAL LYMPH NODES  Regional Lymph Node Status: Regional lymph nodes present       All regional lymph nodes negative for tumor       Number of Lymph Nodes Examined: 16   pT4 pN2  Patient reports recovering well from the surgery. No new complaints.    MEDICAL HISTORY:  Past Medical History:  Diagnosis Date   A-fib (HCC)    Anemia    Anxiety    Breast cancer (HCC) 2000   left breast lumpectomy with rad tx   Complication of anesthesia    Depression    Diabetes mellitus without complication (HCC)    Dyspnea    Hyperlipidemia    Hypertension    Osteoporosis    Personal history of radiation therapy    PONV (postoperative nausea and  vomiting)    with breast surgery   Pre-diabetes    Sleep apnea    no CPAP    SURGICAL HISTORY: Past Surgical History:  Procedure Laterality Date   BREAST BIOPSY Left 2000   positive   BREAST LUMPECTOMY     CATARACT EXTRACTION Bilateral    COLONOSCOPY WITH PROPOFOL N/A 02/22/2016   Procedure: COLONOSCOPY WITH PROPOFOL;  Surgeon: Scot Jun, MD;  Location: [facility] ENDOSCOPY;  Service: Endoscopy;  Laterality: N/A;   COLONOSCOPY WITH PROPOFOL N/A 10/21/2022   Procedure: COLONOSCOPY WITH PROPOFOL;  Surgeon: Toney Reil, MD;  Location: [facility] ENDOSCOPY;  Service: Gastroenterology;  Laterality: N/A;   EYE SURGERY     bilateral catracts   JOINT REPLACEMENT     KNEE ARTHROPLASTY Right 08/27/2017   Procedure: COMPUTER ASSISTED TOTAL KNEE ARTHROPLASTY;  Surgeon: Donato Heinz, MD;  Location: [facility] ORS;  Service: Orthopedics;  Laterality: Right;   KNEE ARTHROPLASTY Left 09/20/2020   Procedure: COMPUTER ASSISTED TOTAL KNEE ARTHROPLASTY;  Surgeon: Donato Heinz, MD;  Location: [facility] ORS;  Service: Orthopedics;  Laterality: Left;    SOCIAL HISTORY: Social History   Socioeconomic History   Marital status: Married    Spouse name: Rowsell   Number of children: Not on file   Years of education: Not on file   Highest education level: Not on file  Occupational History   Not on file  Tobacco Use   Smoking status: Never   Smokeless tobacco: Never  Vaping Use   Vaping Use: Never used  Substance and Sexual Activity   Alcohol use: No   Drug use: No   Sexual activity: Not on file  Other Topics Concern   Not on file  Social History Narrative   Lives with spouse at home    Social Determinants of Health   Financial Resource Strain: Low Risk  (11/29/2022)   Overall Financial Resource Strain (CARDIA)    Difficulty of Paying Living Expenses: Not hard at all  Food Insecurity: No Food Insecurity (02/01/2023)   Hunger Vital Sign    Worried About Running Out of Food in the Last Year: Never  true    Ran Out of Food in the Last Year: Never true  Transportation Needs: No Transportation Needs (02/01/2023)   PRAPARE - Administrator, Civil Service (Medical): No    Lack of Transportation (Non-Medical): No  Physical Activity: Not on file  Stress: No Stress Concern Present (11/29/2022)   [employer] of Occupational Health - Occupational Stress Questionnaire    Feeling of Stress : Not at all  Social Connections:  Not on file  Intimate Partner Violence: Not At Risk (02/01/2023)   Humiliation, Afraid, Rape, and Kick questionnaire    Fear of Current or Ex-Partner: No    Emotionally Abused: No    Physically Abused: No    Sexually Abused: No    FAMILY HISTORY: Family History  Problem Relation Age of Onset   Breast cancer Mother 61    ALLERGIES:  is allergic to ace inhibitors, avelox [moxifloxacin], codeine, and penicillins.  MEDICATIONS:  Current Outpatient Medications  Medication Sig Dispense Refill   acetaminophen (TYLENOL) 500 MG tablet Take 500 mg by mouth every 6 (six) hours as needed.     amLODipine (NORVASC) 10 MG tablet Take 10 mg by mouth daily.      apixaban (ELIQUIS) 5 MG TABS tablet Take 1 tablet (5 mg total) by mouth 2 (two) times daily. Restart from Thursday 60 tablet    atenolol (TENORMIN) 25 MG tablet Take 25 mg by mouth daily.      citalopram (CELEXA) 20 MG tablet Take 20 mg by mouth daily.     clotrimazole (LOTRIMIN) 1 % cream Apply 1 application topically as needed (rash on hands).      doxazosin (CARDURA) 4 MG tablet Take 4 mg by mouth daily.     hydrochlorothiazide (HYDRODIURIL) 25 MG tablet Take 25 mg by mouth daily.     HYDROcodone-acetaminophen (NORCO/VICODIN) 5-325 MG tablet Take 1 tablet by mouth every 4 (four) hours as needed for moderate pain. 16 tablet 0   losartan (COZAAR) 100 MG tablet Take 100 mg by mouth daily.     metroNIDAZOLE (FLAGYL) 500 MG tablet Take by mouth.     potassium chloride SA (KLOR-CON M) 20 MEQ tablet Take 20 mEq  by mouth daily.     pravastatin (PRAVACHOL) 20 MG tablet Take 20 mg by mouth at bedtime.     amiodarone (PACERONE) 200 MG tablet Take 1 tablet (200 mg total) by mouth 2 (two) times daily. Hold until you see your cardiologist (Patient not taking: Reported on 01/21/2023)     furosemide (LASIX) 20 MG tablet Take 20 mg by mouth daily. (Patient not taking: Reported on 01/21/2023)     No current facility-administered medications for this visit.    Review of Systems  Constitutional:  Negative for appetite change, chills, fatigue and fever.  HENT:   Negative for hearing loss and voice change.   Eyes:  Negative for eye problems.  Respiratory:  Negative for chest tightness and cough.   Cardiovascular:  Negative for chest pain.  Gastrointestinal:  Negative for abdominal distention, abdominal pain and blood in stool.  Endocrine: Negative for hot flashes.  Genitourinary:  Negative for difficulty urinating and frequency.   Musculoskeletal:  Negative for arthralgias.  Skin:  Negative for itching and rash.  Neurological:  Negative for extremity weakness.  Hematological:  Negative for adenopathy.  Psychiatric/Behavioral:  Negative for confusion.    PHYSICAL EXAMINATION: ECOG PERFORMANCE STATUS: 1 - Symptomatic but completely ambulatory Vitals:   03/24/23 1355  BP: (!) 135/57  Pulse: 68  Resp: 18  Temp: (!) 96.3 F (35.7 C)  SpO2: 94%   Filed Weights   03/24/23 1355  Weight: 234 lb 6.4 oz (106.3 kg)    Physical Exam Constitutional:      General: She is not in acute distress.    Appearance: She is obese.  HENT:     Head: Normocephalic and atraumatic.  Eyes:     General: No scleral icterus. Cardiovascular:  Rate and Rhythm: Normal rate.  Pulmonary:     Effort: Pulmonary effort is normal. No respiratory distress.     Breath sounds: No wheezing.  Abdominal:     General: There is no distension.  Musculoskeletal:        General: No deformity. Normal range of motion.     Cervical back:  Normal range of motion.  Skin:    Findings: No rash.  Neurological:     Mental Status: She is alert and oriented to person, place, and time. Mental status is at baseline.     Cranial Nerves: No cranial nerve deficit.     Coordination: Coordination normal.  Psychiatric:        Mood and Affect: Mood normal.     LABORATORY DATA:  I have reviewed the data as listed    Latest Ref Rng & Units 03/24/2023    2:38 PM 01/31/2023    3:49 AM 01/30/2023    5:32 AM  CBC  WBC 4.0 - 10.5 K/uL 4.6  10.3  11.7   Hemoglobin 12.0 - 15.0 g/dL 19.1  9.8  47.8   Hematocrit 36.0 - 46.0 % 33.5  31.2  32.0   Platelets 150 - 400 K/uL 182  155  158       Latest Ref Rng & Units 03/24/2023    2:38 PM 01/31/2023    3:49 AM 01/30/2023    5:32 AM  CMP  Glucose 70 - 99 mg/dL 295  621  308   BUN 8 - 23 mg/dL 16  13  15    Creatinine 0.44 - 1.00 mg/dL 6.57  8.46  9.62   Sodium 135 - 145 mmol/L 136  140  137   Potassium 3.5 - 5.1 mmol/L 3.3  3.2  3.3   Chloride 98 - 111 mmol/L 102  107  105   CO2 22 - 32 mmol/L 26  26  24    Calcium 8.9 - 10.3 mg/dL 8.6  8.1  8.0   Total Protein 6.5 - 8.1 g/dL 7.2     Total Bilirubin 0.3 - 1.2 mg/dL 0.5     Alkaline Phos 38 - 126 U/L 80     AST 15 - 41 U/L 22     ALT 0 - 44 U/L 16         RADIOGRAPHIC STUDIES: I have personally reviewed the radiological images as listed and agreed with the findings in the report. No results found.

## 2023-03-24 NOTE — Assessment & Plan Note (Signed)
Likely due to recent surgery.  Repeat Cbc showed improved Hb

## 2023-03-27 ENCOUNTER — Telehealth: Payer: Self-pay

## 2023-03-27 ENCOUNTER — Ambulatory Visit
Admission: RE | Admit: 2023-03-27 | Discharge: 2023-03-27 | Disposition: A | Discharge status: Home / Self Care | Payer: Medicare Other | Source: Ambulatory Visit | Attending: Oncology | Admitting: Oncology

## 2023-03-27 DIAGNOSIS — K6389 Other specified diseases of intestine: Secondary | ICD-10-CM | POA: Insufficient documentation

## 2023-03-27 NOTE — Telephone Encounter (Signed)
-----   Message from Rickard Patience, MD sent at 03/27/2023  3:25 PM EDT ----- Please let her know that she has no blood clots in right lower extremity.

## 2023-03-27 NOTE — Telephone Encounter (Signed)
Pt informed

## 2023-03-28 LAB — CHROMOGRANIN A: Chromogranin A (ng/mL): 68.8 ng/mL (ref 0.0–101.8)

## 2023-03-31 ENCOUNTER — Other Ambulatory Visit: Payer: Self-pay

## 2023-03-31 DIAGNOSIS — K6389 Other specified diseases of intestine: Secondary | ICD-10-CM

## 2023-03-31 DIAGNOSIS — C7A012 Malignant carcinoid tumor of the ileum: Secondary | ICD-10-CM | POA: Diagnosis not present

## 2023-04-03 LAB — 5 HIAA, QUANTITATIVE, URINE, 24 HOUR
5-HIAA, Ur: 1.9 mg/L
5-HIAA,Quant.,24 Hr Urine: 2.6 mg/24 hr (ref 0.0–14.9)
Total Volume: 1350

## 2023-04-04 NOTE — Telephone Encounter (Signed)
Erroneous encounter, please disregard Chriss Driver, RN

## 2023-06-04 ENCOUNTER — Other Ambulatory Visit: Payer: Self-pay | Admitting: Internal Medicine

## 2023-06-04 DIAGNOSIS — Z1231 Encounter for screening mammogram for malignant neoplasm of breast: Secondary | ICD-10-CM

## 2023-06-30 ENCOUNTER — Ambulatory Visit
Admission: RE | Admit: 2023-06-30 | Discharge: 2023-06-30 | Disposition: A | Discharge status: Home / Self Care | Payer: Medicare Other | Source: Ambulatory Visit | Attending: Internal Medicine | Admitting: Internal Medicine

## 2023-06-30 DIAGNOSIS — Z1231 Encounter for screening mammogram for malignant neoplasm of breast: Secondary | ICD-10-CM | POA: Insufficient documentation

## 2023-07-23 ENCOUNTER — Other Ambulatory Visit: Payer: Self-pay

## 2023-07-23 DIAGNOSIS — C7A8 Other malignant neuroendocrine tumors: Secondary | ICD-10-CM

## 2023-07-24 ENCOUNTER — Ambulatory Visit
Admission: RE | Admit: 2023-07-24 | Discharge: 2023-07-24 | Disposition: A | Discharge status: Home / Self Care | Payer: Medicare Other | Source: Ambulatory Visit | Attending: Oncology | Admitting: Oncology

## 2023-07-24 ENCOUNTER — Inpatient Hospital Stay: Payer: Medicare Other | Attending: Oncology

## 2023-07-24 DIAGNOSIS — K6389 Other specified diseases of intestine: Secondary | ICD-10-CM | POA: Diagnosis present

## 2023-07-24 DIAGNOSIS — D649 Anemia, unspecified: Secondary | ICD-10-CM | POA: Insufficient documentation

## 2023-07-24 DIAGNOSIS — R918 Other nonspecific abnormal finding of lung field: Secondary | ICD-10-CM | POA: Diagnosis not present

## 2023-07-24 DIAGNOSIS — C7A012 Malignant carcinoid tumor of the ileum: Secondary | ICD-10-CM | POA: Diagnosis present

## 2023-07-24 DIAGNOSIS — C7A8 Other malignant neuroendocrine tumors: Secondary | ICD-10-CM

## 2023-07-24 DIAGNOSIS — I700 Atherosclerosis of aorta: Secondary | ICD-10-CM | POA: Insufficient documentation

## 2023-07-24 LAB — CMP (CANCER CENTER ONLY)
ALT: 18 U/L (ref 0–44)
AST: 23 U/L (ref 15–41)
Albumin: 3.8 g/dL (ref 3.5–5.0)
Alkaline Phosphatase: 82 U/L (ref 38–126)
Anion gap: 7 (ref 5–15)
BUN: 16 mg/dL (ref 8–23)
CO2: 26 mmol/L (ref 22–32)
Calcium: 8.7 mg/dL — ABNORMAL LOW (ref 8.9–10.3)
Chloride: 100 mmol/L (ref 98–111)
Creatinine: 0.9 mg/dL (ref 0.44–1.00)
GFR, Estimated: >60 mL/min (ref >60)
Glucose, Bld: 142 mg/dL — ABNORMAL HIGH (ref 70–99)
Potassium: 3.5 mmol/L (ref 3.5–5.1)
Sodium: 133 mmol/L — ABNORMAL LOW (ref 135–145)
Total Bilirubin: 0.6 mg/dL (ref 0.3–1.2)
Total Protein: 7.2 g/dL (ref 6.5–8.1)

## 2023-07-24 LAB — CBC WITH DIFFERENTIAL (CANCER CENTER ONLY)
Abs Immature Granulocytes: 0.03 10*3/uL (ref 0.00–0.07)
Basophils Absolute: 0 10*3/uL (ref 0.0–0.1)
Basophils Relative: 1 %
Eosinophils Absolute: 0.2 10*3/uL (ref 0.0–0.5)
Eosinophils Relative: 4 %
HCT: 34.9 % — ABNORMAL LOW (ref 36.0–46.0)
Hemoglobin: 11.6 g/dL — ABNORMAL LOW (ref 12.0–15.0)
Immature Granulocytes: 1 %
Lymphocytes Relative: 16 %
Lymphs Abs: 0.8 10*3/uL (ref 0.7–4.0)
MCH: 27.8 pg (ref 26.0–34.0)
MCHC: 33.2 g/dL (ref 30.0–36.0)
MCV: 83.7 fL (ref 80.0–100.0)
Monocytes Absolute: 0.5 10*3/uL (ref 0.1–1.0)
Monocytes Relative: 10 %
Neutro Abs: 3.7 10*3/uL (ref 1.7–7.7)
Neutrophils Relative %: 68 %
Platelet Count: 166 10*3/uL (ref 150–400)
RBC: 4.17 MIL/uL (ref 3.87–5.11)
RDW: 13 % (ref 11.5–15.5)
WBC Count: 5.3 10*3/uL (ref 4.0–10.5)
nRBC: 0 % (ref 0.0–0.2)

## 2023-07-24 MED ORDER — IOHEXOL 300 MG/ML  SOLN
100.0000 mL | Freq: Once | INTRAMUSCULAR | Status: AC | PRN
  Administered 2023-07-24: 100 mL via INTRAVENOUS

## 2023-07-25 LAB — CHROMOGRANIN A: Chromogranin A (ng/mL): 46.5 ng/mL (ref 0.0–101.8)

## 2023-07-28 ENCOUNTER — Other Ambulatory Visit: Payer: Self-pay

## 2023-07-28 ENCOUNTER — Ambulatory Visit: Payer: Medicare Other | Admitting: Oncology

## 2023-07-28 ENCOUNTER — Encounter: Payer: Self-pay | Admitting: Oncology

## 2023-07-28 ENCOUNTER — Inpatient Hospital Stay (HOSPITAL_BASED_OUTPATIENT_CLINIC_OR_DEPARTMENT_OTHER): Payer: Medicare Other | Admitting: Oncology

## 2023-07-28 VITALS — BP 109/65 | HR 60 | Temp 97.5°F | Resp 18 | Wt 236.0 lb

## 2023-07-28 DIAGNOSIS — C7A8 Other malignant neuroendocrine tumors: Secondary | ICD-10-CM

## 2023-07-28 DIAGNOSIS — C7A012 Malignant carcinoid tumor of the ileum: Secondary | ICD-10-CM | POA: Diagnosis not present

## 2023-07-28 DIAGNOSIS — D649 Anemia, unspecified: Secondary | ICD-10-CM

## 2023-07-28 NOTE — Assessment & Plan Note (Addendum)
Pathology results were reviewed by me and discussed with patient Stable chromogranin A level and urine 5-HIAA level is pending CT chest abdomen pelvis w contrast showed no recurrence. Reviewed with patient.  Recommend annual CT surveillance.

## 2023-07-28 NOTE — Assessment & Plan Note (Signed)
Hb is stable. Monitor.  

## 2023-07-28 NOTE — Progress Notes (Signed)
Hematology/Oncology Consult note [phone] [phone]        REFERRING PROVIDER: Gracelyn Nurse, MD   CHIEF COMPLAINTS/REASON FOR VISIT:  ileum well-differentiated neuroendocrine tumor   ASSESSMENT & PLAN:   Neuroendocrine carcinoma [facility]) Pathology results were reviewed by me and discussed with patient Stable chromogranin A level and urine 5-HIAA level is pending CT chest abdomen pelvis w contrast showed no recurrence.  Recommend annual CT surveillance.   Normocytic anemia Hb is stable. Monitor.    Orders Placed This Encounter  Procedures   CT CHEST ABDOMEN PELVIS W CONTRAST    Standing Status:   Future    Standing Expiration Date:   07/27/2024    Order Specific Question:   If indicated for the ordered procedure, I authorize the administration of contrast media per Radiology protocol    Answer:   Yes    Order Specific Question:   Does the patient have a contrast media/X-ray dye allergy?    Answer:   No    Order Specific Question:   Preferred imaging location?    Answer:   [facility]    Order Specific Question:   If indicated for the ordered procedure, I authorize the administration of oral contrast media per Radiology protocol    Answer:   Yes   CMP (Cancer Center only)    Standing Status:   Future    Standing Expiration Date:   07/27/2024   CBC with Differential (Cancer Center Only)    Standing Status:   Future    Standing Expiration Date:   07/27/2024   Chromogranin A    Standing Status:   Future    Standing Expiration Date:   07/27/2024   5 HIAA, quantitative, urine, 24 hour    Standing Status:   Future    Standing Expiration Date:   07/27/2024    Follow-up 1 year. All questions were answered. The patient knows to call the clinic with any problems, questions or concerns.  Rickard Patience, MD, PhD [facility] Hematology Oncology 07/28/2023   HISTORY OF PRESENTING ILLNESS:   AMORIA MCLEES is a  82 y.o.  female with PMH listed below  was seen in consultation at the request of  Gracelyn Nurse, MD  for ileum well-differentiated neuroendocrine tumor  12/28 - 10/21/2022, patient was admitted due to right abdominal pain 10/17/2022 CT chest angiogram showed no evidence of PE.  Bibasilar hypoventilatory changes and a trace bilateral effusions.  No acute airspace disease.  Spiculated soft tissue mass along the serosal surface medial aspect ascending colon 2.7 cm.  Mild peripancreatic fat stranding consistent with acute uncomplicated pancreatitis.  Sigmoid diverticulosis without typhlitis.  Small hiatal hernia. 10/17/2022 CT abdomen pelvis with contrast showed 1. Spiculated soft tissue mass along the serosal surface medial aspect ascending colon, measuring up to 2.7 cm. Differential diagnosis includes primary colonic malignancy, metastatic disease,or gastrointestinal stromal tumor. Further evaluation with PET-CT is recommended. 2. Mild peripancreatic fat stranding consistent with acute uncomplicated pancreatitis. Diffuse fatty infiltration of the pancreas.3. Sigmoid diverticulosis without diverticulitis. 4. Small hiatal hernia. 5.  Aortic Atherosclerosi  GI was consulted during the admission.  She was seen by Dr. Allegra Lai, colonoscopy showed evidence of right-sided ischemic colitis.  No evidence of mass lesion in the entire examined colon.  Dr. Allegra Lai was not able to intubate the terminal ileum due to significant looping and body habitus.  Patient establish care with outpatient gastroenterology and was seen by Dr. Horace Porteous team.  11/20/2022, CT angio abdomen  pelvis with or without contrast 1. 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 2. Minimal narrowing at the origin of the superior mesenteric artery due to noncalcified atheromatous plaque. 3. Solid mildly hyperenhancing mass within the terminal ileum measuring 2.4 x 1.8 x 1.7 cm is suspicious for primary site  of neoplasm, most likely carcinoid. 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 4. Persistent thickening of the wall of the proximal ascending colon consistent with biopsy-proven ischemic colitis. The mesenteric mass appears to be parasitizing the arterial flow from the right colic artery, likely causing ischemia of the downstream proximal ascending colon. 5. Mild colonic diverticulosis without evidence of acute diverticulitis.  Patient was referred to establish care with oncology for further evaluation.  Today she is accompanied by her husband. She denies any skin flushing or diarrhea.  Right-sided abdominal pain has completely resolved. Denies any unintentional weight loss, night sweats or fever.  Denies wheezing.  12/17/2022 DOTATAE PET showed  1. Intense radiotracer activity associated with a rounded soft tissue mass in the distal ileum/terminal ileum consistent well differentiated neuroendocrine tumor. Favor small bowel primary neuroendocrine tumor. 2. Local neuroendocrine tumor nodal metastasis to the ileocecal mesentery. 3. No evidence of additional metastatic adenopathy in the abdomen pelvis. 4. No evidence of liver metastasis. 5. No evidence of distant metastatic disease. 6. Several small RIGHT lung nodules are indeterminate but favored unrelated to neuroendocrine tumor  And favored benign    INTERVAL HISTORY SUTTYN CRYDER is a 82 y.o. female who has above history reviewed by me today presents for follow up visit for ileum well-differentiated neuroendocrine tumor. She has no new complaints.  Denies weight loss, abdominal pain, diarrhea.   10/3/24024 CT chest abdomen pelvis w contrast  1. Prior partial distal small bowel/cecal resection with ileocolic anastomosis. No evidence of local recurrence. 2. Heterogeneous nodularity in the anterior right upper  quadrant omentum is favored postsurgical change/fat necrosis. Attention on follow-up. 3. Stable tiny pulmonary nodules which did not demonstrate abnormal radiotracer uptake on prior PET-CT but are below the resolution of PET. No new suspicious pulmonary nodules or masses. Suggest continued attention on follow-up imaging. 4. Stable too small to accurately characterize hypodense lesion in the posterior right lobe of the liver common not FDG avid on prior PET-CT and favored benign. 5.  Aortic Atherosclerosis   MEDICAL HISTORY:  Past Medical History:  Diagnosis Date   A-fib (HCC)    Anemia    Anxiety    Breast cancer (HCC) 2000   left breast lumpectomy with rad tx   Complication of anesthesia    Depression    Diabetes mellitus without complication (HCC)    Dyspnea    Hyperlipidemia    Hypertension    Osteoporosis    Personal history of radiation therapy    PONV (postoperative nausea and vomiting)    with breast surgery   Pre-diabetes    Sleep apnea    no CPAP    SURGICAL HISTORY: Past Surgical History:  Procedure Laterality Date   BREAST BIOPSY Left 2000   positive   BREAST LUMPECTOMY     CATARACT EXTRACTION Bilateral    COLONOSCOPY WITH PROPOFOL N/A 02/22/2016   Procedure: COLONOSCOPY WITH PROPOFOL;  Surgeon: Scot Jun, MD;  Location[facility] ENDOSCOPY;  Service: Endoscopy;  Laterality: N/A;   COLONOSCOPY WITH PROPOFOL  N/A 10/21/2022   Procedure: COLONOSCOPY WITH PROPOFOL;  Surgeon: Toney Reil, MD;  Location: [facility] ENDOSCOPY;  Service: Gastroenterology;  Laterality: N/A;   EYE SURGERY     bilateral catracts   JOINT REPLACEMENT     KNEE ARTHROPLASTY Right 08/27/2017   Procedure: COMPUTER ASSISTED TOTAL KNEE ARTHROPLASTY;  Surgeon: Donato Heinz, MD;  Location: [facility] ORS;  Service: Orthopedics;  Laterality: Right;   KNEE ARTHROPLASTY Left 09/20/2020   Procedure: COMPUTER ASSISTED TOTAL KNEE ARTHROPLASTY;  Surgeon: Donato Heinz, MD;  Location: [facility] ORS;  Service:  Orthopedics;  Laterality: Left;    SOCIAL HISTORY: Social History   Socioeconomic History   Marital status: Married    Spouse name: [place]   Number of children: Not on file   Years of education: Not on file   Highest education level: Not on file  Occupational History   Not on file  Tobacco Use   Smoking status: Never   Smokeless tobacco: Never  Vaping Use   Vaping status: Never Used  Substance and Sexual Activity   Alcohol use: No   Drug use: No   Sexual activity: Not on file  Other Topics Concern   Not on file  Social History Narrative   Lives with spouse at home    Social Determinants of Health   Financial Resource Strain: Low Risk  (11/29/2022)   Overall Financial Resource Strain (CARDIA)    Difficulty of Paying Living Expenses: Not hard at all  Food Insecurity: No Food Insecurity (02/01/2023)   Hunger Vital Sign    Worried About Running Out of Food in the Last Year: Never true    Ran Out of Food in the Last Year: Never true  Transportation Needs: No Transportation Needs (02/01/2023)   PRAPARE - Administrator, Civil Service (Medical): No    Lack of Transportation (Non-Medical): No  Physical Activity: Not on file  Stress: No Stress Concern Present (11/29/2022)   [employer] of Occupational Health - Occupational Stress Questionnaire    Feeling of Stress : Not at all  Social Connections: Not on file  Intimate Partner Violence: Not At Risk (02/01/2023)   Humiliation, Afraid, Rape, and Kick questionnaire    Fear of Current or Ex-Partner: No    Emotionally Abused: No    Physically Abused: No    Sexually Abused: No    FAMILY HISTORY: Family History  Problem Relation Age of Onset   Breast cancer Mother 38    ALLERGIES:  is allergic to ace inhibitors, avelox [moxifloxacin], codeine, and penicillins.  MEDICATIONS:  Current Outpatient Medications  Medication Sig Dispense Refill   acetaminophen (TYLENOL) 500 MG tablet Take 500 mg by mouth every 6  (six) hours as needed.     amLODipine (NORVASC) 10 MG tablet Take 10 mg by mouth daily.      apixaban (ELIQUIS) 5 MG TABS tablet Take 1 tablet (5 mg total) by mouth 2 (two) times daily. Restart from Thursday 60 tablet    atenolol (TENORMIN) 25 MG tablet Take 25 mg by mouth daily.      citalopram (CELEXA) 20 MG tablet Take 20 mg by mouth daily.     doxazosin (CARDURA) 4 MG tablet Take 4 mg by mouth daily.     hydrochlorothiazide (HYDRODIURIL) 25 MG tablet Take 25 mg by mouth daily.     losartan (COZAAR) 100 MG tablet Take 100 mg by mouth daily.     potassium chloride SA (KLOR-CON M) 20 MEQ tablet Take  20 mEq by mouth daily.     pravastatin (PRAVACHOL) 20 MG tablet Take 20 mg by mouth at bedtime.     HYDROcodone-acetaminophen (NORCO/VICODIN) 5-325 MG tablet Take 1 tablet by mouth every 4 (four) hours as needed for moderate pain. (Patient not taking: Reported on 07/28/2023) 16 tablet 0   metroNIDAZOLE (FLAGYL) 500 MG tablet Take by mouth. (Patient not taking: Reported on 07/28/2023)     No current facility-administered medications for this visit.    Review of Systems  Constitutional:  Negative for appetite change, chills, fatigue and fever.  HENT:   Negative for hearing loss and voice change.   Eyes:  Negative for eye problems.  Respiratory:  Negative for chest tightness and cough.   Cardiovascular:  Negative for chest pain.  Gastrointestinal:  Negative for abdominal distention, abdominal pain and blood in stool.  Endocrine: Negative for hot flashes.  Genitourinary:  Negative for difficulty urinating and frequency.   Musculoskeletal:  Negative for arthralgias.  Skin:  Negative for itching and rash.  Neurological:  Negative for extremity weakness.  Hematological:  Negative for adenopathy.  Psychiatric/Behavioral:  Negative for confusion.    PHYSICAL EXAMINATION: ECOG PERFORMANCE STATUS: 1 - Symptomatic but completely ambulatory Vitals:   07/28/23 1310  BP: 109/65  Pulse: 60  Resp: 18   Temp: (!) 97.5 F (36.4 C)   Filed Weights   07/28/23 1310  Weight: 236 lb (107 kg)    Physical Exam Constitutional:      General: She is not in acute distress.    Appearance: She is obese.  HENT:     Head: Normocephalic and atraumatic.  Eyes:     General: No scleral icterus. Cardiovascular:     Rate and Rhythm: Normal rate.  Pulmonary:     Effort: Pulmonary effort is normal. No respiratory distress.  Abdominal:     General: There is no distension.  Musculoskeletal:        General: No deformity. Normal range of motion.     Cervical back: Normal range of motion.     Right lower leg: Edema present.     Left lower leg: Edema present.  Skin:    Findings: No rash.  Neurological:     Mental Status: She is alert and oriented to person, place, and time. Mental status is at baseline.  Psychiatric:        Mood and Affect: Mood normal.     LABORATORY DATA:  I have reviewed the data as listed    Latest Ref Rng & Units 07/24/2023   11:13 AM 03/24/2023    2:38 PM 01/31/2023    3:49 AM  CBC  WBC 4.0 - 10.5 K/uL 5.3  4.6  10.3   Hemoglobin 12.0 - 15.0 g/dL 19.1  47.8  9.8   Hematocrit 36.0 - 46.0 % 34.9  33.5  31.2   Platelets 150 - 400 K/uL 166  182  155       Latest Ref Rng & Units 07/24/2023   11:13 AM 03/24/2023    2:38 PM 01/31/2023    3:49 AM  CMP  Glucose 70 - 99 mg/dL 295  621  308   BUN 8 - 23 mg/dL 16  16  13    Creatinine 0.44 - 1.00 mg/dL 6.57  8.46  9.62   Sodium 135 - 145 mmol/L 133  136  140   Potassium 3.5 - 5.1 mmol/L 3.5  3.3  3.2   Chloride 98 - 111 mmol/L 100  102  107   CO2 22 - 32 mmol/L 26  26  26    Calcium 8.9 - 10.3 mg/dL 8.7  8.6  8.1   Total Protein 6.5 - 8.1 g/dL 7.2  7.2    Total Bilirubin 0.3 - 1.2 mg/dL 0.6  0.5    Alkaline Phos 38 - 126 U/L 82  80    AST 15 - 41 U/L 23  22    ALT 0 - 44 U/L 18  16        RADIOGRAPHIC STUDIES: I have personally reviewed the radiological images as listed and agreed with the findings in the report. CT  CHEST ABDOMEN PELVIS W CONTRAST  Result Date: 07/24/2023 CLINICAL DATA:  History of small-bowel cancer, monitor. * Tracking Code: BO * EXAM: CT CHEST, ABDOMEN, AND PELVIS WITH CONTRAST TECHNIQUE: Multidetector CT imaging of the chest, abdomen and pelvis was performed following the standard protocol during bolus administration of intravenous contrast. RADIATION DOSE REDUCTION: This exam was performed according to the departmental dose-optimization program which includes automated exposure control, adjustment of the mA and/or kV according to patient size and/or use of iterative reconstruction technique. CONTRAST:  OMNIPAQUE IOHEXOL 300 MG/ML  SOLN COMPARISON:  Multiple priors including PET-CT December 17, 2022. FINDINGS: CT CHEST FINDINGS Cardiovascular: Aortic atherosclerosis. No central pulmonary embolus on this nondedicated study. Mild cardiac enlargement. Trace pericardial effusion. Calcifications of the aortic valve. Coronary artery calcifications. Mediastinum/Nodes: Bilateral thyroid nodules measure up to 12 mm, requiring no independent imaging follow-up. No pathologically enlarged mediastinal, hilar or axillary lymph nodes. Tiny hiatal hernia. Lungs/Pleura: Stable tiny pulmonary nodules which did not demonstrate abnormal radiotracer uptake on prior PET-CT but are below the resolution of PET. No new suspicious pulmonary nodules or masses. For reference: -right middle lobe pulmonary nodule measures 4 mm on image 91/4, unchanged -right upper lobe pulmonary nodule measures 6 mm on image 67/4 Hypoventilatory change in the dependent lungs. Musculoskeletal: No aggressive lytic or blastic lesion of bone. Multilevel degenerative changes spine. CT ABDOMEN PELVIS FINDINGS Hepatobiliary: Stable too small to accurately characterize hypodense lesion in the posterior right lobe of the liver on image 52/2. Gallbladder is unremarkable. No biliary ductal dilation. Pancreas: Fatty pancreatic atrophy.  No pancreatic ductal  dilation. Spleen: No splenomegaly. Adrenals/Urinary Tract: Bilateral adrenal glands appear normal. No hydronephrosis. Kidneys demonstrate symmetric enhancement. Urinary bladder is unremarkable for degree of distension. Stomach/Bowel: Radiopaque enteric contrast material traverses the sigmoid colon. Stomach is minimally distended limiting evaluation. No pathologic dilation of small or large bowel. No evidence of acute bowel inflammation. Prior partial distal small bowel/cecal resection with ileocolic anastomosis common no new suspicious nodularity along the suture line. Vascular/Lymphatic: Normal caliber abdominal aorta. Smooth IVC contours. The portal, splenic and superior mesenteric veins are patent. No pathologically enlarged abdominal or pelvic lymph nodes. Reproductive: Uterus and bilateral adnexa are unremarkable. Other: Heterogeneous nodularity in the anterior right upper quadrant omentum on image 74/2 is favored postsurgical change/fat necrosis. Musculoskeletal: No aggressive lytic or blastic lesion of bone. Multilevel degenerative changes spine. IMPRESSION: 1. Prior partial distal small bowel/cecal resection with ileocolic anastomosis. No evidence of local recurrence. 2. Heterogeneous nodularity in the anterior right upper quadrant omentum is favored postsurgical change/fat necrosis. Attention on follow-up. 3. Stable tiny pulmonary nodules which did not demonstrate abnormal radiotracer uptake on prior PET-CT but are below the resolution of PET. No new suspicious pulmonary nodules or masses. Suggest continued attention on follow-up imaging. 4. Stable too small to accurately characterize hypodense lesion in the posterior  right lobe of the liver common not FDG avid on prior PET-CT and favored benign. 5.  Aortic Atherosclerosis (ICD10-I70.0). Electronically Signed   By: Maudry Mayhew M.D.   On: 07/24/2023 15:26   MM 3D SCREENING MAMMOGRAM BILATERAL BREAST  Result Date: 07/01/2023 CLINICAL DATA:  Screening.  History of left lumpectomy in 2000. EXAM: DIGITAL SCREENING BILATERAL MAMMOGRAM WITH TOMOSYNTHESIS AND CAD TECHNIQUE: Bilateral screening digital craniocaudal and mediolateral oblique mammograms were obtained. Bilateral screening digital breast tomosynthesis was performed. The images were evaluated with computer-aided detection. COMPARISON:  Previous exam(s). ACR Breast Density Category b: There are scattered areas of fibroglandular density. FINDINGS: There are no findings suspicious for malignancy. IMPRESSION: No mammographic evidence of malignancy. A result letter of this screening mammogram will be mailed directly to the patient. RECOMMENDATION: Screening mammogram in one year. (Code:SM-B-01Y) BI-RADS CATEGORY  1: Negative. Electronically Signed   By: Romona Curls M.D.   On: 07/01/2023 15:07

## 2023-07-29 ENCOUNTER — Telehealth: Payer: Self-pay | Admitting: *Deleted

## 2023-07-29 NOTE — Telephone Encounter (Signed)
Patient called and wanted 2 medications removed from her medicine list that she was seeing on her AVS I have removed them as pwer her request and her stating that she does not take them and does not need them

## 2023-08-03 LAB — 5 HIAA, QUANTITATIVE, URINE, 24 HOUR
5-HIAA, Ur: 1.9 mg/L
5-HIAA,Quant.,24 Hr Urine: 2.3 mg/(24.h) (ref 0.0–14.9)
Total Volume: 1200

## 2024-05-03 MED ORDER — SODIUM CHLORIDE 0.9 % IV SOLN: INTRAVENOUS | Status: DC

## 2024-05-04 ENCOUNTER — Other Ambulatory Visit: Payer: Self-pay

## 2024-05-04 ENCOUNTER — Ambulatory Visit
Admission: RE | Admit: 2024-05-04 | Discharge: 2024-05-04 | Disposition: A | Discharge status: Home / Self Care | Source: Home / Self Care

## 2024-05-04 ENCOUNTER — Encounter
Admission: RE | Disposition: A | Discharge status: Home / Self Care | Payer: Self-pay | Source: Home / Self Care | Attending: Cardiology

## 2024-05-04 ENCOUNTER — Encounter: Payer: Self-pay | Admitting: Cardiology

## 2024-05-04 ENCOUNTER — Ambulatory Visit
Admission: RE | Admit: 2024-05-04 | Discharge: 2024-05-04 | Disposition: A | Discharge status: Home / Self Care | Attending: Cardiology | Admitting: Cardiology

## 2024-05-04 ENCOUNTER — Ambulatory Visit: Admitting: Registered Nurse

## 2024-05-04 DIAGNOSIS — I10 Essential (primary) hypertension: Secondary | ICD-10-CM | POA: Diagnosis not present

## 2024-05-04 DIAGNOSIS — Z6841 Body Mass Index (BMI) 40.0 and over, adult: Secondary | ICD-10-CM | POA: Insufficient documentation

## 2024-05-04 DIAGNOSIS — E119 Type 2 diabetes mellitus without complications: Secondary | ICD-10-CM | POA: Diagnosis not present

## 2024-05-04 DIAGNOSIS — E785 Hyperlipidemia, unspecified: Secondary | ICD-10-CM | POA: Diagnosis not present

## 2024-05-04 DIAGNOSIS — Z7901 Long term (current) use of anticoagulants: Secondary | ICD-10-CM | POA: Insufficient documentation

## 2024-05-04 DIAGNOSIS — I081 Rheumatic disorders of both mitral and tricuspid valves: Secondary | ICD-10-CM | POA: Insufficient documentation

## 2024-05-04 DIAGNOSIS — G4730 Sleep apnea, unspecified: Secondary | ICD-10-CM | POA: Diagnosis not present

## 2024-05-04 DIAGNOSIS — I4819 Other persistent atrial fibrillation: Secondary | ICD-10-CM | POA: Diagnosis not present

## 2024-05-04 DIAGNOSIS — R0609 Other forms of dyspnea: Secondary | ICD-10-CM | POA: Diagnosis not present

## 2024-05-04 DIAGNOSIS — I4891 Unspecified atrial fibrillation: Secondary | ICD-10-CM | POA: Diagnosis present

## 2024-05-04 HISTORY — PX: TEE WITHOUT CARDIOVERSION: SHX5443

## 2024-05-04 HISTORY — PX: CARDIOVERSION: SHX1299

## 2024-05-04 LAB — ECHO TEE

## 2024-05-04 SURGERY — ECHOCARDIOGRAM, TRANSESOPHAGEAL
Anesthesia: General

## 2024-05-04 MED ORDER — PROPOFOL 10 MG/ML IV BOLUS
INTRAVENOUS | Status: DC | PRN
  Administered 2024-05-04: 30 mg via INTRAVENOUS
  Administered 2024-05-04: 20 mg via INTRAVENOUS
  Administered 2024-05-04 (×2): 30 mg via INTRAVENOUS
  Administered 2024-05-04: 10 mg via INTRAVENOUS
  Administered 2024-05-04: 50 mg via INTRAVENOUS

## 2024-05-04 MED ORDER — LIDOCAINE VISCOUS HCL 2 % MT SOLN
OROMUCOSAL | Status: AC
  Filled 2024-05-04: qty 15

## 2024-05-04 MED ORDER — BUTAMBEN-TETRACAINE-BENZOCAINE 2-2-14 % EX AERO
INHALATION_SPRAY | CUTANEOUS | Status: AC
  Filled 2024-05-04: qty 5

## 2024-05-04 MED ORDER — PROPOFOL 1000 MG/100ML IV EMUL
INTRAVENOUS | Status: AC
  Filled 2024-05-04: qty 300

## 2024-05-04 NOTE — Procedures (Signed)
 Electrical Cardioversion Procedure Note  Indication: Atrial fibrillation  Procedure Details: Consent: Indication, Risk/benefits of procedure as well as the alternatives explained to patient and informed consent obtained. Time out performed. Verified patient identification, verified procedure, verified correct patient position, special equipment/implants available, medications/allergies/relevent history reviewed, required imaging and test results reviewed.  Deep sedation was provided by anesthesia with propofol . Patient was delivered with 200 Joules of electricity X 1 without success and then 300J X 1 with success to Sinus rhythm. Patient tolerated the procedure well. No immediate complication noted.   Successful cardioversion  Dominique Paterson, MD [facility] Cardiology- [facility]

## 2024-05-04 NOTE — Progress Notes (Signed)
*  PRELIMINARY RESULTS* Echocardiogram Echocardiogram Transesophageal has been performed.  Floydene Harder 05/04/2024, 1:22 PM

## 2024-05-04 NOTE — Anesthesia Preprocedure Evaluation (Addendum)
 Anesthesia Evaluation  Patient identified by MRN, date of birth, ID band Patient awake    Reviewed: Allergy & Precautions, NPO status , Patient's Chart, lab work & pertinent test results  History of Anesthesia Complications (+) PONV and history of anesthetic complications  Airway Mallampati: III   Neck ROM: Full    Dental  (+) Missing Bridges :   Pulmonary sleep apnea    Pulmonary exam normal breath sounds clear to auscultation       Cardiovascular hypertension, + dysrhythmias (a fib on Eliquis )  Rhythm:Irregular Rate:Normal  Myocardial perfusion 03/04/22:  Indeterminant Lexiscan infusion EKG due to baseline EKG changes and atrial  fibrillation  Normal myocardial perfusion without evidence of myocardial ischemia   Echo 03/04/22:  NORMAL LEFT VENTRICULAR SYSTOLIC FUNCTION   WITH MILD LVH  NORMAL RIGHT VENTRICULAR SYSTOLIC FUNCTION  NO VALVULAR STENOSIS  MILD to MODERATE TR  MILD MR, PR  EF 55%    Neuro/Psych negative neurological ROS     GI/Hepatic negative GI ROS,,,  Endo/Other  diabetes, Type 2  Class 3 obesityNeuroendocrine CA  Renal/GU negative Renal ROS     Musculoskeletal   Abdominal   Peds  Hematology  (+) Blood dyscrasia, anemia Breast CA   Anesthesia Other Findings   Reproductive/Obstetrics                              Anesthesia Physical Anesthesia Plan  ASA: 3  Anesthesia Plan: General   Post-op Pain Management:    Induction: Intravenous  PONV Risk Score and Plan: 4 or greater and Propofol  infusion, TIVA and Treatment may vary due to age or medical condition  Airway Management Planned: Natural Airway  Additional Equipment:   Intra-op Plan:   Post-operative Plan:   Informed Consent: I have reviewed the patients History and Physical, chart, labs and discussed the procedure including the risks, benefits and alternatives for the proposed anesthesia with the  patient or authorized representative who has indicated his/her understanding and acceptance.       Plan Discussed with: CRNA  Anesthesia Plan Comments: (LMA/GETA backup discussed.  Patient consented for risks of anesthesia including but not limited to:  - adverse reactions to medications - damage to eyes, teeth, lips or other oral mucosa - nerve damage due to positioning  - sore throat or hoarseness - damage to heart, brain, nerves, lungs, other parts of body or loss of life  Informed patient about role of CRNA in peri- and intra-operative care.  Patient voiced understanding.)         Anesthesia Quick Evaluation

## 2024-05-04 NOTE — Transfer of Care (Signed)
 Immediate Anesthesia Transfer of Care Note  Patient: Dominique Moore  Procedure(s) Performed: ECHO TEE ECHOCARDIOGRAM, TRANSESOPHAGEAL CARDIOVERSION  Patient Location: Cath Lab  Anesthesia Type:General  Level of Consciousness: awake, drowsy, and patient cooperative  Airway & Oxygen Therapy: Patient Spontanous Breathing and Patient connected to nasal cannula oxygen  Post-op Assessment: Report given to RN and Post -op Vital signs reviewed and stable  Post vital signs: Reviewed and stable  Last Vitals:  Vitals Value Taken Time  BP 92/53 05/04/24 13:18  Temp    Pulse 55 05/04/24 13:20  Resp 20 05/04/24 13:20  SpO2 95 % 05/04/24 13:20    Last Pain:  Vitals:   05/04/24 1225  TempSrc: Oral  PainSc: 0-No pain         Complications: No notable events documented.

## 2024-05-04 NOTE — Anesthesia Postprocedure Evaluation (Signed)
 Anesthesia Post Note  Patient: Dominique Moore  Procedure(s) Performed: ECHO TEE ECHOCARDIOGRAM, TRANSESOPHAGEAL CARDIOVERSION  Patient location during evaluation: PACU Anesthesia Type: General Level of consciousness: awake and alert, oriented and patient cooperative Pain management: pain level controlled Vital Signs Assessment: post-procedure vital signs reviewed and stable Respiratory status: spontaneous breathing, nonlabored ventilation and respiratory function stable Cardiovascular status: blood pressure returned to baseline and stable Postop Assessment: adequate PO intake Anesthetic complications: no   No notable events documented.   Last Vitals:  Vitals:   05/04/24 1335 05/04/24 1345  BP: 114/64 129/73  Pulse: (!) 52 (!) 53  Resp: 15 (!) 22  Temp: 36.8 C   SpO2: 95% 93%    Last Pain:  Vitals:   05/04/24 1335  TempSrc: Oral  PainSc: 0-No pain                 Alfonso Ruths

## 2024-05-04 NOTE — H&P (Signed)
 H&P:  History of Present Illness: [name] is a 83 y.o.female patient who presented to our clinic exertional dyspnea, atrial fibrillation   Past medical history significant for paroxysmal atrial fibrillation, hypertension, obesity, hyperlipidemia.  She was noted to be in atrial fibrillation in 2023, converted to sinus rhythm on amiodarone  which she later discontinued as she was doing well.  Echocardiogram 02/2022 with normal biventricular systolic function with mild to moderate TR, left atrium mildly enlarged.  Stress test 02/2022 normal with no ischemia.   Patient details that in last months she has symptoms of increased exertional dyspnea along with significant fatigue.  No complaints of chest pain/pressure, palpitations.  Has intermittent dizzy.  No syncope.      Past Medical and Surgical History  Past Medical History     Past Medical History:  Diagnosis Date   Carcinoma in situ of breast     COVID-19 08/2021   Depressive disorder, not elsewhere classified     Essential hypertension, benign     History of bronchitis      History of recurrent bronchitis, stable.   Impaired fasting glucose     Osteoporosis, unspecified     Other and unspecified hyperlipidemia     Unspecified sleep apnea        Past Surgical History She has a past surgical history that includes Mastectomy (Left, 2002); Cataract extraction (Bilateral, 2014); Colonoscopy (01/20/2006); Colonoscopy (02/22/2016); Right total knee arthroplasty using computer-assisted navigation (08/27/2017); Left total knee arthroplasty using computer-assisted navigation (09/20/2020); and open hemicolectomy (Right, 01/29/2023).    Medications and Allergies  Current Medications         Current Outpatient Medications  Medication Sig Dispense Refill   acetaminophen  (TYLENOL ) 500 MG tablet Take 500 mg by mouth every 6 (six) hours as needed         apixaban  (ELIQUIS ) 5 mg tablet Take by mouth       atenoloL  (TENORMIN ) 50 MG tablet Take 1  tablet (50 mg total) by mouth once daily 90 tablet 3   clotrimazole  (LOTRIMIN ) 1 % cream Apply topically 2 (two) times daily 30 g 2   doxazosin  (CARDURA ) 4 MG tablet take 1 tablet by mouth daily 90 tablet 3   ELIQUIS  5 mg tablet TAKE 1 TABLET BY MOUTH EVERY 12  HOURS 180 tablet 3   hydroCHLOROthiazide  (HYDRODIURIL ) 25 MG tablet take 1 tablet by mouth daily 90 tablet 3   losartan  (COZAAR ) 100 MG tablet TAKE 1 TABLET BY MOUTH ONCE  DAILY 90 tablet 3   potassium chloride  (KLOR-CON  M20) 20 MEQ ER tablet TAKE 1 TABLET BY MOUTH ONCE  DAILY 90 tablet 3   pravastatin  (PRAVACHOL ) 20 MG tablet TAKE 1 TABLET BY MOUTH AT NIGHT 90 tablet 3   AMIOdarone  (PACERONE ) 200 MG tablet Take 1 tablet (200 mg total) by mouth 2 (two) times daily 60 tablet 0   citalopram  (CELEXA ) 20 MG tablet TAKE 1 TABLET BY MOUTH ONCE  DAILY (Patient not taking: Reported on 04/20/2024) 90 tablet 3   FUROsemide (LASIX) 20 MG tablet Take 1 tablet (20 mg total) by mouth once daily as needed (Patient not taking: Reported on 04/20/2024) 30 tablet 1    No current facility-administered medications for this visit.      Allergies: Ace inhibitors, Avelox [moxifloxacin], Penicillins, Codeine phosphate, and Zocor [simvastatin]   Social and Family History  Social History  reports that she has never smoked. She has never used smokeless tobacco. She reports that she does not drink alcohol and  does not use drugs.   Family History       Family History  Problem Relation Name Age of Onset   Breast cancer Mother       Heart disease Mother       Diabetes type II Mother       Myocardial Infarction (Heart attack) Mother       High blood pressure (Hypertension) Mother       Fibrocystic breast disease Sister       Heart disease Brother            Rheumatic heart disease.   Fibrocystic breast disease Sister       Fibrocystic breast disease Sister       Fibrocystic breast disease Sister       Cancer Father          Review of Systems    Review of  Systems:  Worsening exertional dyspnea, fatigue   Physical Examination    Today's Vitals   05/04/24 1225  BP: (!) 162/106  Pulse: 86  Resp: 18  Temp: 98.7 F (37.1 C)  TempSrc: Oral  SpO2: 94%  Weight: 107.5 kg  Height: 5' 2 (1.575 m)  PainSc: 0-No pain   Body mass index is 43.35 kg/m.    HEENT: Pupils equally reactive to light and accomodation    Neck: Supple, no significant JVD Lungs: clear to auscultation bilaterally; no wheezes, rales, rhonchi Heart: Regular rate and rhythm. No murmur Extremities: no pedal edema   Assessment and Plan    83 y.o. female with  Symptomatic persistent atrial fibrillation Hypertension Hyperlipidemia Morbid obesity, sleep apnea   TEE/CV today. Continue current medications including anticoagulation.  Keller Paterson, MD [facility]- [facility]

## 2024-05-04 NOTE — Progress Notes (Signed)
 Patient clinically stable post Cardioversion per Dr Wilburn, tolerated well. Converted to sinus bradycardia. Vitals stable husband at bedside. Dr Wilburn in to speak with patient and husband post procedure with update given. Instructed to reduce atenolol  dose by half if heart rate is less than 50. Taking po's without difficulty.

## 2024-05-05 ENCOUNTER — Encounter: Payer: Self-pay | Admitting: Cardiology

## 2024-06-28 ENCOUNTER — Other Ambulatory Visit: Payer: Self-pay | Admitting: Internal Medicine

## 2024-06-28 DIAGNOSIS — Z1231 Encounter for screening mammogram for malignant neoplasm of breast: Secondary | ICD-10-CM

## 2024-07-01 ENCOUNTER — Ambulatory Visit
Admission: RE | Admit: 2024-07-01 | Discharge: 2024-07-01 | Disposition: A | Discharge status: Home / Self Care | Source: Ambulatory Visit | Attending: Internal Medicine | Admitting: Internal Medicine

## 2024-07-01 DIAGNOSIS — Z1231 Encounter for screening mammogram for malignant neoplasm of breast: Secondary | ICD-10-CM | POA: Diagnosis present

## 2024-07-27 ENCOUNTER — Ambulatory Visit
Admission: RE | Admit: 2024-07-27 | Discharge: 2024-07-27 | Disposition: A | Discharge status: Home / Self Care | Payer: Medicare Other | Source: Ambulatory Visit | Attending: Oncology | Admitting: Oncology

## 2024-07-27 DIAGNOSIS — C7A8 Other malignant neuroendocrine tumors: Secondary | ICD-10-CM | POA: Diagnosis present

## 2024-07-27 MED ORDER — IOHEXOL 300 MG/ML  SOLN
100.0000 mL | Freq: Once | INTRAMUSCULAR | Status: AC | PRN
  Administered 2024-07-27: 100 mL via INTRAVENOUS

## 2024-08-02 ENCOUNTER — Other Ambulatory Visit: Payer: Self-pay

## 2024-08-02 DIAGNOSIS — C7A8 Other malignant neuroendocrine tumors: Secondary | ICD-10-CM

## 2024-08-03 ENCOUNTER — Ambulatory Visit: Payer: Medicare Other | Admitting: Oncology

## 2024-08-03 ENCOUNTER — Inpatient Hospital Stay: Payer: Medicare Other | Admitting: Oncology

## 2024-08-03 ENCOUNTER — Encounter: Payer: Self-pay | Admitting: Oncology

## 2024-08-03 ENCOUNTER — Inpatient Hospital Stay: Payer: Medicare Other | Attending: Oncology

## 2024-08-03 VITALS — BP 182/81 | HR 57 | Temp 98.7°F | Resp 18 | Wt 236.1 lb

## 2024-08-03 DIAGNOSIS — C7A012 Malignant carcinoid tumor of the ileum: Secondary | ICD-10-CM | POA: Diagnosis present

## 2024-08-03 DIAGNOSIS — C7A8 Other malignant neuroendocrine tumors: Secondary | ICD-10-CM

## 2024-08-03 DIAGNOSIS — D649 Anemia, unspecified: Secondary | ICD-10-CM

## 2024-08-03 DIAGNOSIS — Z853 Personal history of malignant neoplasm of breast: Secondary | ICD-10-CM | POA: Diagnosis not present

## 2024-08-03 DIAGNOSIS — E785 Hyperlipidemia, unspecified: Secondary | ICD-10-CM | POA: Diagnosis not present

## 2024-08-03 DIAGNOSIS — M810 Age-related osteoporosis without current pathological fracture: Secondary | ICD-10-CM | POA: Insufficient documentation

## 2024-08-03 DIAGNOSIS — E119 Type 2 diabetes mellitus without complications: Secondary | ICD-10-CM | POA: Diagnosis not present

## 2024-08-03 DIAGNOSIS — Z7901 Long term (current) use of anticoagulants: Secondary | ICD-10-CM | POA: Diagnosis not present

## 2024-08-03 DIAGNOSIS — F32A Depression, unspecified: Secondary | ICD-10-CM | POA: Insufficient documentation

## 2024-08-03 DIAGNOSIS — I10 Essential (primary) hypertension: Secondary | ICD-10-CM | POA: Insufficient documentation

## 2024-08-03 DIAGNOSIS — Z79899 Other long term (current) drug therapy: Secondary | ICD-10-CM | POA: Insufficient documentation

## 2024-08-03 DIAGNOSIS — Z923 Personal history of irradiation: Secondary | ICD-10-CM | POA: Insufficient documentation

## 2024-08-03 DIAGNOSIS — I4891 Unspecified atrial fibrillation: Secondary | ICD-10-CM | POA: Insufficient documentation

## 2024-08-03 DIAGNOSIS — Z803 Family history of malignant neoplasm of breast: Secondary | ICD-10-CM | POA: Diagnosis not present

## 2024-08-03 DIAGNOSIS — G4730 Sleep apnea, unspecified: Secondary | ICD-10-CM | POA: Insufficient documentation

## 2024-08-03 DIAGNOSIS — C7B04 Secondary carcinoid tumors of peritoneum: Secondary | ICD-10-CM | POA: Insufficient documentation

## 2024-08-03 LAB — CMP (CANCER CENTER ONLY)
ALT: 18 U/L (ref 0–44)
AST: 21 U/L (ref 15–41)
Albumin: 3.9 g/dL (ref 3.5–5.0)
Alkaline Phosphatase: 72 U/L (ref 38–126)
Anion gap: 8 (ref 5–15)
BUN: 13 mg/dL (ref 8–23)
CO2: 28 mmol/L (ref 22–32)
Calcium: 8.9 mg/dL (ref 8.9–10.3)
Chloride: 99 mmol/L (ref 98–111)
Creatinine: 0.98 mg/dL (ref 0.44–1.00)
GFR, Estimated: 58 mL/min — ABNORMAL LOW (ref >60)
Glucose, Bld: 123 mg/dL — ABNORMAL HIGH (ref 70–99)
Potassium: 3.6 mmol/L (ref 3.5–5.1)
Sodium: 135 mmol/L (ref 135–145)
Total Bilirubin: 0.9 mg/dL (ref 0.0–1.2)
Total Protein: 6.9 g/dL (ref 6.5–8.1)

## 2024-08-03 LAB — CBC WITH DIFFERENTIAL (CANCER CENTER ONLY)
Abs Immature Granulocytes: 0.02 K/uL (ref 0.00–0.07)
Basophils Absolute: 0.1 K/uL (ref 0.0–0.1)
Basophils Relative: 1 %
Eosinophils Absolute: 0.2 K/uL (ref 0.0–0.5)
Eosinophils Relative: 3 %
HCT: 36.5 % (ref 36.0–46.0)
Hemoglobin: 12 g/dL (ref 12.0–15.0)
Immature Granulocytes: 0 %
Lymphocytes Relative: 12 %
Lymphs Abs: 0.7 K/uL (ref 0.7–4.0)
MCH: 27.7 pg (ref 26.0–34.0)
MCHC: 32.9 g/dL (ref 30.0–36.0)
MCV: 84.3 fL (ref 80.0–100.0)
Monocytes Absolute: 0.4 K/uL (ref 0.1–1.0)
Monocytes Relative: 8 %
Neutro Abs: 4.4 K/uL (ref 1.7–7.7)
Neutrophils Relative %: 76 %
Platelet Count: 173 K/uL (ref 150–400)
RBC: 4.33 MIL/uL (ref 3.87–5.11)
RDW: 14 % (ref 11.5–15.5)
WBC Count: 5.7 K/uL (ref 4.0–10.5)
nRBC: 0 % (ref 0.0–0.2)

## 2024-08-03 NOTE — Assessment & Plan Note (Signed)
 Bp is not well controlled since BP regimen adjustment. Recommend patient to follow up with PCP

## 2024-08-03 NOTE — Assessment & Plan Note (Signed)
Pathology results were reviewed by me and discussed with patient Stable chromogranin A level and urine 5-HIAA level is pending CT chest abdomen pelvis w contrast showed no recurrence. Reviewed with patient.  Recommend annual CT surveillance.

## 2024-08-03 NOTE — Assessment & Plan Note (Signed)
 Hb has normalized.

## 2024-08-03 NOTE — Progress Notes (Signed)
 Hematology/Oncology Consult note [phone] [phone]        REFERRING PROVIDER: Rudolpho Norleen BIRCH, MD   CHIEF COMPLAINTS/REASON FOR VISIT:  ileum well-differentiated neuroendocrine tumor   ASSESSMENT & PLAN:   Neuroendocrine carcinoma [name]) Pathology results were reviewed by me and discussed with patient Stable chromogranin A level and urine 5-HIAA level is pending CT chest abdomen pelvis w contrast showed no recurrence. Reviewed with patient.  Recommend annual CT surveillance.   Normocytic anemia Hb has normalized.   Hypertension Bp is not well controlled since BP regimen adjustment. Recommend patient to follow up with PCP   Orders Placed This Encounter  Procedures   CT CHEST ABDOMEN PELVIS W CONTRAST    Standing Status:   Future    Expected Date:   08/03/2025    Expiration Date:   11/01/2025    If indicated for the ordered procedure, I authorize the administration of contrast media per Radiology protocol:   Yes    Does the patient have a contrast media/X-ray dye allergy?:   No    Preferred imaging location?:   [facility]    If indicated for the ordered procedure, I authorize the administration of oral contrast media per Radiology protocol:   Yes   5 HIAA, quantitative, urine, 24 hour    Standing Status:   Future    Expected Date:   08/03/2025    Expiration Date:   11/01/2025   CMP (Cancer Center only)    Standing Status:   Future    Expected Date:   08/03/2025    Expiration Date:   11/01/2025   CBC with Differential (Cancer Center Only)    Standing Status:   Future    Expected Date:   08/03/2025    Expiration Date:   11/01/2025   Chromogranin A    Standing Status:   Future    Expected Date:   08/03/2025    Expiration Date:   11/01/2025    Follow-up 1 year. All questions were answered. The patient knows to call the clinic with any problems, questions or concerns.  Zelphia Cap, MD, PhD [facility] Hematology Oncology 08/03/2024    HISTORY OF PRESENTING ILLNESS:   Dominique Moore is a  83 y.o.  female with PMH listed below was seen in consultation at the request of  Rudolpho Norleen BIRCH, MD  for ileum well-differentiated neuroendocrine tumor  12/28 - 10/21/2022, patient was admitted due to right abdominal pain 10/17/2022 CT chest angiogram showed no evidence of PE.  Bibasilar hypoventilatory changes and a trace bilateral effusions.  No acute airspace disease.  Spiculated soft tissue mass along the serosal surface medial aspect ascending colon 2.7 cm.  Mild peripancreatic fat stranding consistent with acute uncomplicated pancreatitis.  Sigmoid diverticulosis without typhlitis.  Small hiatal hernia. 10/17/2022 CT abdomen pelvis with contrast showed 1. Spiculated soft tissue mass along the serosal surface medial aspect ascending colon, measuring up to 2.7 cm. Differential diagnosis includes primary colonic malignancy, metastatic disease,or gastrointestinal stromal tumor. Further evaluation with PET-CT is recommended. 2. Mild peripancreatic fat stranding consistent with acute uncomplicated pancreatitis. Diffuse fatty infiltration of the pancreas.3. Sigmoid diverticulosis without diverticulitis. 4. Small hiatal hernia. 5.  Aortic Atherosclerosi  GI was consulted during the admission.  She was seen by Dr. Unk, colonoscopy showed evidence of right-sided ischemic colitis.  No evidence of mass lesion in the entire examined colon.  Dr. Unk was not able to intubate the terminal ileum due to significant looping and body habitus.  Patient establish care with outpatient gastroenterology and was seen by Dr. Sanjuan team.  11/20/2022, CT angio abdomen pelvis with or without contrast 1. 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 2. Minimal narrowing at the origin of the superior mesenteric artery due to noncalcified atheromatous plaque. 3. Solid mildly hyperenhancing mass  within the terminal ileum measuring 2.4 x 1.8 x 1.7 cm is suspicious for primary site of neoplasm, most likely carcinoid. 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 4. Persistent thickening of the wall of the proximal ascending colon consistent with biopsy-proven ischemic colitis. The mesenteric mass appears to be parasitizing the arterial flow from the right colic artery, likely causing ischemia of the downstream proximal ascending colon. 5. Mild colonic diverticulosis without evidence of acute diverticulitis.  Patient was referred to establish care with oncology for further evaluation.  Today she is accompanied by her husband. She denies any skin flushing or diarrhea.  Right-sided abdominal pain has completely resolved. Denies any unintentional weight loss, night sweats or fever.  Denies wheezing.  12/17/2022 DOTATAE PET showed  1. Intense radiotracer activity associated with a rounded soft tissue mass in the distal ileum/terminal ileum consistent well differentiated neuroendocrine tumor. Favor small bowel primary neuroendocrine tumor. 2. Local neuroendocrine tumor nodal metastasis to the ileocecal mesentery. 3. No evidence of additional metastatic adenopathy in the abdomen pelvis. 4. No evidence of liver metastasis. 5. No evidence of distant metastatic disease. 6. Several small RIGHT lung nodules are indeterminate but favored unrelated to neuroendocrine tumor  And favored benign    INTERVAL HISTORY Dominique Moore is a 83 y.o. female who has above history reviewed by me today presents for follow up visit for ileum well-differentiated neuroendocrine tumor.  Discussed the use of AI scribe software for clinical note transcription with the patient, who gave verbal consent to proceed.   She has no new complaints.  Denies weight loss, abdominal pain, diarrhea.    Her blood pressure remains consistently high despite home monitoring. She was previously on amlodipine  but discontinued it due to significant leg swelling, which has improved but persists. Her atenolol  dosage was increased to 50 mg, but she feels her blood pressure remains poorly controlled.  She has a history of atrial fibrillation and underwent cardioversion a couple of months ago.  Patient is on Eliquis  for anticoagulation.  MEDICAL HISTORY:  Past Medical History:  Diagnosis Date   A-fib (HCC)    Anemia    Anxiety    Breast cancer (HCC) 2000   left breast lumpectomy with rad tx   Complication of anesthesia    Depression    Diabetes mellitus without complication (HCC)    Dyspnea    Hyperlipidemia    Hypertension    Osteoporosis    Personal history of radiation therapy    PONV (postoperative nausea and vomiting)    with breast surgery   Pre-diabetes    Sleep apnea    no CPAP    SURGICAL HISTORY: Past Surgical History:  Procedure Laterality Date   BREAST BIOPSY Left 2000   positive   BREAST LUMPECTOMY     CARDIOVERSION N/A 05/04/2024   Procedure: CARDIOVERSION;  Surgeon: Wilburn Keller BROCKS, MD;  Location: [facility] ORS;  Service: Cardiovascular;  Laterality: N/A;   CATARACT EXTRACTION Bilateral    COLONOSCOPY WITH PROPOFOL  N/A 02/22/2016  Procedure: COLONOSCOPY WITH PROPOFOL ;  Surgeon: Lamar ONEIDA Holmes, MD;  Location: [facility] ENDOSCOPY;  Service: Endoscopy;  Laterality: N/A;   COLONOSCOPY WITH PROPOFOL  N/A 10/21/2022   Procedure: COLONOSCOPY WITH PROPOFOL ;  Surgeon: Unk Corinn Skiff, MD;  Location: [facility] ENDOSCOPY;  Service: Gastroenterology;  Laterality: N/A;   EYE SURGERY     bilateral catracts   JOINT REPLACEMENT     KNEE ARTHROPLASTY Right 08/27/2017   Procedure: COMPUTER ASSISTED TOTAL KNEE ARTHROPLASTY;  Surgeon: Mardee Lynwood SQUIBB, MD;  Location: [facility] ORS;  Service: Orthopedics;  Laterality: Right;   KNEE ARTHROPLASTY Left 09/20/2020   Procedure: COMPUTER ASSISTED TOTAL KNEE  ARTHROPLASTY;  Surgeon: Mardee Lynwood SQUIBB, MD;  Location: [facility] ORS;  Service: Orthopedics;  Laterality: Left;   TEE WITHOUT CARDIOVERSION N/A 05/04/2024   Procedure: ECHOCARDIOGRAM, TRANSESOPHAGEAL;  Surgeon: Alluri, Keller BROCKS, MD;  Location: [facility] ORS;  Service: Cardiovascular;  Laterality: N/A;    SOCIAL HISTORY: Social History   Socioeconomic History   Marital status: Married    Spouse name: Boxer   Number of children: Not on file   Years of education: Not on file   Highest education level: Not on file  Occupational History   Not on file  Tobacco Use   Smoking status: Never   Smokeless tobacco: Never  Vaping Use   Vaping status: Never Used  Substance and Sexual Activity   Alcohol use: No   Drug use: No   Sexual activity: Not on file  Other Topics Concern   Not on file  Social History Narrative   Lives with spouse at home    Social Drivers of Health   Financial Resource Strain: Low Risk  (11/29/2022)   Overall Financial Resource Strain (CARDIA)    Difficulty of Paying Living Expenses: Not hard at all  Food Insecurity: No Food Insecurity (02/01/2023)   Hunger Vital Sign    Worried About Running Out of Food in the Last Year: Never true    Ran Out of Food in the Last Year: Never true  Transportation Needs: No Transportation Needs (02/01/2023)   PRAPARE - Administrator, Civil Service (Medical): No    Lack of Transportation (Non-Medical): No  Physical Activity: Not on file  Stress: No Stress Concern Present (11/29/2022)   [employer] of Occupational Health - Occupational Stress Questionnaire    Feeling of Stress : Not at all  Social Connections: Not on file  Intimate Partner Violence: Not At Risk (02/01/2023)   Humiliation, Afraid, Rape, and Kick questionnaire    Fear of Current or Ex-Partner: No    Emotionally Abused: No    Physically Abused: No    Sexually Abused: No    FAMILY HISTORY: Family History  Problem Relation Age of Onset   Breast cancer  Mother 29    ALLERGIES:  is allergic to ace inhibitors, avelox [moxifloxacin], codeine, penicillins, and amlodipine .  MEDICATIONS:  Current Outpatient Medications  Medication Sig Dispense Refill   amiodarone  (PACERONE ) 200 MG tablet Take 200 mg by mouth 2 (two) times daily.     apixaban  (ELIQUIS ) 5 MG TABS tablet Take 1 tablet (5 mg total) by mouth 2 (two) times daily. Restart from Thursday 60 tablet    atenolol  (TENORMIN ) 50 MG tablet Take 50 mg by mouth daily.     cetirizine (ZYRTEC) 10 MG tablet Take 10 mg by mouth at bedtime.     citalopram  (CELEXA ) 20 MG tablet Take 20 mg by mouth daily.     clotrimazole  (LOTRIMIN ) 1 %  cream Apply 1 Application topically 2 (two) times daily as needed (rash).     cyanocobalamin  (VITAMIN B12) 1000 MCG tablet Take 1,000 mcg by mouth daily.     doxazosin  (CARDURA ) 4 MG tablet Take 4 mg by mouth daily.     hydrochlorothiazide  (HYDRODIURIL ) 25 MG tablet Take 25 mg by mouth daily.     losartan  (COZAAR ) 100 MG tablet Take 100 mg by mouth daily.     Multiple Vitamin (MULTIVITAMIN WITH MINERALS) TABS tablet Take 1 tablet by mouth daily.     potassium chloride  SA (KLOR-CON  M) 20 MEQ tablet Take 20 mEq by mouth daily.     pravastatin  (PRAVACHOL ) 20 MG tablet Take 20 mg by mouth at bedtime.     atenolol  (TENORMIN ) 25 MG tablet Take 25 mg by mouth daily. (Patient not taking: Reported on 08/03/2024)     No current facility-administered medications for this visit.    Review of Systems  Constitutional:  Negative for appetite change, chills, fatigue and fever.  HENT:   Negative for hearing loss and voice change.   Eyes:  Negative for eye problems.  Respiratory:  Negative for chest tightness and cough.   Cardiovascular:  Negative for chest pain.  Gastrointestinal:  Negative for abdominal distention, abdominal pain and blood in stool.  Endocrine: Negative for hot flashes.  Genitourinary:  Negative for difficulty urinating and frequency.   Musculoskeletal:   Negative for arthralgias.  Skin:  Negative for itching and rash.  Neurological:  Negative for extremity weakness.  Hematological:  Negative for adenopathy.  Psychiatric/Behavioral:  Negative for confusion.    PHYSICAL EXAMINATION: ECOG PERFORMANCE STATUS: 1 - Symptomatic but completely ambulatory Vitals:   08/03/24 1305 08/03/24 1316  BP: (!) 182/95 (!) 182/81  Pulse: (!) 57   Resp: 18   Temp: 98.7 F (37.1 C)   SpO2: 90%    Filed Weights   08/03/24 1305  Weight: 236 lb 1.6 oz (107.1 kg)    Physical Exam Constitutional:      General: She is not in acute distress.    Appearance: She is obese.  HENT:     Head: Normocephalic and atraumatic.  Eyes:     General: No scleral icterus. Cardiovascular:     Rate and Rhythm: Normal rate.  Pulmonary:     Effort: Pulmonary effort is normal. No respiratory distress.  Abdominal:     General: There is no distension.  Musculoskeletal:        General: No deformity. Normal range of motion.     Cervical back: Normal range of motion.     Right lower leg: Edema present.     Left lower leg: Edema present.  Skin:    Findings: No rash.  Neurological:     Mental Status: She is alert and oriented to person, place, and time. Mental status is at baseline.  Psychiatric:        Mood and Affect: Mood normal.     LABORATORY DATA:  I have reviewed the data as listed    Latest Ref Rng & Units 08/03/2024   12:33 PM 07/24/2023   11:13 AM 03/24/2023    2:38 PM  CBC  WBC 4.0 - 10.5 K/uL 5.7  5.3  4.6   Hemoglobin 12.0 - 15.0 g/dL 87.9  88.3  88.9   Hematocrit 36.0 - 46.0 % 36.5  34.9  33.5   Platelets 150 - 400 K/uL 173  166  182       Latest Ref Rng &  Units 08/03/2024   12:32 PM 07/24/2023   11:13 AM 03/24/2023    2:38 PM  CMP  Glucose 70 - 99 mg/dL 876  857  859   BUN 8 - 23 mg/dL 13  16  16    Creatinine 0.44 - 1.00 mg/dL 9.01  9.09  9.20   Sodium 135 - 145 mmol/L 135  133  136   Potassium 3.5 - 5.1 mmol/L 3.6  3.5  3.3   Chloride 98 -  111 mmol/L 99  100  102   CO2 22 - 32 mmol/L 28  26  26    Calcium 8.9 - 10.3 mg/dL 8.9  8.7  8.6   Total Protein 6.5 - 8.1 g/dL 6.9  7.2  7.2   Total Bilirubin 0.0 - 1.2 mg/dL 0.9  0.6  0.5   Alkaline Phos 38 - 126 U/L 72  82  80   AST 15 - 41 U/L 21  23  22    ALT 0 - 44 U/L 18  18  16        RADIOGRAPHIC STUDIES: I have personally reviewed the radiological images as listed and agreed with the findings in the report. CT CHEST ABDOMEN PELVIS W CONTRAST Result Date: 07/30/2024 CLINICAL DATA:  Neuroendocrine carcinoma * Tracking Code: BO * EXAM: CT CHEST, ABDOMEN, AND PELVIS WITH CONTRAST TECHNIQUE: Multidetector CT imaging of the chest, abdomen and pelvis was performed following the standard protocol during bolus administration of intravenous contrast. RADIATION DOSE REDUCTION: This exam was performed according to the departmental dose-optimization program which includes automated exposure control, adjustment of the mA and/or kV according to patient size and/or use of iterative reconstruction technique. CONTRAST:  OMNIPAQUE  IOHEXOL  300 MG/ML  SOLN COMPARISON:  07/24/2023 FINDINGS: CT CHEST FINDINGS Cardiovascular: Aortic atherosclerosis. Cardiomegaly. Scattered left coronary artery calcifications. No pericardial effusion. Mediastinum/Nodes: No enlarged mediastinal, hilar, or axillary lymph nodes. Small hiatal hernia. Thyroid gland, trachea, and esophagus demonstrate no significant findings. Lungs/Pleura: Trace bilateral pleural effusions with interlobular septal thickening at the lung bases and underlying bandlike scarring. Multiple tiny pulmonary nodules, unchanged, for example a 0.5 cm nodule in the peripheral right middle lobe (series 4, image 90). Musculoskeletal: No chest wall abnormality. No acute osseous findings. CT ABDOMEN PELVIS FINDINGS Hepatobiliary: Tiny hypodense liver lesions unchanged for example in the posterior liver dome (series 2, image 50). No gallstones, gallbladder wall  thickening, or biliary dilatation. Pancreas: Fatty atrophy of the pancreas. No pancreatic ductal dilatation or surrounding inflammatory changes. Spleen: Normal in size without significant abnormality. Adrenals/Urinary Tract: Adrenal glands are unremarkable. Kidneys are normal, without renal calculi, solid lesion, or hydronephrosis. Bladder is unremarkable. Stomach/Bowel: Stomach is within normal limits. Partial right hemicolectomy and reanastomosis. No evidence of bowel wall thickening, distention, or inflammatory changes. Occasional sigmoid diverticula. Vascular/Lymphatic: No significant vascular findings are present. No enlarged abdominal or pelvic lymph nodes. Reproductive: No mass or other abnormality. Other: Broad-based midline ventral hernia (series 2, image 97) no ascites. Musculoskeletal: No acute osseous findings. Disc degenerative disease and bridging osteophytosis throughout the thoracic spine, in keeping with DISH. IMPRESSION: 1. Partial right hemicolectomy and reanastomosis. 2. Tiny hypodense liver lesions unchanged, although incompletely characterized presumed small benign cysts or hemangiomata. Attention on follow-up. 3. Multiple tiny pulmonary nodules, unchanged. 4. Cardiomegaly, small pleural effusions, and mild pulmonary edema. Aortic Atherosclerosis (ICD10-I70.0). Electronically Signed   By: Marolyn JONETTA Jaksch M.D.   On: 07/30/2024 07:17   MM 3D SCREENING MAMMOGRAM BILATERAL BREAST Result Date: 07/05/2024 CLINICAL DATA:  Screening. EXAM: DIGITAL  SCREENING BILATERAL MAMMOGRAM WITH TOMOSYNTHESIS AND CAD TECHNIQUE: Bilateral screening digital craniocaudal and mediolateral oblique mammograms were obtained. Bilateral screening digital breast tomosynthesis was performed. The images were evaluated with computer-aided detection. COMPARISON:  Previous exam(s). ACR Breast Density Category b: There are scattered areas of fibroglandular density. FINDINGS: There are no findings suspicious for malignancy. LEFT  surgical changes again noted. IMPRESSION: No mammographic evidence of malignancy. A result letter of this screening mammogram will be mailed directly to the patient. RECOMMENDATION: Screening mammogram in one year. (Code:SM-B-01Y) BI-RADS CATEGORY  2: Benign. Electronically Signed   By: Reyes Phi M.D.   On: 07/05/2024 16:54

## 2024-08-04 ENCOUNTER — Encounter: Payer: Self-pay | Admitting: Oncology

## 2024-08-05 LAB — CHROMOGRANIN A: Chromogranin A (ng/mL): 59.2 ng/mL (ref 0.0–101.8)

## 2024-08-09 ENCOUNTER — Other Ambulatory Visit: Payer: Self-pay

## 2024-08-09 DIAGNOSIS — C7A8 Other malignant neuroendocrine tumors: Secondary | ICD-10-CM

## 2024-08-09 DIAGNOSIS — C7A012 Malignant carcinoid tumor of the ileum: Secondary | ICD-10-CM | POA: Diagnosis not present

## 2024-08-11 LAB — 5 HIAA, QUANTITATIVE, URINE, 24 HOUR
5-HIAA, Ur: 1.7 mg/L
5-HIAA,Quant.,24 Hr Urine: 2.2 mg/(24.h) (ref 0.0–14.9)
Total Volume: 1300

## 2025-08-03 ENCOUNTER — Other Ambulatory Visit

## 2025-08-03 ENCOUNTER — Inpatient Hospital Stay

## 2025-08-03 ENCOUNTER — Inpatient Hospital Stay: Admitting: Oncology

## 2025-08-10 ENCOUNTER — Inpatient Hospital Stay: Admitting: Oncology

## 2025-08-10 ENCOUNTER — Inpatient Hospital Stay
